# Patient Record
Sex: Male | Born: 1945 | Race: White | Hispanic: No | Marital: Married | Smoking: Never smoker
Health system: Southern US, Community
[De-identification: ages and names within clinical notes are randomized; demographics above are authoritative.]

## PROBLEM LIST (undated history)

## (undated) DIAGNOSIS — I482 Chronic atrial fibrillation, unspecified: Secondary | ICD-10-CM

## (undated) DIAGNOSIS — I639 Cerebral infarction, unspecified: Secondary | ICD-10-CM

## (undated) DIAGNOSIS — E663 Overweight: Secondary | ICD-10-CM

## (undated) DIAGNOSIS — J302 Other seasonal allergic rhinitis: Secondary | ICD-10-CM

## (undated) DIAGNOSIS — I10 Essential (primary) hypertension: Secondary | ICD-10-CM

## (undated) DIAGNOSIS — I429 Cardiomyopathy, unspecified: Secondary | ICD-10-CM

## (undated) HISTORY — DX: Overweight: E66.3

## (undated) HISTORY — DX: Other seasonal allergic rhinitis: J30.2

## (undated) HISTORY — DX: Essential (primary) hypertension: I10

## (undated) HISTORY — DX: Chronic atrial fibrillation, unspecified: I48.20

## (undated) HISTORY — DX: Cerebral infarction, unspecified: I63.9

## (undated) HISTORY — DX: Cardiomyopathy, unspecified: I42.9

---

## 2001-02-08 LAB — HM COLONOSCOPY: HM Colonoscopy: NORMAL

## 2011-05-10 ENCOUNTER — Encounter: Payer: Self-pay | Admitting: Family Medicine

## 2011-05-10 ENCOUNTER — Ambulatory Visit (INDEPENDENT_AMBULATORY_CARE_PROVIDER_SITE_OTHER): Payer: Medicare Other | Admitting: Family Medicine

## 2011-05-10 VITALS — BP 132/80 | HR 84 | Temp 98.2°F | Resp 12 | Ht 67.0 in | Wt 209.0 lb

## 2011-05-10 DIAGNOSIS — I10 Essential (primary) hypertension: Secondary | ICD-10-CM | POA: Diagnosis not present

## 2011-05-10 DIAGNOSIS — I4891 Unspecified atrial fibrillation: Secondary | ICD-10-CM | POA: Diagnosis not present

## 2011-05-10 DIAGNOSIS — Z0000 Encounter for general adult medical examination without abnormal findings: Secondary | ICD-10-CM

## 2011-05-10 LAB — CBC WITH DIFFERENTIAL/PLATELET
Basophils Absolute: 0 10*3/uL (ref 0.0–0.1)
Eosinophils Absolute: 0.2 10*3/uL (ref 0.0–0.7)
Eosinophils Relative: 2.8 % (ref 0.0–5.0)
HCT: 45.4 % (ref 39.0–52.0)
Lymphs Abs: 1.5 10*3/uL (ref 0.7–4.0)
MCHC: 32.3 g/dL (ref 30.0–36.0)
MCV: 85.8 fl (ref 78.0–100.0)
Monocytes Absolute: 0.8 10*3/uL (ref 0.1–1.0)
Neutrophils Relative %: 66.9 % (ref 43.0–77.0)
Platelets: 192 10*3/uL (ref 150.0–400.0)
RDW: 16.5 % — ABNORMAL HIGH (ref 11.5–14.6)
WBC: 7.7 10*3/uL (ref 4.5–10.5)

## 2011-05-10 LAB — BASIC METABOLIC PANEL
Calcium: 9.3 mg/dL (ref 8.4–10.5)
GFR: 69.48 mL/min (ref >60.00)
Glucose, Bld: 95 mg/dL (ref 70–99)
Potassium: 4.9 mEq/L (ref 3.5–5.1)
Sodium: 138 mEq/L (ref 135–145)

## 2011-05-10 LAB — LDL CHOLESTEROL, DIRECT: Direct LDL: 97.4 mg/dL

## 2011-05-10 LAB — LIPID PANEL
HDL: 35 mg/dL — ABNORMAL LOW (ref >39.00)
Triglycerides: 227 mg/dL — ABNORMAL HIGH (ref 0.0–149.0)
VLDL: 45.4 mg/dL — ABNORMAL HIGH (ref 0.0–40.0)

## 2011-05-10 MED ORDER — LISINOPRIL-HYDROCHLOROTHIAZIDE 10-12.5 MG PO TABS: 1.0000 | ORAL_TABLET | Freq: Every day | ORAL | Status: DC

## 2011-05-10 MED ORDER — ATENOLOL 100 MG PO TABS: 100.0000 mg | ORAL_TABLET | Freq: Every day | ORAL | Status: DC

## 2011-05-10 MED ORDER — WARFARIN SODIUM 5 MG PO TABS: 5.0000 mg | ORAL_TABLET | Freq: Every day | ORAL | Status: DC

## 2011-05-10 NOTE — Progress Notes (Signed)
  Subjective:    Patient ID: Jose Bonilla, male    DOB: October 13, 1945, 66 y.o.   MRN: [id number]  HPI  New patient to establish care. Past medical history reviewed. Treated for several years for hypertension currently with atenolol 100 mg daily and lisinopril HCTZ 10-12.5 one daily. Blood pressure parents been well-controlled. He has occasional palpitations and sense of irregularity of heartbeat-for several years. No associated dizziness or syncope. No chest pain. He has never been diagnosed with any specific arrhythmia. He denies prior surgery. No history of CAD or peripheral vascular disease.  Patient is married. No children. Works in Armed forces training and education officer. No history of smoking. No alcohol use.  Family history significant for mother with coronary disease and father with hypertension.  Past Medical History  Diagnosis Date  . Allergy   . Hypertension    History reviewed. No pertinent past surgical history.  reports that he has never smoked. He does not have any smokeless tobacco history on file. His alcohol and drug histories not on file. family history includes Diabetes in his paternal aunt; Heart disease in his mother; and Hypertension in his father. No Known Allergies    Review of Systems  Constitutional: Negative for appetite change and unexpected weight change.  Respiratory: Negative for cough, chest tightness and shortness of breath.   Cardiovascular: Positive for palpitations. Negative for chest pain and leg swelling.  Gastrointestinal: Negative for abdominal pain and blood in stool.  Genitourinary: Negative for dysuria and hematuria.  Neurological: Negative for dizziness, syncope and weakness.  Psychiatric/Behavioral: Negative for confusion.       Objective:   Physical Exam  Constitutional: He is oriented to person, place, and time. He appears well-developed and well-nourished.  HENT:  Mouth/Throat: Oropharynx is clear and moist.  Neck: Neck supple. No  thyromegaly present.  Cardiovascular: Exam reveals no gallop.        Irregular rhythm. Rate 84 by palpation  Pulmonary/Chest: Effort normal and breath sounds normal. No respiratory distress. He has no wheezes. He has no rales.  Abdominal: Soft. There is no tenderness.  Musculoskeletal: He exhibits no edema.  Lymphadenopathy:    He has no cervical adenopathy.  Neurological: He is alert and oriented to person, place, and time. No cranial nerve deficit.  Psychiatric: He has a normal mood and affect. His behavior is normal.          Assessment & Plan:  #1 hypertension. Stable. Refill medications for one year. Suggest consider atenolol 100 mg one half tablet twice daily rather than once daily because of relatively short half-life  #2 atrial fibrillation which is a new diagnosis. This was picked up on exam and EKG. Major risk factor is hypertension. He has not had any history of congestive heart failure, stroke, or diabetes. Obtain screening labs including electrolytes and thyroid function. Cardiology referral. Consider Coumadin initiation. Reassess in 5 days.

## 2011-05-10 NOTE — Patient Instructions (Signed)
Atrial Fibrillation Your caregiver has diagnosed you with atrial fibrillation (AFib). The heart normally beats very regularly; AFib is a type of irregular heartbeat. The heart rate may be faster or slower than normal. This can prevent your heart from pumping as well as it should. AFib can be constant (chronic) or intermittent (paroxysmal). CAUSES  Atrial fibrillation may be caused by:  Heart disease, including heart attack, coronary artery disease, heart failure, diseases of the heart valves, and others.   Blood clot in the lungs (pulmonary embolism).   Pneumonia or other infections.   Chronic lung disease.   Thyroid disease.   Toxins. These include alcohol, some medications (such as decongestant medications or diet pills), and caffeine.  In some people, no cause for AFib can be found. This is referred to as Lone Atrial Fibrillation. SYMPTOMS   Palpitations or a fluttering in your chest.   A vague sense of chest discomfort.   Shortness of breath.   Sudden onset of lightheadedness or weakness.  Sometimes, the first sign of AFib can be a complication of the condition. This could be a stroke or heart failure. DIAGNOSIS  Your description of your condition may make your caregiver suspicious of atrial fibrillation. Your caregiver will examine your pulse to determine if fibrillation is present. An EKG (electrocardiogram) will confirm the diagnosis. Further testing may help determine what caused you to have atrial fibrillation. This may include chest x-ray, echocardiogram, blood tests, or CT scans. PREVENTION  If you have previously had atrial fibrillation, your caregiver may advise you to avoid substances known to cause the condition (such as stimulant medications, and possibly caffeine or alcohol). You may be advised to use medications to prevent recurrence. Proper treatment of any underlying condition is important to help prevent recurrence. PROGNOSIS  Atrial fibrillation does tend to  become a chronic condition over time. It can cause significant complications (see below). Atrial fibrillation is not usually immediately life-threatening, but it can shorten your life expectancy. This seems to be worse in women. If you have lone atrial fibrillation and are under 60 years old, the risk of complications is very low, and life expectancy is not shortened. RISKS AND COMPLICATIONS  Complications of atrial fibrillation can include stroke, chest pain, and heart failure. Your caregiver will recommend treatments for the atrial fibrillation, as well as for any underlying conditions, to help minimize risk of complications. TREATMENT  Treatment for AFib is divided into several categories:  Treatment of any underlying condition.   Converting you out of AFib into a regular (sinus) rhythm.   Controlling rapid heart rate.   Prevention of blood clots and stroke.  Medications and procedures are available to convert your atrial fibrillation to sinus rhythm. However, recent studies have shown that this may not offer you any advantage, and cardiac experts are continuing research and debate on this topic. More important is controlling your rapid heartbeat. The rapid heartbeat causes more symptoms, and places strain on your heart. Your caregiver will advise you on the use of medications that can control your heart rate. Atrial fibrillation is a strong stroke risk. You can lessen this risk by taking blood thinning medications such as Coumadin (warfarin), or sometimes aspirin. These medications need close monitoring by your caregiver. Over-medication can cause bleeding. Too little medication may not protect against stroke. HOME CARE INSTRUCTIONS   If your caregiver prescribed medicine to make your heartbeat more normally, take as directed.   If blood thinners were prescribed by your caregiver, take EXACTLY as directed.     Perform blood tests EXACTLY as directed.   Quit smoking. Smoking increases your  cardiac and lung (pulmonary) risks.   DO NOT drink alcohol.   DO NOT drink caffeinated drinks (e.g. coffee, soda, chocolate, and leaf teas). You may drink decaffeinated coffee, soda or tea.   If you are overweight, you should choose a reduced calorie diet to lose weight. Please see a registered dietitian if you need more information about healthy weight loss. DO NOT USE DIET PILLS as they may aggravate heart problems.   If you have other heart problems that are causing AFib, you may need to eat a low salt, fat, and cholesterol diet. Your caregiver will tell you if this is necessary.   Exercise every day to improve your physical fitness. Stay active unless advised otherwise.   If your caregiver has given you a follow-up appointment, it is very important to keep that appointment. Not keeping the appointment could result in heart failure or stroke. If there is any problem keeping the appointment, you must call back to this facility for assistance.  SEEK MEDICAL CARE IF:  You notice a change in the rate, rhythm or strength of your heartbeat.   You develop an infection or any other change in your overall health status.  SEEK IMMEDIATE MEDICAL CARE IF:   You develop chest pain, abdominal pain, sweating, weakness or feel sick to your stomach (nausea).   You develop shortness of breath.   You develop swollen feet and ankles.   You develop dizziness, numbness, or weakness of your face or limbs, or any change in vision or speech.  MAKE SURE YOU:   Understand these instructions.   Will watch your condition.   Will get help right away if you are not doing well or get worse.  Document Released: 01/22/2005 Document Revised: 01/11/2011 Document Reviewed: 08/27/2007 [employer] Patient Information 2012 [employer]. 

## 2011-05-11 NOTE — Progress Notes (Signed)
Quick Note:  Pt informed, copy mailed to home ______ 

## 2011-05-16 ENCOUNTER — Encounter: Payer: Self-pay | Admitting: Family Medicine

## 2011-05-16 ENCOUNTER — Ambulatory Visit (INDEPENDENT_AMBULATORY_CARE_PROVIDER_SITE_OTHER): Payer: Medicare Other | Admitting: Family Medicine

## 2011-05-16 VITALS — BP 110/80 | Temp 97.8°F | Wt 209.0 lb

## 2011-05-16 DIAGNOSIS — I4891 Unspecified atrial fibrillation: Secondary | ICD-10-CM | POA: Diagnosis not present

## 2011-05-16 DIAGNOSIS — I10 Essential (primary) hypertension: Secondary | ICD-10-CM | POA: Diagnosis not present

## 2011-05-16 NOTE — Progress Notes (Signed)
  Subjective:    Patient ID: Jose Bonilla, male    DOB: 02/24/1945, 66 y.o.   MRN: [id number]  HPI  Here for followup atrial fibrillation. Recently established as new patient last week. Incidentally noted to be in atrial fibrillation. Has had some sense of palpitations off and on in the past. Denied chest pains, dyspnea, or dizziness. EKG confirmed atrial fibrillation. Lab work was basically unremarkable. He has followup with cardiology in May. No prior history of known atrial fibrillation. He has had several years of treated hypertension. No history of diabetes.  No CHF history  Patient gives history today that he had severely elevated blood pressure about 15 years ago and had CAT scan in another city [facility]) which may have shown evidence for old CVA. He describes an event where he had some acute confusion and question of TIA prompting that CT scan. He has not had any recent symptoms whatsoever concerning for TIA or CVA. Patient has no history of known valvular heart disease or CAD history.  Past Medical History  Diagnosis Date  . Allergy   . Hypertension    No past surgical history on file.  reports that he has never smoked. He does not have any smokeless tobacco history on file. His alcohol and drug histories not on file. family history includes Diabetes in his paternal aunt; Heart disease in his mother; and Hypertension in his father. No Known Allergies    Review of Systems  Constitutional: Negative for fatigue.  Eyes: Negative for visual disturbance.  Respiratory: Negative for cough, chest tightness and shortness of breath.   Cardiovascular: Positive for palpitations. Negative for chest pain and leg swelling.  Neurological: Negative for dizziness, syncope, weakness, light-headedness and headaches.       Objective:   Physical Exam  Constitutional: He appears well-developed and well-nourished.  Neck: Neck supple. No thyromegaly present.  Cardiovascular: Exam reveals no  gallop.   No murmur heard.      Irregular rhythm but rate controlled  Pulmonary/Chest: Effort normal and breath sounds normal. No respiratory distress. He has no wheezes. He has no rales.  Musculoskeletal: He exhibits no edema.          Assessment & Plan:  Atrial fibrillation of unknown duration currently rate controlled. Initially we considered CHADs score of 1 but with hx above ?prior cerebrovascular event.  Would maintain Coumadin for now. INR 1.7 today. Increase Coumadin to 7.5 mg Monday Wednesday and Friday and 2.5 mg all other days. Recheck in one week. Dietary factors discussed. Cardiology appointment pending  Hypertension well controlled on atenolol and lisinopril HCTZ

## 2011-05-16 NOTE — Patient Instructions (Addendum)
Increase warfarin 5 mg to one and one half tablet (7.5mg ) every Monday, Wednesday, and Friday and continue 1 tablet all other days Followup in one week to repeat INR

## 2011-05-23 ENCOUNTER — Ambulatory Visit: Payer: Medicare Other

## 2011-05-30 ENCOUNTER — Ambulatory Visit (INDEPENDENT_AMBULATORY_CARE_PROVIDER_SITE_OTHER): Payer: Medicare Other

## 2011-05-30 DIAGNOSIS — I4891 Unspecified atrial fibrillation: Secondary | ICD-10-CM | POA: Diagnosis not present

## 2011-05-30 DIAGNOSIS — Z7901 Long term (current) use of anticoagulants: Secondary | ICD-10-CM

## 2011-05-30 NOTE — Patient Instructions (Signed)
  Latest dosing instructions   Total Sun Mon Tue Wed Thu Fri Sat   0                         

## 2011-06-01 ENCOUNTER — Ambulatory Visit (INDEPENDENT_AMBULATORY_CARE_PROVIDER_SITE_OTHER): Payer: Medicare Other | Admitting: Family Medicine

## 2011-06-01 DIAGNOSIS — I4891 Unspecified atrial fibrillation: Secondary | ICD-10-CM | POA: Diagnosis not present

## 2011-06-01 NOTE — Patient Instructions (Addendum)
  Latest dosing instructions   Total Sun Mon Tue Wed Thu Fri Sat   35 5 mg 5 mg 5 mg 5 mg 5 mg 5 mg 5 mg    (5 mg1) (5 mg1) (5 mg1) (5 mg1) (5 mg1) (5 mg1) (5 mg1)        

## 2011-06-06 ENCOUNTER — Ambulatory Visit (INDEPENDENT_AMBULATORY_CARE_PROVIDER_SITE_OTHER): Payer: Medicare Other | Admitting: Family Medicine

## 2011-06-06 DIAGNOSIS — I4891 Unspecified atrial fibrillation: Secondary | ICD-10-CM | POA: Diagnosis not present

## 2011-06-06 NOTE — Patient Instructions (Signed)
  Latest dosing instructions   Total Sun Mon Tue Wed Thu Fri Sat   27.5 5 mg 2.5 mg 5 mg 2.5 mg 5 mg 2.5 mg 5 mg    (5 mg1) (5 mg0.5) (5 mg1) (5 mg0.5) (5 mg1) (5 mg0.5) (5 mg1)        

## 2011-06-11 ENCOUNTER — Telehealth: Payer: Self-pay | Admitting: Family Medicine

## 2011-06-11 ENCOUNTER — Ambulatory Visit (INDEPENDENT_AMBULATORY_CARE_PROVIDER_SITE_OTHER): Payer: Medicare Other | Admitting: Family Medicine

## 2011-06-11 DIAGNOSIS — M109 Gout, unspecified: Secondary | ICD-10-CM

## 2011-06-11 MED ORDER — LISINOPRIL 20 MG PO TABS: 20.0000 mg | ORAL_TABLET | Freq: Every day | ORAL | Status: DC

## 2011-06-11 MED ORDER — PREDNISONE 10 MG PO TABS: ORAL_TABLET | ORAL | Status: DC

## 2011-06-11 NOTE — Patient Instructions (Signed)
Gout Gout is an inflammatory condition (arthritis) caused by a buildup of uric acid crystals in the joints. Uric acid is a chemical that is normally present in the blood. Under some circumstances, uric acid can form into crystals in your joints. This causes joint redness, soreness, and swelling (inflammation). Repeat attacks are common. Over time, uric acid crystals can form into masses (tophi) near a joint, causing disfigurement. Gout is treatable and often preventable. CAUSES  The disease begins with elevated levels of uric acid in the blood. Uric acid is produced by your body when it breaks down a naturally found substance called purines. This also happens when you eat certain foods such as meats and fish. Causes of an elevated uric acid level include:  Being passed down from parent to child (heredity).   Diseases that cause increased uric acid production (obesity, psoriasis, some cancers).   Excessive alcohol use.   Diet, especially diets rich in meat and seafood.   Medicines, including certain cancer-fighting drugs (chemotherapy), diuretics, and aspirin.   Chronic kidney disease. The kidneys are no longer able to remove uric acid well.   Problems with metabolism.  Conditions strongly associated with gout include:  Obesity.   High blood pressure.   High cholesterol.   Diabetes.  Not everyone with elevated uric acid levels gets gout. It is not understood why some people get gout and others do not. Surgery, joint injury, and eating too much of certain foods are some of the factors that can lead to gout. SYMPTOMS   An attack of gout comes on quickly. It causes intense pain with redness, swelling, and warmth in a joint.   Fever can occur.   Often, only one joint is involved. Certain joints are more commonly involved:   Base of the big toe.   Knee.   Ankle.   Wrist.   Finger.  Without treatment, an attack usually goes away in a few days to weeks. Between attacks, you  usually will not have symptoms, which is different from many other forms of arthritis. DIAGNOSIS  Your caregiver will suspect gout based on your symptoms and exam. Removal of fluid from the joint (arthrocentesis) is done to check for uric acid crystals. Your caregiver will give you a medicine that numbs the area (local anesthetic) and use a needle to remove joint fluid for exam. Gout is confirmed when uric acid crystals are seen in joint fluid, using a special microscope. Sometimes, blood, urine, and X-ray tests are also used. TREATMENT  There are 2 phases to gout treatment: treating the sudden onset (acute) attack and preventing attacks (prophylaxis). Treatment of an Acute Attack  Medicines are used. These include anti-inflammatory medicines or steroid medicines.   An injection of steroid medicine into the affected joint is sometimes necessary.   The painful joint is rested. Movement can worsen the arthritis.   You may use warm or cold treatments on painful joints, depending which works best for you.   Discuss the use of coffee, vitamin C, or cherries with your caregiver. These may be helpful treatment options.  Treatment to Prevent Attacks After the acute attack subsides, your caregiver may advise prophylactic medicine. These medicines either help your kidneys eliminate uric acid from your body or decrease your uric acid production. You may need to stay on these medicines for a very long time. The early phase of treatment with prophylactic medicine can be associated with an increase in acute gout attacks. For this reason, during the first few months   of treatment, your caregiver may also advise you to take medicines usually used for acute gout treatment. Be sure you understand your caregiver's directions. You should also discuss dietary treatment with your caregiver. Certain foods such as meats and fish can increase uric acid levels. Other foods such as dairy can decrease levels. Your caregiver  can give you a list of foods to avoid. HOME CARE INSTRUCTIONS   Do not take aspirin to relieve pain. This raises uric acid levels.   Only take over-the-counter or prescription medicines for pain, discomfort, or fever as directed by your caregiver.   Rest the joint as much as possible. When in bed, keep sheets and blankets off painful areas.   Keep the affected joint raised (elevated).   Use crutches if the painful joint is in your leg.   Drink enough water and fluids to keep your urine clear or pale yellow. This helps your body get rid of uric acid. Do not drink alcoholic beverages. They slow the passage of uric acid.   Follow your caregiver's dietary instructions. Pay careful attention to the amount of protein you eat. Your daily diet should emphasize fruits, vegetables, whole grains, and fat-free or low-fat milk products.   Maintain a healthy body weight.  SEEK MEDICAL CARE IF:   You have an oral temperature above 102 F (38.9 C).   You develop diarrhea, vomiting, or any side effects from medicines.   You do not feel better in 24 hours, or you are getting worse.  SEEK IMMEDIATE MEDICAL CARE IF:   Your joint becomes suddenly more tender and you have:   Chills.   An oral temperature above 102 F (38.9 C), not controlled by medicine.  MAKE SURE YOU:   Understand these instructions.   Will watch your condition.   Will get help right away if you are not doing well or get worse.  Document Released: 01/20/2000 Document Revised: 01/11/2011 Document Reviewed: 05/02/2009 [employer] Patient Information 2012 [employer]. 

## 2011-06-11 NOTE — Telephone Encounter (Signed)
Flair up of pain in Let wrist and is interested in a referral. Please contact

## 2011-06-11 NOTE — Telephone Encounter (Signed)
Please see if pt would be willing to return for OV to re-evaluate and refer if needed

## 2011-06-11 NOTE — Progress Notes (Signed)
  Subjective:    Patient ID: Jose Bonilla, male    DOB: 1945-07-01, 66 y.o.   MRN: [id number]  HPI  Acute visit. Left wrist pain and swelling for the past week. No injury. Denies any fever or chills. He relates prior history of gout. Has had previous elbow involvement and foot involvement and apparently similar left wrist involvement over a year ago. Was seen then by orthopedist and received steroid injection and this eventually improved. Pain is moderate. Has not taken any nonsteroidals other than occasional Advil. He takes lisinopril HCTZ for hypertension and has been on this for quite some time.  No dietary changes. No alcohol.  Recently diagnosed atrial fibrillation. On Coumadin. Recent INR 3.3 with adjustment in Coumadin dose. No bleeding complications. Denies any chest pains, dyspnea, or dizziness. No palpitations. Cardiology followup pending.  Past Medical History  Diagnosis Date  . Allergy   . Hypertension    No past surgical history on file.  reports that he has never smoked. He does not have any smokeless tobacco history on file. His alcohol and drug histories not on file. family history includes Diabetes in his paternal aunt; Heart disease in his mother; and Hypertension in his father. No Known Allergies    Review of Systems  Constitutional: Negative for fever and chills.  Respiratory: Negative for cough and shortness of breath.   Cardiovascular: Negative for chest pain, palpitations and leg swelling.  Hematological: Does not bruise/bleed easily.       Objective:   Physical Exam  Constitutional: He appears well-developed and well-nourished.  Cardiovascular: Normal rate.        Irregularly irregular rhythm  Pulmonary/Chest: Effort normal and breath sounds normal. No respiratory distress. He has no wheezes. He has no rales.  Musculoskeletal:       Patient has diffuse swelling of the left wrist with mild warmth but no erythema. He has some edema extending into the dorsum of  the left hand. Minimally tender to palpation of the wrist. He has some left olecranon swelling with possibly some early tophaceous changes no cellulitis changes. Elbow is nontender          Assessment & Plan:  Acute monoarticular arthritis left wrist.  Very likely gout. No evidence for infection such as cellulitis. Discontinue lisinopril HCTZ and change to lisinopril 20 mg (eliminating HCTZ which could trigger gout). Prednisone taper over 8 days. May need to consider medication such as Uloric if continues to have frequent flareups

## 2011-06-12 ENCOUNTER — Encounter: Payer: Self-pay | Admitting: Family Medicine

## 2011-06-19 ENCOUNTER — Telehealth: Payer: Self-pay | Admitting: *Deleted

## 2011-06-19 ENCOUNTER — Ambulatory Visit (INDEPENDENT_AMBULATORY_CARE_PROVIDER_SITE_OTHER): Payer: Medicare Other | Admitting: Cardiology

## 2011-06-19 ENCOUNTER — Encounter: Payer: Self-pay | Admitting: Cardiology

## 2011-06-19 VITALS — BP 119/83 | HR 81 | Resp 18 | Ht 68.0 in | Wt 225.0 lb

## 2011-06-19 DIAGNOSIS — I4891 Unspecified atrial fibrillation: Secondary | ICD-10-CM

## 2011-06-19 DIAGNOSIS — I10 Essential (primary) hypertension: Secondary | ICD-10-CM

## 2011-06-19 DIAGNOSIS — M1A9XX0 Chronic gout, unspecified, without tophus (tophi): Secondary | ICD-10-CM | POA: Insufficient documentation

## 2011-06-19 DIAGNOSIS — Z7901 Long term (current) use of anticoagulants: Secondary | ICD-10-CM

## 2011-06-19 DIAGNOSIS — I639 Cerebral infarction, unspecified: Secondary | ICD-10-CM

## 2011-06-19 DIAGNOSIS — I6350 Cerebral infarction due to unspecified occlusion or stenosis of unspecified cerebral artery: Secondary | ICD-10-CM

## 2011-06-19 DIAGNOSIS — E663 Overweight: Secondary | ICD-10-CM | POA: Insufficient documentation

## 2011-06-19 DIAGNOSIS — R609 Edema, unspecified: Secondary | ICD-10-CM | POA: Insufficient documentation

## 2011-06-19 MED ORDER — DILTIAZEM HCL ER COATED BEADS 120 MG PO CP24: 120.0000 mg | ORAL_CAPSULE | Freq: Every day | ORAL | Status: DC

## 2011-06-19 NOTE — Assessment & Plan Note (Signed)
Patient was placed on Coumadin in April, 2013 when his diagnosis of atrial fib was made. It will be most appropriate for him to get his INRs checked in our Coumadin clinic as it is close to his home. As I assess him further we will decide if cardioversion is to be undertaken.

## 2011-06-19 NOTE — Assessment & Plan Note (Addendum)
We have no idea how long he is in atrial fibrillation. Some of his current symptoms may be related increased atrial fibrillation rates. Going add a small dose of diltiazem today to see how he feels. Hold and assess him further and make decisions about leaving him in atrial fib or trying to convert him. Two-dimensional echo will be done to assess LV function along with valvular function.

## 2011-06-19 NOTE — Progress Notes (Signed)
HPI   Patient is seen today as a new patient evaluation for atrial fibrillation. The patient was seen Dr. Caryl Never to establish as a new primary care patient. It was noted that he had atrial fibrillation. He was started on Coumadin and plans were made for him to be seen here. He actually lives in [place]. He is self-employed. He gives a history of the question of an old CVA documented by CT scan of the head in 2001. He is not aware of any prior EKGs that would show atrial fibrillation. His TSH was checked recently and it is normal. We do not have any data concerning his LV function and valvular function at this time. He has not had PND orthopnea. He does note that when he tries to do any type of physical exercise that he notes an increase in his heart rate and he becomes fatigued. He cannot say how long this has been going on.  As part of today's evaluation I have reviewed the old records available including the notes from his primary physician.  Very recently he had gout in the left hand.. He's been treated with steroids with the last dose yesterday. He did develop edema associated with the timing of the steroids.   No Known Allergies  Current Outpatient Prescriptions  Medication Sig Dispense Refill  . atenolol (TENORMIN) 100 MG tablet Take 1 tablet (100 mg total) by mouth daily.  90 tablet  3  . lisinopril-hydrochlorothiazide (PRINZIDE,ZESTORETIC) 10-12.5 MG per tablet Take 1 tablet by mouth daily.      Marland Kitchen warfarin (COUMADIN) 5 MG tablet Take 1 tablet (5 mg total) by mouth daily.  30 tablet  3    History   Social History  . Marital Status: Married    Spouse Name: N/A    Number of Children: N/A  . Years of Education: N/A   Occupational History  . Not on file.   Social History Main Topics  . Smoking status: Never Smoker   . Smokeless tobacco: Not on file  . Alcohol Use: Not on file  . Drug Use: Not on file  . Sexually Active: Not on file   Other Topics Concern  . Not on file    Social History Narrative  . No narrative on file    Family History  Problem Relation Age of Onset  . Heart disease Mother   . Hypertension Father   . Diabetes Paternal Aunt     Past Medical History  Diagnosis Date  . Allergy   . Hypertension   . Warfarin anticoagulation     Atrial fibrillation  . Atrial fibrillation     Coumadin therapy  . CVA (cerebral infarction)     Question  CT scan elsewhere  with possible old CVA ,  CT scan done  for acute confusion and question of TIA in the past  . Gout     Acute episode May, 2013    No past surgical history on file.  ROS   Patient denies fever, chills, headache, sweats, rash, change in vision, change in hearing, chest pain, cough, nausea vomiting, urinary symptoms. All other systems are reviewed and are negative.  PHYSICAL EXAM   Patient is overweight. He is oriented to person time and place. Affect is normal. There is no jugulovenous distention. There no carotid bruits. Lungs are clear. Respiratory effort is nonlabored. Cardiac exam reveals that his rhythm is irregularly irregular. The abdomen is soft.  He does have some peripheral edema. There are  no musculoskeletal deformities. There are no skin rashes.  Filed Vitals:   06/19/11 1419  BP: 119/83  Pulse: 81  Resp: 18  Height: 5\' 8"  (1.727 m)  Weight: 225 lb (102.059 kg)   I personally reviewed the EKG dated May 10, 2011. This did show atrial fibrillation.  ASSESSMENT & PLAN

## 2011-06-19 NOTE — Assessment & Plan Note (Signed)
The CT scan question an old CVA. This CT scan was done 2001. I am presuming that atrial fibrillation was not seen at that time. However  He could of been having paroxysmal atrial fib back then. Certainly Coumadin is indicated.

## 2011-06-19 NOTE — Assessment & Plan Note (Signed)
Blood pressure is under reasonable control. No change in therapy for blood pressure at this time.

## 2011-06-19 NOTE — Assessment & Plan Note (Signed)
He has some edema now. He thinks that developed when he went on the prednisone. It is over stopped yesterday. I will not change his meds at this time. He does have a small dose of diuretic in his current meds.

## 2011-06-19 NOTE — Telephone Encounter (Signed)
MD informed and said it was okay for patient to take the both of them. [facility] pharmacist informed.

## 2011-06-19 NOTE — Assessment & Plan Note (Signed)
He definitely needs to lose weight in general.

## 2011-06-19 NOTE — Patient Instructions (Signed)
Your physician recommends that you schedule a follow-up appointment in: 3-4 weeks with Dr. Myrtis Ser. You will be given an appointment at the check out desk today.   Your physician has recommended you make the following change in your medication: START DILTIAZEM CD 120 MG DAILY. Your new prescription has been sent to your pharmacy.  You have been referred to our Coumadin Clinic. You will be given an appointment for this at the check out desk today.  Your physician has requested that you have an echocardiogram. Echocardiography is a painless test that uses sound waves to create images of your heart. It provides your doctor with information about the size and shape of your heart and how well your heart's chambers and valves are working. This procedure takes approximately one hour. There are no restrictions for this procedure.

## 2011-06-20 ENCOUNTER — Ambulatory Visit: Payer: Medicare Other

## 2011-06-20 ENCOUNTER — Ambulatory Visit: Payer: Medicare Other | Admitting: Family Medicine

## 2011-06-21 ENCOUNTER — Encounter: Payer: Self-pay | Admitting: Family Medicine

## 2011-06-21 ENCOUNTER — Ambulatory Visit (INDEPENDENT_AMBULATORY_CARE_PROVIDER_SITE_OTHER): Payer: Medicare Other | Admitting: Family Medicine

## 2011-06-21 VITALS — BP 120/90 | Temp 97.8°F | Wt 226.0 lb

## 2011-06-21 DIAGNOSIS — Z7901 Long term (current) use of anticoagulants: Secondary | ICD-10-CM | POA: Diagnosis not present

## 2011-06-21 DIAGNOSIS — M109 Gout, unspecified: Secondary | ICD-10-CM

## 2011-06-21 DIAGNOSIS — I4891 Unspecified atrial fibrillation: Secondary | ICD-10-CM | POA: Diagnosis not present

## 2011-06-21 NOTE — Progress Notes (Signed)
  Subjective:    Patient ID: Jose Bonilla, male    DOB: 11/25/1945, 66 y.o.   MRN: [id number]  HPI  Patient seen initially for INR check. He explains had some generalized weakness. Was recent seen by cardiologist with addition of diltiazem to atenolol. He also takes lisinopril HCTZ for hypertension. No orthostasis. We recently reduced his Coumadin dosage. His INR today is back up to 8 and he states he is taking Coumadin as instructed. He was placed on prednisone recently for gout flare but no recent antibiotics. No bleeding complications. Patient denies any headaches, abdominal pain, hematuria, bloody stools. No dizziness. Generally more fatigued. No chest pain. No dyspnea. Recent lab work including thyroid function unremarkable. He is scheduled for echocardiogram later this month.  Recently had painful swollen left wrist with suspected gout. This improved promptly with prednisone.  Past Medical History  Diagnosis Date  . Allergy   . Hypertension   . Warfarin anticoagulation     Atrial fibrillation  . Atrial fibrillation     Coumadin therapy  . CVA (cerebral infarction)     Question  CT scan elsewhere  with possible old CVA ,  CT scan done  for acute confusion and question of TIA in the past  . Gout     Acute episode May, 2013  . Overweight   . Edema     May, 2013   No past surgical history on file.  reports that he has never smoked. He does not have any smokeless tobacco history on file. His alcohol and drug histories not on file. family history includes Diabetes in his paternal aunt; Heart disease in his mother; and Hypertension in his father. No Known Allergies    Review of Systems  Constitutional: Positive for fatigue. Negative for fever and chills.  Respiratory: Negative for cough and shortness of breath.   Cardiovascular: Negative for chest pain.  Gastrointestinal: Negative for abdominal pain and blood in stool.  Genitourinary: Negative for hematuria.  Neurological:  Negative for dizziness, syncope, weakness and headaches.  Hematological: Negative for adenopathy. Does not bruise/bleed easily.       Objective:   Physical Exam  Constitutional: He is oriented to person, place, and time. He appears well-developed and well-nourished.  Neck: Neck supple. No thyromegaly present.  Cardiovascular: Normal rate and regular rhythm.   Pulmonary/Chest: Effort normal and breath sounds normal. No respiratory distress. He has no wheezes. He has no rales.  Musculoskeletal:       L olecranon bursa edema but no warmth.  Tophaceous changes.  Neurological: He is alert and oriented to person, place, and time.  Skin: No rash noted.          Assessment & Plan:  # atrial fibrillation. Rate controlled. Coumadin is supratherapeutic. We will hold Coumadin altogether until Monday and recheck then. #2 gout. Improved following prednisone.  Discussed possible discontinuation of HCTZ but he is reluctant b/o chronic edema issues.  May need to consider allopurinol or uloric. #3 generalized fatigue. Patient has questions today regarding further cardiac testing. Denies specific chest pain but has concerns regarding coronary artery disease. Will discuss with cardiologist.  He is already scheduled for ECHO.

## 2011-06-21 NOTE — Patient Instructions (Signed)
HOLD Coumadin until Monday and repeat INR on Monday

## 2011-06-22 ENCOUNTER — Encounter: Payer: Self-pay | Admitting: Physician Assistant

## 2011-06-25 ENCOUNTER — Encounter: Payer: Medicare Other | Admitting: Family

## 2011-06-26 ENCOUNTER — Telehealth: Payer: Self-pay | Admitting: Cardiology

## 2011-06-26 NOTE — Telephone Encounter (Signed)
Jose Bonilla has echo scheduled for this week. I have mailed a letter to patient requesting that he call the office So that the echo can be re-scheduled. Also, tried calling both telephone numbers we have on file. No mail box Has been set up.

## 2011-06-27 ENCOUNTER — Ambulatory Visit (INDEPENDENT_AMBULATORY_CARE_PROVIDER_SITE_OTHER): Payer: Medicare Other | Admitting: Family

## 2011-06-27 DIAGNOSIS — I4891 Unspecified atrial fibrillation: Secondary | ICD-10-CM | POA: Diagnosis not present

## 2011-06-27 NOTE — Patient Instructions (Signed)
  Latest dosing instructions   Total Sun Mon Tue Wed Thu Fri Sat   17.5 2.5 mg 2.5 mg 2.5 mg 2.5 mg 2.5 mg 2.5 mg 2.5 mg    (5 mg0.5) (5 mg0.5) (5 mg0.5) (5 mg0.5) (5 mg0.5) (5 mg0.5) (5 mg0.5)        

## 2011-06-28 ENCOUNTER — Other Ambulatory Visit: Payer: Medicare Other

## 2011-07-04 ENCOUNTER — Ambulatory Visit (INDEPENDENT_AMBULATORY_CARE_PROVIDER_SITE_OTHER): Payer: Medicare Other | Admitting: Family

## 2011-07-04 DIAGNOSIS — I4891 Unspecified atrial fibrillation: Secondary | ICD-10-CM | POA: Diagnosis not present

## 2011-07-04 LAB — POCT INR: INR: 1.6

## 2011-07-04 NOTE — Patient Instructions (Signed)
  Latest dosing instructions   Total Sun Mon Tue Wed Thu Fri Sat   22.5 2.5 mg 2.5 mg 5 mg 2.5 mg 5 mg 2.5 mg 2.5 mg    (5 mg0.5) (5 mg0.5) (5 mg1) (5 mg0.5) (5 mg1) (5 mg0.5) (5 mg0.5)      Take 5 mg today. Take 5 mg Tues and Thurs then take 2.5 mg every other day. Check in 1 week.

## 2011-07-11 ENCOUNTER — Ambulatory Visit: Payer: Medicare Other | Admitting: Family

## 2011-07-13 ENCOUNTER — Ambulatory Visit (INDEPENDENT_AMBULATORY_CARE_PROVIDER_SITE_OTHER): Payer: Medicare Other | Admitting: Family

## 2011-07-13 DIAGNOSIS — I4891 Unspecified atrial fibrillation: Secondary | ICD-10-CM | POA: Diagnosis not present

## 2011-07-13 LAB — POCT INR: INR: 2.8

## 2011-07-13 NOTE — Patient Instructions (Signed)
  Latest dosing instructions   Total Sun Mon Tue Wed Thu Fri Sat   22.5 2.5 mg 2.5 mg 5 mg 2.5 mg 5 mg 2.5 mg 2.5 mg    (5 mg0.5) (5 mg0.5) (5 mg1) (5 mg0.5) (5 mg1) (5 mg0.5) (5 mg0.5)        Take 5 mg Tues and Thurs.  Then take 2.5 mg every other day. Check in 3 week.

## 2011-07-19 ENCOUNTER — Other Ambulatory Visit: Payer: Self-pay

## 2011-07-19 ENCOUNTER — Other Ambulatory Visit (INDEPENDENT_AMBULATORY_CARE_PROVIDER_SITE_OTHER): Payer: Medicare Other

## 2011-07-19 DIAGNOSIS — I4891 Unspecified atrial fibrillation: Secondary | ICD-10-CM

## 2011-07-23 ENCOUNTER — Encounter: Payer: Self-pay | Admitting: Cardiology

## 2011-07-23 DIAGNOSIS — R9430 Abnormal result of cardiovascular function study, unspecified: Secondary | ICD-10-CM | POA: Insufficient documentation

## 2011-07-23 DIAGNOSIS — IMO0002 Reserved for concepts with insufficient information to code with codable children: Secondary | ICD-10-CM | POA: Insufficient documentation

## 2011-07-27 ENCOUNTER — Telehealth: Payer: Self-pay | Admitting: Cardiology

## 2011-07-27 NOTE — Telephone Encounter (Signed)
Has some questions regarding his medications and how he is feeling.  He says that he is feeling much better.

## 2011-07-30 NOTE — Telephone Encounter (Signed)
Returned call to patient and he wanted to get an appointment with Myrtis Ser to discuss recent test and medications.  Nurse called [facility]. Office and was given appointment for this Thursday 06/27 @2 :30 pm. Patient informed.

## 2011-08-02 ENCOUNTER — Ambulatory Visit (INDEPENDENT_AMBULATORY_CARE_PROVIDER_SITE_OTHER): Payer: Medicare Other | Admitting: Cardiology

## 2011-08-02 ENCOUNTER — Encounter: Payer: Self-pay | Admitting: Cardiology

## 2011-08-02 VITALS — BP 126/90 | HR 102 | Ht 68.0 in | Wt 207.0 lb

## 2011-08-02 DIAGNOSIS — I4891 Unspecified atrial fibrillation: Secondary | ICD-10-CM | POA: Diagnosis not present

## 2011-08-02 DIAGNOSIS — Z7901 Long term (current) use of anticoagulants: Secondary | ICD-10-CM

## 2011-08-02 DIAGNOSIS — I10 Essential (primary) hypertension: Secondary | ICD-10-CM

## 2011-08-02 DIAGNOSIS — R609 Edema, unspecified: Secondary | ICD-10-CM | POA: Diagnosis not present

## 2011-08-02 DIAGNOSIS — R0989 Other specified symptoms and signs involving the circulatory and respiratory systems: Secondary | ICD-10-CM

## 2011-08-02 DIAGNOSIS — R9430 Abnormal result of cardiovascular function study, unspecified: Secondary | ICD-10-CM

## 2011-08-02 DIAGNOSIS — IMO0002 Reserved for concepts with insufficient information to code with codable children: Secondary | ICD-10-CM

## 2011-08-02 MED ORDER — DILTIAZEM HCL ER COATED BEADS 180 MG PO CP24: 180.0000 mg | ORAL_CAPSULE | Freq: Every day | ORAL | Status: DC

## 2011-08-02 NOTE — Assessment & Plan Note (Signed)
Blood pressure is controlled

## 2011-08-02 NOTE — Assessment & Plan Note (Signed)
His edema is greatly improved. I suspect this is related to better rate control with his atrial fib and the low dose diuretic.

## 2011-08-02 NOTE — Assessment & Plan Note (Signed)
Atrial fibrillation rate is under better control. I still want to push his no chiasm from 120-180 mg daily. We do not know how long he has had it for fibrillation. I explained to him that I would recommend proceeding with cardioversion on one occasion. He did not seem interested in discussing this issue. He said that he was pleased that he had diuresis.

## 2011-08-02 NOTE — Assessment & Plan Note (Signed)
The echo at this point suggest decreased left ventricular function. Etiology is not clear. We need to proceed with a pharmacologic nuclear scan to be sure that there is not evidence of coronary artery disease. This will be scheduled and I will see him back.

## 2011-08-02 NOTE — Progress Notes (Signed)
HPI  Patient returns for the further evaluation of atrial fibrillation. I saw him in consultation Jun 19, 2011. He took fibrillation had been noted. I decided to put him on diltiazem for rate control and to obtain a 2-D echo. The echo was technically difficult. There is question that the ejection fraction may be in the 40-45% range. However, it is possible that the technical difficulties in the atrial fib are affecting the ability to assess the LV function. Ejection fraction may possibly be better. However this result raises the question of the possibility of coronary disease.  With better rate control the patient actually diuresed substantially.  No Known Allergies  Current Outpatient Prescriptions  Medication Sig Dispense Refill  . atenolol (TENORMIN) 100 MG tablet Take 1 tablet (100 mg total) by mouth daily.  90 tablet  3  . diltiazem (CARDIZEM CD) 120 MG 24 hr capsule Take 1 capsule (120 mg total) by mouth daily.  30 capsule  3  . lisinopril-hydrochlorothiazide (PRINZIDE,ZESTORETIC) 10-12.5 MG per tablet Take 1 tablet by mouth daily.      Marland Kitchen warfarin (COUMADIN) 5 MG tablet Take 1 tablet (5 mg total) by mouth daily.  30 tablet  3    History   Social History  . Marital Status: Married    Spouse Name: N/A    Number of Children: N/A  . Years of Education: N/A   Occupational History  . Not on file.   Social History Main Topics  . Smoking status: Never Smoker   . Smokeless tobacco: Not on file  . Alcohol Use: Not on file  . Drug Use: Not on file  . Sexually Active: Not on file   Other Topics Concern  . Not on file   Social History Narrative  . No narrative on file    Family History  Problem Relation Age of Onset  . Heart disease Mother   . Hypertension Father   . Diabetes Paternal Aunt     Past Medical History  Diagnosis Date  . Allergy   . Hypertension   . Warfarin anticoagulation     Atrial fibrillation  . Atrial fibrillation     Coumadin therapy  . CVA  (cerebral infarction)     Question  CT scan elsewhere  with possible old CVA ,  CT scan done  for acute confusion and question of TIA in the past  . Gout     Acute episode May, 2013  . Overweight   . Edema     May, 2013  . Ejection fraction < 50%     EF 40-45%, echo, June, 2013, mild RV dysfunction    No past surgical history on file.  ROS   Patient denies fever, chills, headache, sweats, rash, change in vision, change in hearing, chest pain, cough, nausea vomiting, urinary symptoms. All other systems are reviewed and are negative.  PHYSICAL EXAM  Patient is oriented to person time and place. Affect is normal. He is very pleased that he has lost weight by diuresing. Lungs are clear. Respiratory effort is not labored. There is no jugulovenous distention. Cardiac exam reveals that the rhythm is irregularly irregular. There is an S1 and S2. There no clicks or significant murmurs. The abdomen is soft. His legs are thick but there is no definite edema.There no musculoskeletal deformities. There are no skin rashes.  Filed Vitals:   08/02/11 1449  BP: 126/90  Pulse: 102  Height: 5\' 8"  (1.727 m)  Weight: 207 lb (93.895 kg)  EKG is done today and reviewed by me. He has atrial fibrillation. The rate today is approximately 100.  ASSESSMENT & PLAN

## 2011-08-02 NOTE — Assessment & Plan Note (Signed)
The patient's Coumadin is being continued. We know that his INR was 2.7 several weeks ago. He will be having his INR checked again tomorrow.

## 2011-08-02 NOTE — Patient Instructions (Addendum)
Your physician has requested that you have a lexiscan myoview. For further information please visit [email or web address] Please follow instruction sheet, as given.  Your physician has recommended you make the following change in your medication: Increase your cardizem to 180mg  daily  Your physician recommends that you schedule a follow-up appointment in: 6-8 weeks

## 2011-08-03 ENCOUNTER — Ambulatory Visit (INDEPENDENT_AMBULATORY_CARE_PROVIDER_SITE_OTHER): Payer: Medicare Other | Admitting: Family

## 2011-08-03 DIAGNOSIS — Z7901 Long term (current) use of anticoagulants: Secondary | ICD-10-CM

## 2011-08-03 DIAGNOSIS — I4891 Unspecified atrial fibrillation: Secondary | ICD-10-CM | POA: Diagnosis not present

## 2011-08-03 LAB — POCT INR: INR: 2.9

## 2011-08-03 NOTE — Patient Instructions (Signed)
Take 5 mg Tues and Thurs.  Then take 2.5 mg every other day. Check in 4 week.    Latest dosing instructions   Total Sun Mon Tue Wed Thu Fri Sat   22.5 2.5 mg 2.5 mg 5 mg 2.5 mg 5 mg 2.5 mg 2.5 mg    (5 mg0.5) (5 mg0.5) (5 mg1) (5 mg0.5) (5 mg1) (5 mg0.5) (5 mg0.5)

## 2011-08-06 ENCOUNTER — Telehealth: Payer: Self-pay | Admitting: Cardiology

## 2011-08-06 NOTE — Telephone Encounter (Signed)
New Problem:    Patient wants you to give him a call to discuss his diagnosis.  Please call back.

## 2011-08-07 NOTE — Telephone Encounter (Signed)
There is nothing else that I can tell the patient to explain any further until we see how he does with the higher dose of diltiazem and we get the results of his nuclear scan. Before you call the patient, please call me

## 2011-08-07 NOTE — Telephone Encounter (Signed)
Pt is concerned that Dr Myrtis Ser told him to ignore everything he was told regarding the results of his echocardiogram.  He wants to know what Dr Myrtis Ser meant by this statement?  He also wants to know if he has CHF.

## 2011-08-07 NOTE — Telephone Encounter (Signed)
Pt was notified that Dr Myrtis Ser ordered the nuclear test to better assess the LV function of his heart.  He was also told that Dr Myrtis Ser is pleased that he is responding well to the Diltiazem and is doing better.

## 2011-08-22 ENCOUNTER — Ambulatory Visit: Payer: Medicare Other | Admitting: Family Medicine

## 2011-08-27 ENCOUNTER — Other Ambulatory Visit (HOSPITAL_COMMUNITY): Payer: Medicare Other

## 2011-08-31 ENCOUNTER — Ambulatory Visit (INDEPENDENT_AMBULATORY_CARE_PROVIDER_SITE_OTHER): Payer: Medicare Other | Admitting: Family

## 2011-08-31 DIAGNOSIS — I4891 Unspecified atrial fibrillation: Secondary | ICD-10-CM | POA: Diagnosis not present

## 2011-08-31 LAB — POCT INR: INR: 3.8

## 2011-08-31 NOTE — Patient Instructions (Addendum)
Hold Coumadin today and tomorrow. Resume on Sunday. Then Continue to take 5 mg Tues and Thurs.  Then take 2.5 mg every other day. Check in 2 week.     Latest dosing instructions   Total Sun Mon Tue Wed Thu Fri Sat   22.5 2.5 mg 2.5 mg 5 mg 2.5 mg 5 mg 2.5 mg 2.5 mg    (5 mg0.5) (5 mg0.5) (5 mg1) (5 mg0.5) (5 mg1) (5 mg0.5) (5 mg0.5)

## 2011-09-05 ENCOUNTER — Ambulatory Visit (INDEPENDENT_AMBULATORY_CARE_PROVIDER_SITE_OTHER): Payer: Medicare Other | Admitting: Family Medicine

## 2011-09-05 ENCOUNTER — Encounter: Payer: Self-pay | Admitting: Family Medicine

## 2011-09-05 VITALS — BP 120/78 | Temp 98.2°F | Wt 212.0 lb

## 2011-09-05 DIAGNOSIS — I10 Essential (primary) hypertension: Secondary | ICD-10-CM

## 2011-09-05 DIAGNOSIS — M109 Gout, unspecified: Secondary | ICD-10-CM

## 2011-09-05 DIAGNOSIS — I4891 Unspecified atrial fibrillation: Secondary | ICD-10-CM

## 2011-09-05 NOTE — Progress Notes (Signed)
  Subjective:    Patient ID: Jose Bonilla, male    DOB: Jul 03, 1945, 66 y.o.   MRN: [id number]  HPI  Medical patient was noted to be in atrial fibrillation when first presented here. He is maintained on Coumadin as well as beta blocker and diltiazem. Rate has been well controlled. Extreme fatigue and edema issues previously and placed on low-dose diuretic with HCTZ and with better rate controlled A. fib overall feels better. No chest pains. Recent ejection fraction 45-50% and nuclear stress test pending. Maintained on Coumadin with recent slightly supratherapeutic INR 3.8. No bleeding complications.  No recent exertional chest pain.  History of gout. Had left wrist involvement recently. He has chronic probably tophaceous changes left olecranon bursa. 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 No other tophaceous changes. He is careful about diet and foods that trigger gout.  Past Medical History  Diagnosis Date  . Allergy   . Hypertension   . Warfarin anticoagulation     Atrial fibrillation  . Atrial fibrillation     Coumadin therapy  . CVA (cerebral infarction)     Question  CT scan elsewhere  with possible old CVA ,  CT scan done  for acute confusion and question of TIA in the past  . Gout     Acute episode May, 2013  . Overweight   . Edema     May, 2013  . Ejection fraction < 50%     EF 40-45%, echo, June, 2013, mild RV dysfunction   No past surgical history on file.  reports that he has never smoked. He does not have any smokeless tobacco history on file. His alcohol and drug histories not on file. family history includes Diabetes in his paternal aunt; Heart disease in his mother; and Hypertension in his father. No Known Allergies    Review of Systems  Constitutional: Negative for appetite change and unexpected weight change.  Respiratory: Negative for cough and shortness of breath.   Cardiovascular: Negative for chest pain.    Gastrointestinal: Negative for abdominal pain and blood in stool.  Genitourinary: Negative for hematuria.  Neurological: Negative for dizziness, syncope and weakness.       Objective:   Physical Exam  Constitutional: He appears well-developed and well-nourished. No distress.  Neck: Neck supple. No thyromegaly present.  Cardiovascular:       Irregular rhythm rate controlled with pulse 72-76  Pulmonary/Chest: Effort normal and breath sounds normal. No respiratory distress. He has no wheezes. He has no rales.  Musculoskeletal:       L olecranon bursa edematous with ?tophaceous changes but not skin breakdown and no active inflammation such as erythema or warmth.          Assessment & Plan:  #1 hypertension. Well controlled. Continue current medications #2 atrial fibrillation rate controlled. Continue Coumadin. Follow through Coumadin clinic.  #3 gout. Consider eventual discontinuation HCTZ if continues to have flareups. Discussed possible prophylaxis with medications such as allopurinol but at this point he wishes to observe. He has some chronic inflammatory changes left elbow which may be difficult to resolve without lowering uric acid.

## 2011-09-14 ENCOUNTER — Ambulatory Visit (INDEPENDENT_AMBULATORY_CARE_PROVIDER_SITE_OTHER): Payer: Medicare Other | Admitting: Family

## 2011-09-14 DIAGNOSIS — Z7901 Long term (current) use of anticoagulants: Secondary | ICD-10-CM | POA: Diagnosis not present

## 2011-09-14 DIAGNOSIS — I4891 Unspecified atrial fibrillation: Secondary | ICD-10-CM | POA: Diagnosis not present

## 2011-09-14 NOTE — Patient Instructions (Addendum)
Continue to take 5 mg Tues and Thurs.  Then take 2.5 mg every other day. Check in 4 week.     Latest dosing instructions   Total Sun Mon Tue Wed Thu Fri Sat   22.5 2.5 mg 2.5 mg 5 mg 2.5 mg 5 mg 2.5 mg 2.5 mg    (5 mg0.5) (5 mg0.5) (5 mg1) (5 mg0.5) (5 mg1) (5 mg0.5) (5 mg0.5)        

## 2011-09-24 ENCOUNTER — Other Ambulatory Visit: Payer: Self-pay | Admitting: Family Medicine

## 2011-10-01 ENCOUNTER — Ambulatory Visit: Payer: Medicare Other | Admitting: Cardiology

## 2011-10-12 ENCOUNTER — Ambulatory Visit (INDEPENDENT_AMBULATORY_CARE_PROVIDER_SITE_OTHER): Payer: Medicare Other | Admitting: Family

## 2011-10-12 DIAGNOSIS — I4891 Unspecified atrial fibrillation: Secondary | ICD-10-CM

## 2011-10-12 DIAGNOSIS — Z7901 Long term (current) use of anticoagulants: Secondary | ICD-10-CM | POA: Diagnosis not present

## 2011-10-12 MED ORDER — WARFARIN SODIUM 2.5 MG PO TABS: 2.5000 mg | ORAL_TABLET | Freq: Every day | ORAL | Status: DC

## 2011-10-12 NOTE — Patient Instructions (Signed)
Hold Coumadin Friday and Saturday.  Continue to take 5 mg Tues and Thurs.  Then take 2.5 mg every other day. Check in 3 week.     Latest dosing instructions   Total Sun Mon Tue Wed Thu Fri Sat   22.5 2.5 mg 2.5 mg 5 mg 2.5 mg 5 mg 2.5 mg 2.5 mg    (5 mg0.5) (5 mg0.5) (5 mg1) (5 mg0.5) (5 mg1) (5 mg0.5) (5 mg0.5)

## 2011-11-02 ENCOUNTER — Encounter: Payer: Medicare Other | Admitting: Family

## 2011-11-02 ENCOUNTER — Ambulatory Visit (INDEPENDENT_AMBULATORY_CARE_PROVIDER_SITE_OTHER): Payer: Medicare Other | Admitting: Family

## 2011-11-02 DIAGNOSIS — I4891 Unspecified atrial fibrillation: Secondary | ICD-10-CM

## 2011-11-02 NOTE — Patient Instructions (Addendum)
Hold dose today. Continue to take 5 mg Tues and Thurs.  Then take 2.5 mg every other day. Check in 4 week.     Latest dosing instructions   Total Sun Mon Tue Wed Thu Fri Sat   22.5 2.5 mg 2.5 mg 5 mg 2.5 mg 5 mg 2.5 mg 2.5 mg    (5 mg0.5) (5 mg0.5) (5 mg1) (5 mg0.5) (5 mg1) (5 mg0.5) (5 mg0.5)

## 2011-11-05 ENCOUNTER — Other Ambulatory Visit (HOSPITAL_COMMUNITY): Payer: Medicare Other

## 2011-11-08 ENCOUNTER — Encounter (HOSPITAL_COMMUNITY): Payer: Medicare Other

## 2011-11-22 ENCOUNTER — Ambulatory Visit (HOSPITAL_COMMUNITY): Payer: Medicare Other | Attending: Cardiovascular Disease | Admitting: Radiology

## 2011-11-22 VITALS — BP 128/93 | Ht 68.0 in | Wt 212.0 lb

## 2011-11-22 DIAGNOSIS — I10 Essential (primary) hypertension: Secondary | ICD-10-CM | POA: Diagnosis not present

## 2011-11-22 DIAGNOSIS — R5381 Other malaise: Secondary | ICD-10-CM | POA: Diagnosis not present

## 2011-11-22 DIAGNOSIS — R0609 Other forms of dyspnea: Secondary | ICD-10-CM | POA: Insufficient documentation

## 2011-11-22 DIAGNOSIS — R0602 Shortness of breath: Secondary | ICD-10-CM

## 2011-11-22 DIAGNOSIS — R9430 Abnormal result of cardiovascular function study, unspecified: Secondary | ICD-10-CM

## 2011-11-22 DIAGNOSIS — R0989 Other specified symptoms and signs involving the circulatory and respiratory systems: Secondary | ICD-10-CM | POA: Insufficient documentation

## 2011-11-22 DIAGNOSIS — I4891 Unspecified atrial fibrillation: Secondary | ICD-10-CM | POA: Insufficient documentation

## 2011-11-22 MED ORDER — REGADENOSON 0.4 MG/5ML IV SOLN
0.4000 mg | Freq: Once | INTRAVENOUS | Status: AC
  Administered 2011-11-22: 0.4 mg via INTRAVENOUS

## 2011-11-22 MED ORDER — TECHNETIUM TC 99M SESTAMIBI GENERIC - CARDIOLITE
33.0000 | Freq: Once | INTRAVENOUS | Status: AC | PRN
  Administered 2011-11-22: 33 via INTRAVENOUS

## 2011-11-22 MED ORDER — TECHNETIUM TC 99M SESTAMIBI GENERIC - CARDIOLITE
11.0000 | Freq: Once | INTRAVENOUS | Status: AC | PRN
  Administered 2011-11-22: 11 via INTRAVENOUS

## 2011-11-22 NOTE — Progress Notes (Signed)
[facility] SITE 3 NUCLEAR MED [id number] [place] [address] [phone]  Cardiology Nuclear Med Study  Jose Bonilla is a 66 y.o. male     MRN : [id number]     DOB: 1946/01/08  Procedure Date: 11/22/2011  Nuclear Med Background Indication for Stress Test:  Evaluation for Ischemia History:  6/13 EF 40-45% Cardiac Risk Factors: CVA and Hypertension  Symptoms:  DOE and Fatigue   Nuclear Pre-Procedure Caffeine/Decaff Intake:  None NPO After: 7:30am   Lungs:  clear O2 Sat: 99% on room air. IV 0.9% NS with Angio Cath:  20g  IV Site: R Antecubital  IV Started by:  Stanton Kidney, EMT-P  Chest Size (in):  44 Cup Size: n/a  Height: 5\' 8"  (1.727 m)  Weight:  212 lb (96.163 kg)  BMI:  Body mass index is 32.23 kg/(m^2). Tech Comments:  Atenolol held > 24 hours, per patient.    Nuclear Med Study 1 or 2 day study: 1 day  Stress Test Type:  Lexiscan  Reading MD: Charlton Haws, MD  Order Authorizing Provider:  Willa Rough, MD  Resting Radionuclide: Technetium 31m Sestamibi  Resting Radionuclide Dose: 10.9 mCi   Stress Radionuclide:  Technetium 12m Sestamibi  Stress Radionuclide Dose: 33.0 mCi           Stress Protocol Rest HR: 81 Stress HR:113  Rest BP: 128/93 Stress BP: 144/98  Exercise Time (min): n/a METS: n/a   Predicted Max HR: 153 bpm % Max HR: 73.86 bpm Rate Pressure Product: 62952   Dose of Adenosine (mg):  n/a Dose of Lexiscan: 0.4 mg  Dose of Atropine (mg): n/a Dose of Dobutamine: n/a mcg/kg/min (at max HR)  Stress Test Technologist: Bonnita Levan, RN  Nuclear Technologist:  Domenic Polite, CNMT     Rest Procedure:  Myocardial perfusion imaging was performed at rest 45 minutes following the intravenous administration of Technetium 24m Sestamibi. Rest ECG: Atrial Fibrilliation  Stress Procedure:  The patient received IV Lexiscan 0.4 mg over 15-seconds.  Technetium 31m Sestamibi injected at 30-seconds.  There were no significant  changes with Lexiscan.  Quantitative spect images were obtained after a 45 minute delay. Stress ECG: No significant change from baseline ECG  QPS Raw Data Images:  Patient motion noted. Stress Images:  Normal homogeneous uptake in all areas of the myocardium. Rest Images:  Normal homogeneous uptake in all areas of the myocardium. Subtraction (SDS):  Normal Transient Ischemic Dilatation (Normal <1.22):  1.12 Lung/Heart Ratio (Normal <0.45):  0.43  Quantitative Gated Spect Images QGS EDV:  n/a QGS ESV:  n/a  Impression Exercise Capacity:  Lexiscan with no exercise. BP Response:  Normal blood pressure response. Clinical Symptoms:  No significant symptoms noted. ECG Impression:  No significant ST segment change suggestive of ischemia. Comparison with Prior Nuclear Study: No images to compare  Overall Impression:  Normal stress nuclear study. Baseline rhythm afib  Patient scanned with arms down  LV Ejection Fraction: Study not gated.  LV Wall Motion:  Study not gated   [employer]

## 2011-11-30 ENCOUNTER — Ambulatory Visit (INDEPENDENT_AMBULATORY_CARE_PROVIDER_SITE_OTHER): Payer: Medicare Other | Admitting: Family

## 2011-11-30 DIAGNOSIS — I4891 Unspecified atrial fibrillation: Secondary | ICD-10-CM | POA: Diagnosis not present

## 2011-11-30 NOTE — Patient Instructions (Signed)
Continue to take 5 mg Tues and Thurs.  Then take 2.5 mg every other day. Check in 4 week.     Latest dosing instructions   Total Sun Mon Tue Wed Thu Fri Sat   22.5 2.5 mg 2.5 mg 5 mg 2.5 mg 5 mg 2.5 mg 2.5 mg    (5 mg0.5) (5 mg0.5) (5 mg1) (5 mg0.5) (5 mg1) (5 mg0.5) (5 mg0.5)        

## 2011-12-10 ENCOUNTER — Encounter: Payer: Self-pay | Admitting: Cardiology

## 2011-12-12 ENCOUNTER — Ambulatory Visit (INDEPENDENT_AMBULATORY_CARE_PROVIDER_SITE_OTHER): Payer: Medicare Other | Admitting: Cardiology

## 2011-12-12 ENCOUNTER — Encounter: Payer: Self-pay | Admitting: Cardiology

## 2011-12-12 VITALS — BP 146/95 | HR 64 | Ht 69.6 in | Wt 216.0 lb

## 2011-12-12 DIAGNOSIS — R609 Edema, unspecified: Secondary | ICD-10-CM | POA: Diagnosis not present

## 2011-12-12 DIAGNOSIS — R0989 Other specified symptoms and signs involving the circulatory and respiratory systems: Secondary | ICD-10-CM

## 2011-12-12 DIAGNOSIS — R9430 Abnormal result of cardiovascular function study, unspecified: Secondary | ICD-10-CM

## 2011-12-12 DIAGNOSIS — I4891 Unspecified atrial fibrillation: Secondary | ICD-10-CM

## 2011-12-12 DIAGNOSIS — I10 Essential (primary) hypertension: Secondary | ICD-10-CM | POA: Diagnosis not present

## 2011-12-12 DIAGNOSIS — Z7901 Long term (current) use of anticoagulants: Secondary | ICD-10-CM

## 2011-12-12 NOTE — Assessment & Plan Note (Signed)
The patient's edema improved and he has no volume overload without a diuretic.

## 2011-12-12 NOTE — Assessment & Plan Note (Signed)
Blood pressure is mildly elevated today. He thinks that it is situational. I suspect he may need more medications. We will decide this after we obtain more data.

## 2011-12-12 NOTE — Assessment & Plan Note (Addendum)
His nuclear scan revealed no scar or ischemia. This argues against coronary disease. It is time to reassess LV function now that he has had better rate control. Two-dimensional echo will be done.

## 2011-12-12 NOTE — Assessment & Plan Note (Signed)
Atrial fibrillation rate is mildly elevated today. He insists that this is related to the stress, to the office in the bad traffic. We need more data to better quantify his atrial fib rate. I will place a 48 hour Holter.

## 2011-12-12 NOTE — Progress Notes (Signed)
HPI   Patient is seen today to followup atrial fibrillation. I saw him last in June, 2013. I saw him initially in consultation in May, 2013 area atrial fibrillation was noted at that time. Diltiazem was added for rate control.  Two-dimensional echo was done. The study was technically difficult but the ejection fraction was thought to be in the 40-45% range. I was concerned that he might have a rate related cardiomyopathy. I was also concerned that he might have coronary disease. Ultimately I recommended a nuclear scan. This was done in September, 2013. It could not be gated because of the atrial fib. However there was no ischemia. Overall the patient is feeling well. He mentions that he goes about his usual activities. He does have some shortness of breath when walking up hill.  He states that his drive to the office today was very stressful. He thinks this may have affected his blood pressure and his heart rate.  As part of today's evaluation I had an extensive timely discussion with the patient. I explained to him how the nuclear scan worked.   No Known Allergies  Current Outpatient Prescriptions  Medication Sig Dispense Refill  . atenolol (TENORMIN) 100 MG tablet Take 1 tablet (100 mg total) by mouth daily.  90 tablet  3  . diltiazem (CARDIZEM CD) 180 MG 24 hr capsule Take 1 capsule (180 mg total) by mouth daily.  30 capsule  6  . lisinopril-hydrochlorothiazide (PRINZIDE,ZESTORETIC) 10-12.5 MG per tablet Take 1 tablet by mouth daily.      Marland Kitchen warfarin (COUMADIN) 2.5 MG tablet Take 1 tablet (2.5 mg total) by mouth daily.  30 tablet  3  . warfarin (COUMADIN) 5 MG tablet TAKE ONE TABLET BY MOUTH EVERY DAY  30 tablet  2    History   Social History  . Marital Status: Married    Spouse Name: N/A    Number of Children: N/A  . Years of Education: N/A   Occupational History  . Not on file.   Social History Main Topics  . Smoking status: Never Smoker   . Smokeless tobacco: Not on file  .  Alcohol Use: Not on file  . Drug Use: Not on file  . Sexually Active: Not on file   Other Topics Concern  . Not on file   Social History Narrative  . No narrative on file    Family History  Problem Relation Age of Onset  . Heart disease Mother   . Hypertension Father   . Diabetes Paternal Aunt     Past Medical History  Diagnosis Date  . Allergy   . Hypertension   . Warfarin anticoagulation     Atrial fibrillation  . Atrial fibrillation     Coumadin therapy  . CVA (cerebral infarction)     Question  CT scan elsewhere  with possible old CVA ,  CT scan done  for acute confusion and question of TIA in the past  . Gout     Acute episode May, 2013  . Overweight   . Edema     May, 2013  . Ejection fraction < 50%     EF 40-45%, echo, June, 2013, mild RV dysfunction    No past surgical history on file.  Patient Active Problem List  Diagnosis  . Hypertension  . Warfarin anticoagulation  . CVA (cerebral infarction)  . Gout  . Overweight  . Edema  . Ejection fraction < 50%  . Atrial fibrillation  ROS   Patient denies fever, chills, headache, sweats, rash, change in vision, change in hearing, chest pain, cough, nausea vomiting, urinary symptoms. All other systems are reviewed and are negative.  PHYSICAL EXAM  Patient is oriented to person time and place. Affect is normal. He is overweight. There is no jugulovenous distention. Lungs are clear. Respiratory effort is nonlabored. Cardiac exam reveals S1 and S2. The rhythm is irregularly irregular compatible with atrial fibrillation. The abdomen is soft. He has no significant peripheral edema. There are no musculoskeletal deformities. There are no skin rashes.  Filed Vitals:   12/12/11 1604  BP: 146/95  Pulse: 64  Height: 5' 9.6" (1.768 m)  Weight: 216 lb (97.977 kg)   EKG is done today and reviewed by me. The rate is 105. There is no QRS change. It is atrial fibrillation.  ASSESSMENT & PLAN

## 2011-12-12 NOTE — Assessment & Plan Note (Signed)
He is continuing anticoagulation with Coumadin for his atrial fibrillation.

## 2011-12-12 NOTE — Patient Instructions (Addendum)
Your physician recommends that you schedule a follow-up appointment in: 6 weeks  Your physician has recommended that you wear a holter monitor. Holter monitors are medical devices that record the heart's electrical activity. Doctors most often use these monitors to diagnose arrhythmias. Arrhythmias are problems with the speed or rhythm of the heartbeat. The monitor is a small, portable device. You can wear one while you do your normal daily activities. This is usually used to diagnose what is causing palpitations/syncope (passing out).  Your physician has requested that you have an echocardiogram. Echocardiography is a painless test that uses sound waves to create images of your heart. It provides your doctor with information about the size and shape of your heart and how well your heart's chambers and valves are working. This procedure takes approximately one hour. There are no restrictions for this procedure.

## 2011-12-17 ENCOUNTER — Telehealth: Payer: Self-pay | Admitting: Cardiology

## 2011-12-17 NOTE — Telephone Encounter (Signed)
Spoke with Jose Bonilla to schedule Echo and 48 hr monitor.   He is unable to schedule at this time, he is out of town working.   Ok per Mr. Stocking, we well defer tests x 1 month.  He is going to call when he is available.

## 2011-12-28 ENCOUNTER — Ambulatory Visit (INDEPENDENT_AMBULATORY_CARE_PROVIDER_SITE_OTHER): Payer: Medicare Other | Admitting: Family

## 2011-12-28 DIAGNOSIS — I4891 Unspecified atrial fibrillation: Secondary | ICD-10-CM | POA: Diagnosis not present

## 2011-12-28 DIAGNOSIS — Z7901 Long term (current) use of anticoagulants: Secondary | ICD-10-CM

## 2011-12-28 LAB — POCT INR: INR: 2.6

## 2011-12-28 NOTE — Patient Instructions (Addendum)
Continue to take 5 mg Tues and Thurs.  Then take 2.5 mg every other day. Check in 4 week.     Latest dosing instructions   Total Sun Mon Tue Wed Thu Fri Sat   22.5 2.5 mg 2.5 mg 5 mg 2.5 mg 5 mg 2.5 mg 2.5 mg    (5 mg0.5) (5 mg0.5) (5 mg1) (5 mg0.5) (5 mg1) (5 mg0.5) (5 mg0.5)

## 2012-01-10 ENCOUNTER — Other Ambulatory Visit: Payer: Self-pay | Admitting: Family

## 2012-01-14 ENCOUNTER — Other Ambulatory Visit: Payer: Self-pay

## 2012-01-14 DIAGNOSIS — I4891 Unspecified atrial fibrillation: Secondary | ICD-10-CM

## 2012-01-14 MED ORDER — DILTIAZEM HCL ER COATED BEADS 180 MG PO CP24: 180.0000 mg | ORAL_CAPSULE | Freq: Every day | ORAL | Status: DC

## 2012-01-25 ENCOUNTER — Ambulatory Visit (INDEPENDENT_AMBULATORY_CARE_PROVIDER_SITE_OTHER): Payer: Medicare Other | Admitting: Family

## 2012-01-25 DIAGNOSIS — I4891 Unspecified atrial fibrillation: Secondary | ICD-10-CM | POA: Diagnosis not present

## 2012-01-25 DIAGNOSIS — Z7901 Long term (current) use of anticoagulants: Secondary | ICD-10-CM

## 2012-01-25 LAB — POCT INR: INR: 2.7

## 2012-01-25 NOTE — Patient Instructions (Addendum)
Continue to take 5 mg Tues and Thurs.  Then take 2.5 mg every other day. Check in 6 week.     Latest dosing instructions   Total Sun Mon Tue Wed Thu Fri Sat   22.5 2.5 mg 2.5 mg 5 mg 2.5 mg 5 mg 2.5 mg 2.5 mg    (5 mg0.5) (5 mg0.5) (5 mg1) (5 mg0.5) (5 mg1) (5 mg0.5) (5 mg0.5)

## 2012-03-05 ENCOUNTER — Ambulatory Visit (INDEPENDENT_AMBULATORY_CARE_PROVIDER_SITE_OTHER): Payer: Medicare Other | Admitting: Family Medicine

## 2012-03-05 ENCOUNTER — Ambulatory Visit (INDEPENDENT_AMBULATORY_CARE_PROVIDER_SITE_OTHER): Payer: Medicare Other | Admitting: Family

## 2012-03-05 ENCOUNTER — Encounter: Payer: Self-pay | Admitting: Family Medicine

## 2012-03-05 VITALS — BP 130/88 | Temp 97.5°F | Wt 218.0 lb

## 2012-03-05 DIAGNOSIS — I4891 Unspecified atrial fibrillation: Secondary | ICD-10-CM

## 2012-03-05 DIAGNOSIS — Z7901 Long term (current) use of anticoagulants: Secondary | ICD-10-CM

## 2012-03-05 DIAGNOSIS — I10 Essential (primary) hypertension: Secondary | ICD-10-CM

## 2012-03-05 NOTE — Progress Notes (Signed)
  Subjective:    Patient ID: Jose Bonilla, male    DOB: [birth year], 67 y.o.   MRN: [id number]  HPI Medical followup. Patient has history of hypertension, atrial fibrillation, gout, chronic Coumadin therapy. He is doing well and feels much better compared to last year. Chronic atrial fibrillation rate controlled. INR 2.1. No bleeding complications. No significant dyspnea. No chest pains. Nuclear stress test last year no ischemia.  Compliant with all medications. Previously had palpitations when taking atenolol once daily. We suggested taking 100 mg one half tablet twice daily (given short half life of atenolol) and symptoms have improved. No dizziness or syncope.  Past Medical History  Diagnosis Date  . Allergy   . Hypertension   . Warfarin anticoagulation     Atrial fibrillation  . Atrial fibrillation     Coumadin therapy  . CVA (cerebral infarction)     Question  CT scan elsewhere  with possible old CVA ,  CT scan done  for acute confusion and question of TIA in the past  . Gout     Acute episode May, 2013  . Overweight   . Edema     May, 2013  . Ejection fraction < 50%     EF 40-45%, echo, June, 2013, mild RV dysfunction   No past surgical history on file.  reports that he has never smoked. He does not have any smokeless tobacco history on file. His alcohol and drug histories not on file. family history includes Diabetes in his paternal aunt; Heart disease in his mother; and Hypertension in his father. No Known Allergies    Review of Systems  Constitutional: Negative for fatigue.  Eyes: Negative for visual disturbance.  Respiratory: Negative for cough, chest tightness and shortness of breath.   Cardiovascular: Negative for chest pain, palpitations and leg swelling.  Neurological: Negative for dizziness, syncope, weakness, light-headedness and headaches.       Objective:   Physical Exam  Constitutional: He appears well-developed and well-nourished.  Neck: Neck supple. No  thyromegaly present.  Cardiovascular:       Irregular irregular rhythm rate controlled  Pulmonary/Chest: Effort normal and breath sounds normal. No respiratory distress. He has no wheezes. He has no rales.  Musculoskeletal: He exhibits no edema.          Assessment & Plan:  #1 atrial fibrillation. Rate controlled. Continue current medications. Continue regular Coumadin checks #2 hypertension. Stable. Continue current medications. Check basic metabolic panel at followup

## 2012-03-05 NOTE — Patient Instructions (Signed)
Continue to take 5 mg Tues and Thurs.  Then take 2.5 mg every other day. Check in 6 week.     Latest dosing instructions   Total Sun Mon Tue Wed Thu Fri Sat   22.5 2.5 mg 2.5 mg 5 mg 2.5 mg 5 mg 2.5 mg 2.5 mg    (5 mg0.5) (5 mg0.5) (5 mg1) (5 mg0.5) (5 mg1) (5 mg0.5) (5 mg0.5)        

## 2012-03-07 ENCOUNTER — Encounter: Payer: Medicare Other | Admitting: Family

## 2012-03-25 ENCOUNTER — Other Ambulatory Visit: Payer: Self-pay | Admitting: Family Medicine

## 2012-03-25 ENCOUNTER — Other Ambulatory Visit: Payer: Self-pay | Admitting: *Deleted

## 2012-03-25 MED ORDER — ATENOLOL 100 MG PO TABS: 50.0000 mg | ORAL_TABLET | Freq: Two times a day (BID) | ORAL | Status: DC

## 2012-04-10 ENCOUNTER — Other Ambulatory Visit: Payer: Self-pay

## 2012-04-10 MED ORDER — WARFARIN SODIUM 2.5 MG PO TABS: 2.5000 mg | ORAL_TABLET | Freq: Every day | ORAL | Status: DC

## 2012-04-16 ENCOUNTER — Ambulatory Visit (INDEPENDENT_AMBULATORY_CARE_PROVIDER_SITE_OTHER): Payer: Medicare Other | Admitting: Family

## 2012-04-16 DIAGNOSIS — I4891 Unspecified atrial fibrillation: Secondary | ICD-10-CM

## 2012-04-16 NOTE — Patient Instructions (Addendum)
Continue to take 5 mg Tues and Thurs.  Then take 2.5 mg every other day. Check in 6 week.   Anticoagulation Dose Instructions as of 04/16/2012     Glynis Smiles Tue Wed Thu Fri Sat   New Dose 2.5 mg 2.5 mg 5 mg 2.5 mg 5 mg 2.5 mg 2.5 mg    Description       Continue to take 5 mg Tues and Thurs.  Then take 2.5 mg every other day. Check in 6 week.

## 2012-05-27 ENCOUNTER — Ambulatory Visit (INDEPENDENT_AMBULATORY_CARE_PROVIDER_SITE_OTHER): Payer: Medicare Other | Admitting: Family

## 2012-05-27 DIAGNOSIS — I4891 Unspecified atrial fibrillation: Secondary | ICD-10-CM

## 2012-05-27 LAB — POCT INR: INR: 2.4

## 2012-05-27 NOTE — Patient Instructions (Addendum)
Continue to take 5 mg Tues and Thurs.  Then take 2.5 mg every other day. Check in 6 week.   Anticoagulation Dose Instructions as of 05/27/2012     Glynis Smiles Tue Wed Thu Fri Sat   New Dose 2.5 mg 2.5 mg 5 mg 2.5 mg 5 mg 2.5 mg 2.5 mg    Description       Continue to take 5 mg Tues and Thurs.  Then take 2.5 mg every other day. Check in 6 week.

## 2012-05-28 ENCOUNTER — Encounter: Payer: Medicare Other | Admitting: Family

## 2012-06-20 ENCOUNTER — Other Ambulatory Visit: Payer: Self-pay | Admitting: Family Medicine

## 2012-06-20 ENCOUNTER — Other Ambulatory Visit: Payer: Self-pay | Admitting: Family

## 2012-06-25 ENCOUNTER — Other Ambulatory Visit: Payer: Self-pay | Admitting: *Deleted

## 2012-06-25 DIAGNOSIS — I4891 Unspecified atrial fibrillation: Secondary | ICD-10-CM

## 2012-06-25 MED ORDER — DILTIAZEM HCL ER COATED BEADS 180 MG PO CP24: 180.0000 mg | ORAL_CAPSULE | Freq: Every day | ORAL | Status: DC

## 2012-07-07 ENCOUNTER — Ambulatory Visit (INDEPENDENT_AMBULATORY_CARE_PROVIDER_SITE_OTHER): Payer: Medicare Other | Admitting: Family

## 2012-07-07 DIAGNOSIS — I4891 Unspecified atrial fibrillation: Secondary | ICD-10-CM | POA: Diagnosis not present

## 2012-07-07 NOTE — Patient Instructions (Addendum)
Take and extra 1/2 tab today and tomorrow. Continue to take 5 mg Tues and Thurs.  Then take 2.5 mg every other day. Check in 2 week.   Anticoagulation Dose Instructions as of 07/07/2012     Glynis Smiles Tue Wed Thu Fri Sat   New Dose 2.5 mg 2.5 mg 5 mg 2.5 mg 5 mg 2.5 mg 2.5 mg    Description       Take and extra 1/2 tab today and tomorrow. Continue to take 5 mg Tues and Thurs.  Then take 2.5 mg every other day. Check in 2 week.

## 2012-07-21 ENCOUNTER — Encounter: Payer: Medicare Other | Admitting: Family

## 2012-07-22 ENCOUNTER — Ambulatory Visit (INDEPENDENT_AMBULATORY_CARE_PROVIDER_SITE_OTHER): Payer: Medicare Other | Admitting: Family

## 2012-07-22 DIAGNOSIS — Z7901 Long term (current) use of anticoagulants: Secondary | ICD-10-CM | POA: Diagnosis not present

## 2012-07-22 DIAGNOSIS — I4891 Unspecified atrial fibrillation: Secondary | ICD-10-CM | POA: Diagnosis not present

## 2012-07-22 LAB — POCT INR: INR: 3

## 2012-07-22 NOTE — Patient Instructions (Signed)
Take 5mg  on Tuesday and Thursday and 2.5mg  all other days. Recheck in 3 weeks. Eat an extra serving of greens.  Anticoagulation Dose Instructions as of 07/22/2012     Glynis Smiles Tue Wed Thu Fri Sat   New Dose 2.5 mg 2.5 mg 5 mg 2.5 mg 5 mg 2.5 mg 2.5 mg    Description       Take 5mg  on Tuesday and Thursday and 2.5mg  all other days. Recheck in 3 weeks. Eat an extra serving of greens.

## 2012-08-12 ENCOUNTER — Encounter: Payer: Medicare Other | Admitting: Family

## 2012-08-18 ENCOUNTER — Encounter: Payer: Self-pay | Admitting: Family Medicine

## 2012-08-18 ENCOUNTER — Ambulatory Visit (INDEPENDENT_AMBULATORY_CARE_PROVIDER_SITE_OTHER): Payer: Medicare Other | Admitting: General Practice

## 2012-08-18 ENCOUNTER — Ambulatory Visit (INDEPENDENT_AMBULATORY_CARE_PROVIDER_SITE_OTHER): Payer: Medicare Other | Admitting: Family Medicine

## 2012-08-18 VITALS — BP 126/70 | HR 90 | Temp 97.5°F | Wt 220.0 lb

## 2012-08-18 DIAGNOSIS — Z7901 Long term (current) use of anticoagulants: Secondary | ICD-10-CM | POA: Diagnosis not present

## 2012-08-18 DIAGNOSIS — I10 Essential (primary) hypertension: Secondary | ICD-10-CM | POA: Diagnosis not present

## 2012-08-18 DIAGNOSIS — I4891 Unspecified atrial fibrillation: Secondary | ICD-10-CM

## 2012-08-18 LAB — POCT INR: INR: 1.7

## 2012-08-18 NOTE — Progress Notes (Signed)
  Subjective:    Patient ID: Jose Bonilla, male    DOB: Aug 21, 1945, 67 y.o.   MRN: [id number]  HPI  Medical followup. Patient has history of atrial fibrillation, hypertension, gout, and remote history of CVA He remains on Coumadin. No recent bleeding complications. No chest pains. No palpitations. Nuclear stress test last year was unremarkable.    Generally feels well. Compliant with medications.  Last colonoscopy was about 6 months ago and is considering repeat  Past Medical History  Diagnosis Date  . Allergy   . Hypertension   . Warfarin anticoagulation     Atrial fibrillation  . Atrial fibrillation     Coumadin therapy  . CVA (cerebral infarction)     Question  CT scan elsewhere  with possible old CVA ,  CT scan done  for acute confusion and question of TIA in the past  . Gout     Acute episode May, 2013  . Overweight(278.02)   . Edema     May, 2013  . Ejection fraction < 50%     EF 40-45%, echo, June, 2013, mild RV dysfunction   No past surgical history on file.  reports that he has never smoked. He does not have any smokeless tobacco history on file. His alcohol and drug histories are not on file. family history includes Diabetes in his paternal aunt; Heart disease in his mother; and Hypertension in his father. No Known Allergies   Review of Systems  Constitutional: Negative for fatigue.  Eyes: Negative for visual disturbance.  Respiratory: Negative for cough, chest tightness and shortness of breath.   Cardiovascular: Negative for chest pain, palpitations and leg swelling.  Neurological: Negative for dizziness, syncope, weakness, light-headedness and headaches.       Objective:   Physical Exam  Constitutional: He appears well-developed and well-nourished.  Cardiovascular: Normal rate.   Irregularly irregular rhythm rate controlled  Pulmonary/Chest: Effort normal and breath sounds normal. No respiratory distress. He has no wheezes. He has no rales.   Musculoskeletal: He exhibits no edema.          Assessment & Plan:  Atrial fibrillation. Rate controlled. Continue Coumadin. INR today. Continue atenolol and diltiazem. Hypertension which has been well controlled. Check basic metabolic panel on HCTZ

## 2012-08-18 NOTE — Patient Instructions (Addendum)
Atrial Fibrillation Your caregiver has diagnosed you with atrial fibrillation (AFib). The heart normally beats very regularly; AFib is a type of irregular heartbeat. The heart rate may be faster or slower than normal. This can prevent your heart from pumping as well as it should. AFib can be constant (chronic) or intermittent (paroxysmal). CAUSES  Atrial fibrillation may be caused by:  Heart disease, including heart attack, coronary artery disease, heart failure, diseases of the heart valves, and others.  Blood clot in the lungs (pulmonary embolism).  Pneumonia or other infections.  Chronic lung disease.  Thyroid disease.  Toxins. These include alcohol, some medications (such as decongestant medications or diet pills), and caffeine. In some people, no cause for AFib can be found. This is referred to as Lone Atrial Fibrillation. SYMPTOMS   Palpitations or a fluttering in your chest.  A vague sense of chest discomfort.  Shortness of breath.  Sudden onset of lightheadedness or weakness. Sometimes, the first sign of AFib can be a complication of the condition. This could be a stroke or heart failure. DIAGNOSIS  Your description of your condition may make your caregiver suspicious of atrial fibrillation. Your caregiver will examine your pulse to determine if fibrillation is present. An EKG (electrocardiogram) will confirm the diagnosis. Further testing may help determine what caused you to have atrial fibrillation. This may include chest x-ray, echocardiogram, blood tests, or CT scans. PREVENTION  If you have previously had atrial fibrillation, your caregiver may advise you to avoid substances known to cause the condition (such as stimulant medications, and possibly caffeine or alcohol). You may be advised to use medications to prevent recurrence. Proper treatment of any underlying condition is important to help prevent recurrence. PROGNOSIS  Atrial fibrillation does tend to become a  chronic condition over time. It can cause significant complications (see below). Atrial fibrillation is not usually immediately life-threatening, but it can shorten your life expectancy. This seems to be worse in women. If you have lone atrial fibrillation and are under 60 years old, the risk of complications is very low, and life expectancy is not shortened. RISKS AND COMPLICATIONS  Complications of atrial fibrillation can include stroke, chest pain, and heart failure. Your caregiver will recommend treatments for the atrial fibrillation, as well as for any underlying conditions, to help minimize risk of complications. TREATMENT  Treatment for AFib is divided into several categories:  Treatment of any underlying condition.  Converting you out of AFib into a regular (sinus) rhythm.  Controlling rapid heart rate.  Prevention of blood clots and stroke. Medications and procedures are available to convert your atrial fibrillation to sinus rhythm. However, recent studies have shown that this may not offer you any advantage, and cardiac experts are continuing research and debate on this topic. More important is controlling your rapid heartbeat. The rapid heartbeat causes more symptoms, and places strain on your heart. Your caregiver will advise you on the use of medications that can control your heart rate. Atrial fibrillation is a strong stroke risk. You can lessen this risk by taking blood thinning medications such as Coumadin (warfarin), or sometimes aspirin. These medications need close monitoring by your caregiver. Over-medication can cause bleeding. Too little medication may not protect against stroke. HOME CARE INSTRUCTIONS   If your caregiver prescribed medicine to make your heartbeat more normally, take as directed.  If blood thinners were prescribed by your caregiver, take EXACTLY as directed.  Perform blood tests EXACTLY as directed.  Quit smoking. Smoking increases your cardiac and   lung  (pulmonary) risks.  DO NOT drink alcohol.  DO NOT drink caffeinated drinks (e.g. coffee, soda, chocolate, and leaf teas). You may drink decaffeinated coffee, soda or tea.  If you are overweight, you should choose a reduced calorie diet to lose weight. Please see a registered dietitian if you need more information about healthy weight loss. DO NOT USE DIET PILLS as they may aggravate heart problems.  If you have other heart problems that are causing AFib, you may need to eat a low salt, fat, and cholesterol diet. Your caregiver will tell you if this is necessary.  Exercise every day to improve your physical fitness. Stay active unless advised otherwise.  If your caregiver has given you a follow-up appointment, it is very important to keep that appointment. Not keeping the appointment could result in heart failure or stroke. If there is any problem keeping the appointment, you must call back to this facility for assistance. SEEK MEDICAL CARE IF:  You notice a change in the rate, rhythm or strength of your heartbeat.  You develop an infection or any other change in your overall health status. SEEK IMMEDIATE MEDICAL CARE IF:   You develop chest pain, abdominal pain, sweating, weakness or feel sick to your stomach (nausea).  You develop shortness of breath.  You develop swollen feet and ankles.  You develop dizziness, numbness, or weakness of your face or limbs, or any change in vision or speech. MAKE SURE YOU:   Understand these instructions.  Will watch your condition.  Will get help right away if you are not doing well or get worse. Document Released: 01/22/2005 Document Revised: 04/16/2011 Document Reviewed: 08/31/2009 [employer] Patient Information 2014 [employer].  

## 2012-08-19 LAB — BASIC METABOLIC PANEL
BUN: 18 mg/dL (ref 6–23)
Calcium: 9.2 mg/dL (ref 8.4–10.5)
Chloride: 107 mEq/L (ref 96–112)
Creatinine, Ser: 1.1 mg/dL (ref 0.4–1.5)

## 2012-09-05 ENCOUNTER — Other Ambulatory Visit: Payer: Self-pay | Admitting: Family Medicine

## 2012-09-15 ENCOUNTER — Ambulatory Visit (INDEPENDENT_AMBULATORY_CARE_PROVIDER_SITE_OTHER): Payer: Medicare Other | Admitting: General Practice

## 2012-09-15 DIAGNOSIS — I4891 Unspecified atrial fibrillation: Secondary | ICD-10-CM

## 2012-09-15 LAB — POCT INR: INR: 1.2

## 2012-09-22 ENCOUNTER — Ambulatory Visit (INDEPENDENT_AMBULATORY_CARE_PROVIDER_SITE_OTHER): Payer: Medicare Other | Admitting: General Practice

## 2012-09-22 DIAGNOSIS — I4891 Unspecified atrial fibrillation: Secondary | ICD-10-CM

## 2012-09-22 LAB — POCT INR: INR: 2.1

## 2012-10-20 ENCOUNTER — Ambulatory Visit (INDEPENDENT_AMBULATORY_CARE_PROVIDER_SITE_OTHER): Payer: Medicare Other | Admitting: General Practice

## 2012-10-20 DIAGNOSIS — I4891 Unspecified atrial fibrillation: Secondary | ICD-10-CM

## 2012-10-20 LAB — POCT INR: INR: 3.2

## 2012-11-12 ENCOUNTER — Other Ambulatory Visit: Payer: Self-pay | Admitting: Family Medicine

## 2012-11-17 ENCOUNTER — Ambulatory Visit (INDEPENDENT_AMBULATORY_CARE_PROVIDER_SITE_OTHER): Payer: Medicare Other | Admitting: General Practice

## 2012-11-17 DIAGNOSIS — I4891 Unspecified atrial fibrillation: Secondary | ICD-10-CM

## 2012-12-15 ENCOUNTER — Ambulatory Visit (INDEPENDENT_AMBULATORY_CARE_PROVIDER_SITE_OTHER): Payer: Medicare Other | Admitting: Family

## 2012-12-15 ENCOUNTER — Ambulatory Visit: Payer: Medicare Other

## 2012-12-15 DIAGNOSIS — I4891 Unspecified atrial fibrillation: Secondary | ICD-10-CM

## 2013-02-26 ENCOUNTER — Other Ambulatory Visit: Payer: Self-pay | Admitting: Cardiology

## 2013-03-03 ENCOUNTER — Other Ambulatory Visit: Payer: Self-pay | Admitting: Cardiology

## 2013-03-19 ENCOUNTER — Ambulatory Visit (INDEPENDENT_AMBULATORY_CARE_PROVIDER_SITE_OTHER): Payer: Medicare Other | Admitting: General Practice

## 2013-03-19 DIAGNOSIS — Z5181 Encounter for therapeutic drug level monitoring: Secondary | ICD-10-CM | POA: Diagnosis not present

## 2013-03-19 DIAGNOSIS — I4891 Unspecified atrial fibrillation: Secondary | ICD-10-CM

## 2013-03-19 LAB — POCT INR: INR: 2.3

## 2013-03-19 NOTE — Progress Notes (Signed)
Pre-visit discussion using our clinic review tool. No additional management support is needed unless otherwise documented below in the visit note.  

## 2013-04-08 ENCOUNTER — Other Ambulatory Visit: Payer: Self-pay | Admitting: Cardiology

## 2013-04-15 ENCOUNTER — Other Ambulatory Visit: Payer: Self-pay

## 2013-04-15 MED ORDER — ATENOLOL 100 MG PO TABS: 50.0000 mg | ORAL_TABLET | Freq: Two times a day (BID) | ORAL | Status: DC

## 2013-04-22 ENCOUNTER — Ambulatory Visit: Payer: Medicare Other | Admitting: Family Medicine

## 2013-04-30 ENCOUNTER — Ambulatory Visit: Payer: Medicare Other

## 2013-04-30 ENCOUNTER — Ambulatory Visit: Payer: Medicare Other | Admitting: Family Medicine

## 2013-05-01 ENCOUNTER — Other Ambulatory Visit: Payer: Self-pay | Admitting: *Deleted

## 2013-05-01 ENCOUNTER — Telehealth: Payer: Self-pay | Admitting: Cardiology

## 2013-05-01 MED ORDER — DILTIAZEM HCL ER COATED BEADS 180 MG PO CP24: ORAL_CAPSULE | ORAL | Status: DC

## 2013-05-20 ENCOUNTER — Other Ambulatory Visit: Payer: Self-pay | Admitting: Family

## 2013-05-20 ENCOUNTER — Other Ambulatory Visit: Payer: Self-pay | Admitting: Cardiology

## 2013-05-20 ENCOUNTER — Other Ambulatory Visit: Payer: Self-pay | Admitting: General Practice

## 2013-05-20 MED ORDER — WARFARIN SODIUM 2.5 MG PO TABS: ORAL_TABLET | ORAL | Status: DC

## 2013-05-21 ENCOUNTER — Ambulatory Visit: Payer: Medicare Other | Admitting: Family Medicine

## 2013-05-21 ENCOUNTER — Ambulatory Visit: Payer: Medicare Other

## 2013-05-25 ENCOUNTER — Ambulatory Visit: Payer: Medicare Other | Admitting: Cardiology

## 2013-06-08 ENCOUNTER — Ambulatory Visit (INDEPENDENT_AMBULATORY_CARE_PROVIDER_SITE_OTHER): Payer: Medicare Other | Admitting: Family Medicine

## 2013-06-08 ENCOUNTER — Encounter: Payer: Self-pay | Admitting: Family Medicine

## 2013-06-08 ENCOUNTER — Ambulatory Visit (INDEPENDENT_AMBULATORY_CARE_PROVIDER_SITE_OTHER): Payer: Medicare Other | Admitting: General Practice

## 2013-06-08 VITALS — BP 120/88 | HR 93 | Wt 224.0 lb

## 2013-06-08 DIAGNOSIS — I4891 Unspecified atrial fibrillation: Secondary | ICD-10-CM | POA: Diagnosis not present

## 2013-06-08 DIAGNOSIS — Z5181 Encounter for therapeutic drug level monitoring: Secondary | ICD-10-CM

## 2013-06-08 DIAGNOSIS — I10 Essential (primary) hypertension: Secondary | ICD-10-CM | POA: Diagnosis not present

## 2013-06-08 LAB — POCT INR: INR: 1.6

## 2013-06-08 MED ORDER — ATENOLOL 100 MG PO TABS: 50.0000 mg | ORAL_TABLET | Freq: Two times a day (BID) | ORAL | Status: DC

## 2013-06-08 MED ORDER — LISINOPRIL-HYDROCHLOROTHIAZIDE 10-12.5 MG PO TABS
1.0000 | ORAL_TABLET | Freq: Every day | ORAL | Status: DC
Start: 2013-06-08 — End: 2014-08-16

## 2013-06-08 MED ORDER — DILTIAZEM HCL ER COATED BEADS 180 MG PO CP24: 180.0000 mg | ORAL_CAPSULE | Freq: Every day | ORAL | Status: DC

## 2013-06-08 NOTE — Progress Notes (Signed)
Pre visit review using our clinic review tool, if applicable. No additional management support is needed unless otherwise documented below in the visit note. 

## 2013-06-08 NOTE — Patient Instructions (Signed)
Consider complete physical at some point later this year. Consider prevnar 13 (pneumonia vaccine) at some point this year.

## 2013-06-08 NOTE — Progress Notes (Signed)
   Subjective:    Patient ID: Jose Bonilla, male    DOB: November 05, 1945, 68 y.o.   MRN: [id number]  HPI Routine medical follow up.  History of atrial fibrillation, hypertension, gout. He remains on 3 drug regimen for hypertension. Also remains on Coumadin followed by Coumadin clinic. No recent bleeding complications. Inconsistent use of Coumadin recently with INR 1.6 today. Overall, he feels well. Working full time. No chest pains. No dizziness. No bleeding complications.  He has not had regular well checkups. No history of pneumonia vaccine. He is undecided at this time. He is compliant with blood pressure medications.  Past Medical History  Diagnosis Date  . Allergy   . Hypertension   . Warfarin anticoagulation     Atrial fibrillation  . Atrial fibrillation     Coumadin therapy  . CVA (cerebral infarction)     Question  CT scan elsewhere  with possible old CVA ,  CT scan done  for acute confusion and question of TIA in the past  . Gout     Acute episode May, 2013  . Overweight   . Edema     May, 2013  . Ejection fraction < 50%     EF 40-45%, echo, June, 2013, mild RV dysfunction   No past surgical history on file.  reports that he has never smoked. He does not have any smokeless tobacco history on file. His alcohol and drug histories are not on file. family history includes Diabetes in his paternal aunt; Heart disease in his mother; Hypertension in his father. No Known Allergies    Review of Systems  Constitutional: Negative for fatigue.  Eyes: Negative for visual disturbance.  Respiratory: Negative for cough, chest tightness and shortness of breath.   Cardiovascular: Negative for chest pain, palpitations and leg swelling.  Endocrine: Negative for polydipsia and polyuria.  Neurological: Negative for dizziness, syncope, weakness, light-headedness and headaches.       Objective:   Physical Exam  Constitutional: He is oriented to person, place, and time. He appears  well-developed and well-nourished.  HENT:  Right Ear: External ear normal.  Left Ear: External ear normal.  Mouth/Throat: Oropharynx is clear and moist.  Eyes: Pupils are equal, round, and reactive to light.  Neck: Neck supple. No thyromegaly present.  Cardiovascular: Normal rate.  Exam reveals no gallop.   Irregular heart rhythm consistent with atrial fibrillation  Pulmonary/Chest: Effort normal and breath sounds normal. No respiratory distress. He has no wheezes. He has no rales.  Musculoskeletal: He exhibits no edema.  Neurological: He is alert and oriented to person, place, and time.          Assessment & Plan:  #1 hypertension adequately controlled. Refill all medications for one year #2 atrial fibrillation. Rate controlled. Continue Coumadin #3 health maintenance. We've recommended Prevnar 13-but he is undecided at this time.. Recommend schedule complete physical.

## 2013-06-09 ENCOUNTER — Ambulatory Visit: Payer: Medicare Other | Admitting: Cardiology

## 2013-06-10 ENCOUNTER — Ambulatory Visit (INDEPENDENT_AMBULATORY_CARE_PROVIDER_SITE_OTHER): Payer: Medicare Other | Admitting: Cardiology

## 2013-06-10 ENCOUNTER — Encounter: Payer: Self-pay | Admitting: Cardiology

## 2013-06-10 VITALS — BP 130/88 | HR 103 | Ht 69.5 in | Wt 220.0 lb

## 2013-06-10 DIAGNOSIS — I4891 Unspecified atrial fibrillation: Secondary | ICD-10-CM | POA: Diagnosis not present

## 2013-06-10 DIAGNOSIS — I10 Essential (primary) hypertension: Secondary | ICD-10-CM | POA: Diagnosis not present

## 2013-06-10 DIAGNOSIS — Z7901 Long term (current) use of anticoagulants: Secondary | ICD-10-CM | POA: Diagnosis not present

## 2013-06-10 NOTE — Assessment & Plan Note (Signed)
The patient's atrial fib rate is increased somewhat today. I'm sure this is related to the fact that he failed to take his once a day Cardizem yesterday. I strongly encouraged him to be sure he takes his medicines every day. He should remain on anticoagulation. I would like to be sure that we are controlling his heart rate. I have recommended a Holter in the past but he did not want to do this. I am hopeful that he has had return of good LV function. I cannot document this without a follow up echo. The patient is stable. We will give him his refill prescriptions.

## 2013-06-10 NOTE — Assessment & Plan Note (Signed)
Patient is anticoagulated and followed in the primary care office.

## 2013-06-10 NOTE — Progress Notes (Signed)
Patient ID: Jose Bonilla, male   DOB: [birth year], 68 y.o.   MRN: [id number]    HPI  Patient is seen today to followup atrial fibrillation. Initially when the patient's atrial fibrillation rate was fast, his ejection fraction was in the range of 45%. When I saw him in November, 2013 I suggested a followup echo. He did not follow through with this. He's feeling well. He has not had return of volume overload. He's not having any chest pain. He does about usual activities. He failed to take his diltiazem yesterday. His heart rate today is higher than I would like to see.  No Known Allergies  Current Outpatient Prescriptions  Medication Sig Dispense Refill  . atenolol (TENORMIN) 100 MG tablet Take 0.5 tablets (50 mg total) by mouth 2 (two) times daily.  90 tablet  3  . diltiazem (CARDIZEM CD) 180 MG 24 hr capsule Take 1 capsule (180 mg total) by mouth daily.  90 capsule  3  . lisinopril-hydrochlorothiazide (PRINZIDE,ZESTORETIC) 10-12.5 MG per tablet Take 1 tablet by mouth daily.  90 tablet  3  . warfarin (COUMADIN) 2.5 MG tablet Take as directed buy anticoagulation clinic  30 tablet  0   No current facility-administered medications for this visit.    History   Social History  . Marital Status: Married    Spouse Name: N/A    Number of Children: N/A  . Years of Education: N/A   Occupational History  . Not on file.   Social History Main Topics  . Smoking status: Never Smoker   . Smokeless tobacco: Not on file  . Alcohol Use: Not on file  . Drug Use: Not on file  . Sexual Activity: Not on file   Other Topics Concern  . Not on file   Social History Narrative  . No narrative on file    Family History  Problem Relation Age of Onset  . Heart disease Mother   . Hypertension Father   . Diabetes Paternal Aunt     Past Medical History  Diagnosis Date  . Allergy   . Hypertension   . Warfarin anticoagulation     Atrial fibrillation  . Atrial fibrillation     Coumadin therapy  .  CVA (cerebral infarction)     Question  CT scan elsewhere  with possible old CVA ,  CT scan done  for acute confusion and question of TIA in the past  . Gout     Acute episode May, 2013  . Overweight   . Edema     May, 2013  . Ejection fraction < 50%     EF 40-45%, echo, June, 2013, mild RV dysfunction    History reviewed. No pertinent past surgical history.  Patient Active Problem List   Diagnosis Date Noted  . Encounter for therapeutic drug monitoring 03/19/2013  . Atrial fibrillation   . Ejection fraction < 50%   . Warfarin anticoagulation   . CVA (cerebral infarction)   . Gout   . Overweight   . Edema   . Hypertension 05/10/2011    ROS   Patient denies fever, chills, headache, sweats, rash, change in vision, change in hearing, chest pain, cough, nausea vomiting, urinary symptoms. All other systems are reviewed and are negative.  PHYSICAL EXAM  Patient is oriented to person time and place. Affect is normal. Head is atraumatic. Sclera and conjunctiva are normal. There is no jugulovenous distention. Lungs are clear. Respiratory effort is nonlabored. Cardiac exam reveals S1 and  S2. The rhythm is irregularly irregular. The abdomen is soft. There is no peripheral edema. There are no musculoskeletal deformities. There are no skin rashes.  Filed Vitals:   06/10/13 1610  BP: 130/88  Pulse: 103  Height: 5' 9.5" (1.765 m)  Weight: 220 lb (99.791 kg)   EKG is done today and reviewed by me. He has atrial fibrillation. The rate today is 103 (he failed to take his Cardizem yesterday)  ASSESSMENT & PLAN

## 2013-06-10 NOTE — Patient Instructions (Signed)
**Note De-Identified Jose Bonilla Obfuscation** Your physician recommends that you continue on your current medications as directed. Please refer to the Current Medication list given to you today. Please take your medications as directed daily.  Your physician wants you to follow-up in: 1 year. You will receive a reminder letter in the mail two months in advance. If you don't receive a letter, please call our office to schedule the follow-up appointment.

## 2013-06-10 NOTE — Assessment & Plan Note (Signed)
Blood pressures control. No change in therapy. 

## 2013-07-17 ENCOUNTER — Other Ambulatory Visit: Payer: Self-pay | Admitting: Family

## 2013-07-20 ENCOUNTER — Other Ambulatory Visit: Payer: Self-pay | Admitting: General Practice

## 2013-07-20 ENCOUNTER — Ambulatory Visit (INDEPENDENT_AMBULATORY_CARE_PROVIDER_SITE_OTHER): Payer: Medicare Other | Admitting: General Practice

## 2013-07-20 DIAGNOSIS — Z5181 Encounter for therapeutic drug level monitoring: Secondary | ICD-10-CM | POA: Diagnosis not present

## 2013-07-20 DIAGNOSIS — I4891 Unspecified atrial fibrillation: Secondary | ICD-10-CM

## 2013-07-20 LAB — POCT INR: INR: 2.9

## 2013-07-20 MED ORDER — WARFARIN SODIUM 2.5 MG PO TABS: ORAL_TABLET | ORAL | Status: DC

## 2013-07-20 NOTE — Progress Notes (Signed)
Pre visit review using our clinic review tool, if applicable. No additional management support is needed unless otherwise documented below in the visit note. 

## 2013-07-22 ENCOUNTER — Telehealth: Payer: Self-pay | Admitting: Family Medicine

## 2013-07-22 NOTE — Telephone Encounter (Signed)
Called patient again and goes straight to VM

## 2013-07-22 NOTE — Telephone Encounter (Signed)
Pt said the phone number  for his call back [phone]

## 2013-07-22 NOTE — Telephone Encounter (Signed)
Called patient, phone did not ring went straight to VM that is not set up.

## 2013-07-22 NOTE — Telephone Encounter (Signed)
Patient calling about gout left foot and ankle.  Attempted to call patient back at number he requested  [phone] and called x3 but voice mail not set up yet so could not leave message.

## 2013-08-31 ENCOUNTER — Ambulatory Visit (INDEPENDENT_AMBULATORY_CARE_PROVIDER_SITE_OTHER): Payer: Medicare Other | Admitting: General Practice

## 2013-08-31 DIAGNOSIS — Z5181 Encounter for therapeutic drug level monitoring: Secondary | ICD-10-CM

## 2013-08-31 DIAGNOSIS — I4891 Unspecified atrial fibrillation: Secondary | ICD-10-CM | POA: Diagnosis not present

## 2013-08-31 LAB — POCT INR: INR: 2.8

## 2013-08-31 NOTE — Progress Notes (Signed)
Pre visit review using our clinic review tool, if applicable. No additional management support is needed unless otherwise documented below in the visit note. 

## 2013-09-18 NOTE — Telephone Encounter (Signed)
error 

## 2013-10-15 ENCOUNTER — Ambulatory Visit: Payer: Medicare Other

## 2013-10-19 ENCOUNTER — Ambulatory Visit (INDEPENDENT_AMBULATORY_CARE_PROVIDER_SITE_OTHER): Payer: Medicare Other | Admitting: Family

## 2013-10-19 DIAGNOSIS — I4891 Unspecified atrial fibrillation: Secondary | ICD-10-CM

## 2013-10-19 DIAGNOSIS — I482 Chronic atrial fibrillation, unspecified: Secondary | ICD-10-CM

## 2013-10-19 DIAGNOSIS — Z5181 Encounter for therapeutic drug level monitoring: Secondary | ICD-10-CM

## 2013-10-19 LAB — POCT INR: INR: 1.7

## 2013-10-19 NOTE — Patient Instructions (Signed)
Continue 2.5 mg daily except 5 mg on Wednesdays.  Re-check in 4 weeks.  Anticoagulation Dose Instructions as of 10/19/2013     Dorene Grebe Tue Wed Thu Fri Sat   New Dose 2.5 mg 2.5 mg 2.5 mg 5 mg 2.5 mg 2.5 mg 2.5 mg    Description       Continue 2.5 mg daily except 5 mg on Wednesdays.  Re-check in 4 weeks.

## 2013-11-16 ENCOUNTER — Ambulatory Visit (INDEPENDENT_AMBULATORY_CARE_PROVIDER_SITE_OTHER): Payer: Medicare Other | Admitting: Family

## 2013-11-16 ENCOUNTER — Ambulatory Visit: Payer: Medicare Other

## 2013-11-16 DIAGNOSIS — Z5181 Encounter for therapeutic drug level monitoring: Secondary | ICD-10-CM

## 2013-11-16 LAB — POCT INR: INR: 2.9

## 2013-11-16 NOTE — Patient Instructions (Signed)
Continue 2.5 mg daily except 5 mg on Wednesdays.  Re-check in 4 weeks. Anticoagulation Dose Instructions as of 11/16/2013     Jose Bonilla Tue Wed Thu Fri Sat   New Dose 2.5 mg 2.5 mg 2.5 mg 5 mg 2.5 mg 2.5 mg 2.5 mg    Description       Continue 2.5 mg daily except 5 mg on Wednesdays.  Re-check in 4 weeks.

## 2013-12-14 ENCOUNTER — Ambulatory Visit (INDEPENDENT_AMBULATORY_CARE_PROVIDER_SITE_OTHER): Payer: Medicare Other | Admitting: Family

## 2013-12-14 DIAGNOSIS — Z5181 Encounter for therapeutic drug level monitoring: Secondary | ICD-10-CM | POA: Diagnosis not present

## 2013-12-14 LAB — POCT INR: INR: 2.3

## 2013-12-14 NOTE — Patient Instructions (Signed)
Continue 2.5 mg daily except 5 mg on Wednesdays.  Re-check in 4 weeks.  Anticoagulation Dose Instructions as of 12/14/2013      Dorene Grebe Tue Wed Thu Fri Sat   New Dose 2.5 mg 2.5 mg 2.5 mg 5 mg 2.5 mg 2.5 mg 2.5 mg    Description        Continue 2.5 mg daily except 5 mg on Wednesdays.  Re-check in 4 weeks.

## 2014-01-11 ENCOUNTER — Ambulatory Visit (INDEPENDENT_AMBULATORY_CARE_PROVIDER_SITE_OTHER): Payer: Medicare Other | Admitting: Family

## 2014-01-11 DIAGNOSIS — Z5181 Encounter for therapeutic drug level monitoring: Secondary | ICD-10-CM | POA: Diagnosis not present

## 2014-01-11 LAB — POCT INR: INR: 1.7

## 2014-01-11 NOTE — Patient Instructions (Signed)
Today, take 5 mg. Then Continue 2.5 mg daily except 5 mg on Wednesdays.  Re-check in 4 weeks.  Anticoagulation Dose Instructions as of 01/11/2014      Dorene Grebe Tue Wed Thu Fri Sat   New Dose 2.5 mg 2.5 mg 2.5 mg 5 mg 2.5 mg 2.5 mg 2.5 mg    Description        Today, take 5 mg. Then Continue 2.5 mg daily except 5 mg on Wednesdays.  Re-check in 4 weeks.

## 2014-02-10 ENCOUNTER — Ambulatory Visit (INDEPENDENT_AMBULATORY_CARE_PROVIDER_SITE_OTHER): Payer: Medicare Other | Admitting: Family

## 2014-02-10 DIAGNOSIS — I639 Cerebral infarction, unspecified: Secondary | ICD-10-CM

## 2014-02-10 DIAGNOSIS — Z5181 Encounter for therapeutic drug level monitoring: Secondary | ICD-10-CM

## 2014-02-10 LAB — POCT INR: INR: 3

## 2014-02-10 NOTE — Patient Instructions (Signed)
Continue 2.5 mg daily except 5 mg on Wednesdays.  Re-check in 4 weeks.  Anticoagulation Dose Instructions as of 02/10/2014      Dorene Grebe Tue Wed Thu Fri Sat   New Dose 2.5 mg 2.5 mg 2.5 mg 5 mg 2.5 mg 2.5 mg 2.5 mg    Description        Continue 2.5 mg daily except 5 mg on Wednesdays.  Re-check in 4 weeks.

## 2014-03-10 ENCOUNTER — Ambulatory Visit: Payer: Medicare Other | Admitting: Family

## 2014-04-28 ENCOUNTER — Telehealth: Payer: Self-pay | Admitting: Cardiology

## 2014-04-28 NOTE — Telephone Encounter (Signed)
Jose Bonilla called stating that he was interesting in changing doctors since Dr. Ron Parker is retiring. He is requesting Dr. Domenic Polite. Will verify with doctors on this.

## 2014-04-28 NOTE — Telephone Encounter (Signed)
This will be fine with me

## 2014-04-29 ENCOUNTER — Ambulatory Visit (INDEPENDENT_AMBULATORY_CARE_PROVIDER_SITE_OTHER): Payer: Medicare Other | Admitting: General Practice

## 2014-04-29 DIAGNOSIS — Z5181 Encounter for therapeutic drug level monitoring: Secondary | ICD-10-CM | POA: Diagnosis not present

## 2014-04-29 LAB — POCT INR: INR: 1.7

## 2014-04-29 NOTE — Progress Notes (Signed)
Pre visit review using our clinic review tool, if applicable. No additional management support is needed unless otherwise documented below in the visit note. 

## 2014-05-09 ENCOUNTER — Other Ambulatory Visit: Payer: Self-pay | Admitting: Family Medicine

## 2014-05-10 ENCOUNTER — Other Ambulatory Visit: Payer: Self-pay | Admitting: General Practice

## 2014-05-10 ENCOUNTER — Other Ambulatory Visit: Payer: Self-pay | Admitting: Family Medicine

## 2014-05-10 MED ORDER — WARFARIN SODIUM 2.5 MG PO TABS: ORAL_TABLET | ORAL | Status: DC

## 2014-05-27 ENCOUNTER — Ambulatory Visit (INDEPENDENT_AMBULATORY_CARE_PROVIDER_SITE_OTHER): Payer: Medicare Other | Admitting: General Practice

## 2014-05-27 DIAGNOSIS — Z5181 Encounter for therapeutic drug level monitoring: Secondary | ICD-10-CM

## 2014-05-27 DIAGNOSIS — I4891 Unspecified atrial fibrillation: Secondary | ICD-10-CM

## 2014-05-27 LAB — POCT INR: INR: 2.1

## 2014-05-27 NOTE — Progress Notes (Signed)
Pre visit review using our clinic review tool, if applicable. No additional management support is needed unless otherwise documented below in the visit note. 

## 2014-06-11 ENCOUNTER — Ambulatory Visit: Payer: Medicare Other | Admitting: Cardiology

## 2014-06-15 ENCOUNTER — Ambulatory Visit (INDEPENDENT_AMBULATORY_CARE_PROVIDER_SITE_OTHER): Payer: Medicare Other | Admitting: Cardiology

## 2014-06-15 ENCOUNTER — Encounter: Payer: Self-pay | Admitting: Cardiology

## 2014-06-15 VITALS — BP 123/83 | HR 87 | Ht 68.0 in | Wt 212.8 lb

## 2014-06-15 DIAGNOSIS — I429 Cardiomyopathy, unspecified: Secondary | ICD-10-CM

## 2014-06-15 DIAGNOSIS — I10 Essential (primary) hypertension: Secondary | ICD-10-CM | POA: Diagnosis not present

## 2014-06-15 DIAGNOSIS — I482 Chronic atrial fibrillation, unspecified: Secondary | ICD-10-CM

## 2014-06-15 DIAGNOSIS — I4891 Unspecified atrial fibrillation: Secondary | ICD-10-CM

## 2014-06-15 NOTE — Patient Instructions (Signed)
Your physician wants you to follow-up in: 6 months with DR. Domenic Polite You will receive a reminder letter in the mail two months in advance. If you don't receive a letter, please call our office to schedule the follow-up appointment.  Your physician recommends that you continue on your current medications as directed. Please refer to the Current Medication list given to you today.  Your physician has requested that you have an echocardiogram. Echocardiography is a painless test that uses sound waves to create images of your heart. It provides your doctor with information about the size and shape of your heart and how well your heart's chambers and valves are working. This procedure takes approximately one hour. There are no restrictions for this procedure.   Thank you for choosing [facility]!!

## 2014-06-15 NOTE — Progress Notes (Signed)
Cardiology Office Note  Date: 06/15/2014   ID: Jose Bonilla, DOB 1945/07/13, MRN [id number]  PCP: [name], MD  Primary Cardiologist: Rozann Lesches, MD   Chief Complaint  Patient presents with  . Atrial Fibrillation  . Cardiomyopathy    History of Present Illness: Jose Bonilla is a 69 y.o. male patient of Dr. Ron Parker that I am meeting in clinic for the first time today. I reviewed his cardiac history which is outlined below. He presents for a routine visit. History includes chronic atrial fibrillation, he has been managed with strategy of heart rate control and anticoagulation. From a symptom perspective, he does not report any regular sense of palpitations or breathlessness. He states that he has been on a carbohydrate restricted diet recently and trying to lose weight. 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  He has a history of cardiomyopathy in association with atrial fibrillation, felt to be nonischemic based on previous workup in 2013. Lexiscan Myoview at that time showed normal perfusion. He has not had a follow-up echocardiogram as yet.   He continues to follow in the anticoagulation clinic on Coumadin. He denies any major bleeding problems.  Past Medical History  Diagnosis Date  . Seasonal allergies   . Essential hypertension   . Chronic atrial fibrillation     Coumadin therapy  . CVA (cerebral infarction)     Possible history  . Gout   . Overweight(278.02)   . 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 No pertinent past surgical history.  Current Outpatient Prescriptions  Medication Sig Dispense Refill  . atenolol (TENORMIN) 100 MG tablet TAKE ONE-HALF TABLET BY MOUTH TWICE DAILY 90 tablet 0  . diltiazem (CARDIZEM CD) 180 MG 24 hr capsule Take 1 capsule (180 mg total) by mouth daily. 90 capsule 3  . lisinopril-hydrochlorothiazide (PRINZIDE,ZESTORETIC) 10-12.5 MG per tablet Take 1 tablet by mouth  daily. 90 tablet 3  . warfarin (COUMADIN) 2.5 MG tablet Take as directed buy anticoagulation clinic (Patient taking differently: Take as directed buy anticoagulation clinic MANAGED BY PMD.) 30 tablet 3   No current facility-administered medications for this visit.    Allergies:  Review of patient's allergies indicates no known allergies.   Social History: The patient  reports that he has never smoked. He does not have any smokeless tobacco history on file.    ROS:  Please see the history of present illness. Otherwise, complete review of systems is positive for none.  All other systems are reviewed and negative.   Physical Exam: VS:  BP 123/83 mmHg  Pulse 87  Ht 5\' 8"  (1.727 m)  Wt 212 lb 12.8 oz (96.525 kg)  BMI 32.36 kg/m2  SpO2 99%, BMI Body mass index is 32.36 kg/(m^2).  Wt Readings from Last 3 Encounters:  06/15/14 212 lb 12.8 oz (96.525 kg)  06/10/13 220 lb (99.791 kg)  06/08/13 224 lb (101.606 kg)     General: Overweight male, appears comfortable at rest. HEENT: Conjunctiva and lids normal, oropharynx clear. Neck: Supple, no elevated JVP or carotid bruits, no thyromegaly. Lungs: Clear to auscultation, nonlabored breathing at rest. Cardiac: Irregularly irregular, no S3 or significant systolic murmur, no pericardial rub. Abdomen: Soft, nontender, bowel sounds present, no guarding or rebound. Extremities: No pitting edema, distal pulses 2+. Skin: Warm and dry. Musculoskeletal: No kyphosis. Neuropsychiatric: Alert and oriented x3, affect grossly appropriate.   ECG: ECG is ordered today and reviewed finding atrial fibrillation at  94 bpm.  Other Studies Reviewed Today:  Echocardiogram 07/19/2011: Study Conclusions  - Left ventricle: The cavity size was normal. Systolic function was mildly to moderately reduced. The estimated ejection fraction was in the range of 40% to 45%. Wall motion was normal; there were no regional wall motion abnormalities. The study is  not technically sufficient to allow evaluation of LV diastolic function. - Aortic valve: Mildly calcified annulus. Mildly thickened, mildly calcified leaflets. Mild regurgitation. Valve area: 2.97cm^2(VTI). Valve area: 3.42cm^2 (Vmax). - Mitral valve: Mild regurgitation. - Left atrium: The atrium was mildly to moderately dilated. - Right ventricle: Systolic function was mildly to moderately reduced. - Right atrium: The atrium was mildly dilated. - Atrial septum: No defect or patent foramen ovale was identified. - Tricuspid valve: Moderate regurgitation.   Assessment and Plan:  1. Chronic atrial fibrillation diagnosed in 2013. Patient is symptomatically stable on current rate control regimen and also on Coumadin followed to the anticoagulation clinic. No changes were made today.  2. 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 A follow-up echocardiogram will be obtained.  3. Obesity, working on weight loss through carbohydrate restricted diet.  4. Essential hypertension. Blood pressure looks good today.  Current medicines were reviewed with the patient today.   Orders Placed This Encounter  Procedures  . EKG 12-Lead  . Echocardiogram    Disposition: FU with me in 6 months.   Signed, Satira Sark, MD, [facility] 06/15/2014 3:10 PM    [facility] [place], [place], [place] [address] Phone: [phone]; Fax: [phone]

## 2014-06-24 ENCOUNTER — Ambulatory Visit (INDEPENDENT_AMBULATORY_CARE_PROVIDER_SITE_OTHER): Payer: Medicare Other | Admitting: General Practice

## 2014-06-24 DIAGNOSIS — I4891 Unspecified atrial fibrillation: Secondary | ICD-10-CM | POA: Diagnosis not present

## 2014-06-24 DIAGNOSIS — Z5181 Encounter for therapeutic drug level monitoring: Secondary | ICD-10-CM | POA: Diagnosis not present

## 2014-06-24 LAB — POCT INR: INR: 2.2

## 2014-06-24 NOTE — Progress Notes (Signed)
Pre visit review using our clinic review tool, if applicable. No additional management support is needed unless otherwise documented below in the visit note. 

## 2014-07-22 ENCOUNTER — Other Ambulatory Visit: Payer: Medicare Other

## 2014-07-26 ENCOUNTER — Ambulatory Visit: Payer: Medicare Other

## 2014-08-16 ENCOUNTER — Ambulatory Visit (INDEPENDENT_AMBULATORY_CARE_PROVIDER_SITE_OTHER): Payer: Medicare Other | Admitting: Family Medicine

## 2014-08-16 ENCOUNTER — Ambulatory Visit (INDEPENDENT_AMBULATORY_CARE_PROVIDER_SITE_OTHER): Payer: Medicare Other | Admitting: General Practice

## 2014-08-16 ENCOUNTER — Other Ambulatory Visit: Payer: Self-pay | Admitting: Family Medicine

## 2014-08-16 ENCOUNTER — Encounter: Payer: Self-pay | Admitting: Family Medicine

## 2014-08-16 VITALS — BP 134/90 | HR 81 | Temp 97.5°F | Wt 217.0 lb

## 2014-08-16 DIAGNOSIS — I639 Cerebral infarction, unspecified: Secondary | ICD-10-CM

## 2014-08-16 DIAGNOSIS — M1000 Idiopathic gout, unspecified site: Secondary | ICD-10-CM | POA: Diagnosis not present

## 2014-08-16 DIAGNOSIS — I10 Essential (primary) hypertension: Secondary | ICD-10-CM | POA: Diagnosis not present

## 2014-08-16 DIAGNOSIS — E785 Hyperlipidemia, unspecified: Secondary | ICD-10-CM

## 2014-08-16 DIAGNOSIS — I4891 Unspecified atrial fibrillation: Secondary | ICD-10-CM

## 2014-08-16 DIAGNOSIS — Z5181 Encounter for therapeutic drug level monitoring: Secondary | ICD-10-CM | POA: Diagnosis not present

## 2014-08-16 LAB — LIPID PANEL
CHOL/HDL RATIO: 5
Cholesterol: 151 mg/dL (ref 0–200)
HDL: 27.5 mg/dL — ABNORMAL LOW (ref >39.00)
TRIGLYCERIDES: 440 mg/dL — AB (ref 0.0–149.0)

## 2014-08-16 LAB — URIC ACID: URIC ACID, SERUM: 10.4 mg/dL — AB (ref 4.0–7.8)

## 2014-08-16 LAB — BASIC METABOLIC PANEL
BUN: 17 mg/dL (ref 6–23)
CO2: 25 meq/L (ref 19–32)
Calcium: 9.1 mg/dL (ref 8.4–10.5)
Chloride: 102 mEq/L (ref 96–112)
Creatinine, Ser: 1.23 mg/dL (ref 0.40–1.50)
GFR: 61.76 mL/min (ref >60.00)
Glucose, Bld: 85 mg/dL (ref 70–99)
Potassium: 4.2 mEq/L (ref 3.5–5.1)
Sodium: 135 mEq/L (ref 135–145)

## 2014-08-16 LAB — HEPATIC FUNCTION PANEL
ALK PHOS: 75 U/L (ref 39–117)
ALT: 18 U/L (ref 0–53)
AST: 23 U/L (ref 0–37)
Albumin: 4 g/dL (ref 3.5–5.2)
BILIRUBIN TOTAL: 0.8 mg/dL (ref 0.2–1.2)
Bilirubin, Direct: 0.1 mg/dL (ref 0.0–0.3)
TOTAL PROTEIN: 6.8 g/dL (ref 6.0–8.3)

## 2014-08-16 LAB — POCT INR: INR: 1.6

## 2014-08-16 LAB — LDL CHOLESTEROL, DIRECT: LDL DIRECT: 67 mg/dL

## 2014-08-16 NOTE — Progress Notes (Signed)
   Subjective:    Patient ID: Jose Bonilla, male    DOB: [birth year], 69 y.o.   MRN: [id number]  HPI Patient has chronic prompt hypertension, remote history of stroke, gout, atrial fibrillation. He is seen today predominantly to discuss arthralgias. He has fairly diffuse arthralgias in multiple joints especially worse after prolonged periods of sitting or first getting up in the day. He does have chronic tophaceous deposits left elbow greater than right but no recent acute inflammatory changes. He has not had uric acid level checked in years. He currently does not take anything for gout.  His chronic medications include Coumadin, atenolol, diltiazem, and lisinopril HCTZ. He's tried to make some dietary modifications to reduce inflammation but is not seeing much change thus far.  Past Medical History  Diagnosis Date  . Seasonal allergies   . Essential hypertension   . Chronic atrial fibrillation     Coumadin therapy  . CVA (cerebral infarction)     Possible history  . Gout   . Overweight(278.02)   . 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  reports that he has never smoked. He does not have any smokeless tobacco history on file. His alcohol and drug histories are not on file. family history includes Diabetes in his paternal aunt; Heart disease in his mother; Hypertension in his father. No Known Allergies    Review of Systems  Constitutional: Negative for fatigue and unexpected weight change.  Eyes: Negative for visual disturbance.  Respiratory: Negative for cough, chest tightness and shortness of breath.   Cardiovascular: Negative for chest pain, palpitations and leg swelling.  Musculoskeletal: Positive for arthralgias.  Neurological: Negative for dizziness, syncope, weakness, light-headedness and headaches.       Objective:   Physical Exam  Constitutional: He appears well-developed and well-nourished.  Neck: Neck supple. No JVD  present.  Cardiovascular: Normal rate.   Irregular rhythm  Pulmonary/Chest: Effort normal and breath sounds normal. No respiratory distress. He has no wheezes. He has no rales.  Musculoskeletal:  Patient has tophaceous changes left elbow but no signs of acute inflammation such as warmth or erythema.          Assessment & Plan:  #1 history of gout. No acute inflammation but he has tophaceous changes left elbow. He has some generalized arthralgias and is concerned whether this may be related to gout. Discussed checking uric acid and if this is over 6 as suspected consider starting out all see if this makes a difference in his arthritic symptoms #2 hypertension stable #3 history of atrial fibrillation. Continue Coumadin

## 2014-08-16 NOTE — Progress Notes (Signed)
Pre visit review using our clinic review tool, if applicable. No additional management support is needed unless otherwise documented below in the visit note. 

## 2014-08-17 ENCOUNTER — Other Ambulatory Visit: Payer: Self-pay | Admitting: General Practice

## 2014-08-17 ENCOUNTER — Other Ambulatory Visit: Payer: Self-pay | Admitting: *Deleted

## 2014-08-17 MED ORDER — WARFARIN SODIUM 2.5 MG PO TABS: ORAL_TABLET | ORAL | Status: DC

## 2014-08-18 ENCOUNTER — Telehealth: Payer: Self-pay | Admitting: Family Medicine

## 2014-08-18 ENCOUNTER — Other Ambulatory Visit: Payer: Self-pay

## 2014-08-18 MED ORDER — ALLOPURINOL 100 MG PO TABS: ORAL_TABLET | ORAL | Status: DC

## 2014-08-18 NOTE — Telephone Encounter (Signed)
Pt would like blood work results °

## 2014-08-18 NOTE — Telephone Encounter (Signed)
Pt informed

## 2014-08-19 ENCOUNTER — Other Ambulatory Visit: Payer: Medicare Other

## 2014-08-20 ENCOUNTER — Telehealth: Payer: Self-pay | Admitting: Family Medicine

## 2014-08-20 NOTE — Telephone Encounter (Signed)
Pt said the pharmacist told him that taking the following med can interfere with warfarin   allopurinol (ZYLOPRIM) 100 MG tablet. While taking this med he may need to have his blood level check for clotting factor

## 2014-08-22 NOTE — Telephone Encounter (Signed)
Many drugs can affect coumadin. I suggest that he get his Protime checked within 2 weeks of starting the Allopurinol.

## 2014-08-23 NOTE — Telephone Encounter (Signed)
Pt informed

## 2014-09-01 ENCOUNTER — Encounter: Payer: Self-pay | Admitting: Family Medicine

## 2014-09-01 ENCOUNTER — Ambulatory Visit (INDEPENDENT_AMBULATORY_CARE_PROVIDER_SITE_OTHER): Payer: Medicare Other | Admitting: Family Medicine

## 2014-09-01 VITALS — BP 130/80 | HR 101 | Temp 97.3°F | Wt 216.0 lb

## 2014-09-01 DIAGNOSIS — I639 Cerebral infarction, unspecified: Secondary | ICD-10-CM

## 2014-09-01 DIAGNOSIS — M5441 Lumbago with sciatica, right side: Secondary | ICD-10-CM

## 2014-09-01 MED ORDER — TRAMADOL HCL 50 MG PO TABS: 50.0000 mg | ORAL_TABLET | Freq: Three times a day (TID) | ORAL | Status: DC | PRN

## 2014-09-01 NOTE — Progress Notes (Signed)
   Subjective:    Patient ID: Jose Bonilla, male    DOB: Jul 14, 1945, 69 y.o.   MRN: [id number]  HPI   acute visit. Patient seen with lumbar back pain radiating into the right buttock and down to approximately the right mid thigh. Pain is consistently worse with walking especially walking upright. Onset about 2 weeks ago. Denies any urine or stool incontinence. No weakness or numbness. 8 out of 10 severity at its worst. He is on Coumadin for history of atrial fibrillation and has been recently taking lots of Advil. No abdominal pain.  Past Medical History  Diagnosis Date  . Seasonal allergies   . Essential hypertension   . Chronic atrial fibrillation     Coumadin therapy  . CVA (cerebral infarction)     Possible history  . Gout   . Overweight(278.02)   . 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  reports that he has never smoked. He does not have any smokeless tobacco history on file. His alcohol and drug histories are not on file. family history includes Diabetes in his paternal aunt; Heart disease in his mother; Hypertension in his father. No Known Allergies    Review of Systems  Constitutional: Negative for fever, chills, appetite change and unexpected weight change.  Cardiovascular: Negative for chest pain.  Gastrointestinal: Negative for abdominal pain.  Genitourinary: Negative for dysuria.  Musculoskeletal: Positive for back pain.  Neurological: Negative for weakness and numbness.       Objective:   Physical Exam  Cardiovascular: Normal rate.   Pulmonary/Chest: Effort normal and breath sounds normal. No respiratory distress. He has no wheezes. He has no rales.  Musculoskeletal: He exhibits no edema.  Straight leg raise is negative bilaterally  Neurological:  Deep tendon reflexes are symmetric knee and ankle. Full-strength with plantar flexion dorsiflexion and knee extension.          Assessment & Plan:  Low back  pain. Suspect lumbar stenosis based on the fact his symptoms are consistently worse with ambulation-and relieved with rest. Nonfocal exam neurologically. Avoid regular use of Advil (or any other non-steroidal) as he is on Coumadin. Wrote for limited tramadol 50 mg one to 2 every 6 hours for severe pain #60 with no refill. Touch base of symptoms not improving over the next couple weeks. May need imaging to further assess

## 2014-09-01 NOTE — Progress Notes (Signed)
Pre visit review using our clinic review tool, if applicable. No additional management support is needed unless otherwise documented below in the visit note. 

## 2014-09-01 NOTE — Patient Instructions (Signed)
Sciatica Sciatica is pain, weakness, numbness, or tingling along the path of the sciatic nerve. The nerve starts in the lower back and runs down the back of each leg. The nerve controls the muscles in the lower leg and in the back of the knee, while also providing sensation to the back of the thigh, lower leg, and the sole of your foot. Sciatica is a symptom of another medical condition. For instance, nerve damage or certain conditions, such as a herniated disk or bone spur on the spine, pinch or put pressure on the sciatic nerve. This causes the pain, weakness, or other sensations normally associated with sciatica. Generally, sciatica only affects one side of the body. CAUSES   Herniated or slipped disc.  Degenerative disk disease.  A pain disorder involving the narrow muscle in the buttocks (piriformis syndrome).  Pelvic injury or fracture.  Pregnancy.  Tumor (rare). SYMPTOMS  Symptoms can vary from mild to very severe. The symptoms usually travel from the low back to the buttocks and down the back of the leg. Symptoms can include:  Mild tingling or dull aches in the lower back, leg, or hip.  Numbness in the back of the calf or sole of the foot.  Burning sensations in the lower back, leg, or hip.  Sharp pains in the lower back, leg, or hip.  Leg weakness.  Severe back pain inhibiting movement. These symptoms may get worse with coughing, sneezing, laughing, or prolonged sitting or standing. Also, being overweight may worsen symptoms. DIAGNOSIS  Your caregiver will perform a physical exam to look for common symptoms of sciatica. He or she may ask you to do certain movements or activities that would trigger sciatic nerve pain. Other tests may be performed to find the cause of the sciatica. These may include:  Blood tests.  X-rays.  Imaging tests, such as an MRI or CT scan. TREATMENT  Treatment is directed at the cause of the sciatic pain. Sometimes, treatment is not necessary  and the pain and discomfort goes away on its own. If treatment is needed, your caregiver may suggest:  Over-the-counter medicines to relieve pain.  Prescription medicines, such as anti-inflammatory medicine, muscle relaxants, or narcotics.  Applying heat or ice to the painful area.  Steroid injections to lessen pain, irritation, and inflammation around the nerve.  Reducing activity during periods of pain.  Exercising and stretching to strengthen your abdomen and improve flexibility of your spine. Your caregiver may suggest losing weight if the extra weight makes the back pain worse.  Physical therapy.  Surgery to eliminate what is pressing or pinching the nerve, such as a bone spur or part of a herniated disk. HOME CARE INSTRUCTIONS   Only take over-the-counter or prescription medicines for pain or discomfort as directed by your caregiver.  Apply ice to the affected area for 20 minutes, 3-4 times a day for the first 48-72 hours. Then try heat in the same way.  Exercise, stretch, or perform your usual activities if these do not aggravate your pain.  Attend physical therapy sessions as directed by your caregiver.  Keep all follow-up appointments as directed by your caregiver.  Do not wear high heels or shoes that do not provide proper support.  Check your mattress to see if it is too soft. A firm mattress may lessen your pain and discomfort. SEEK IMMEDIATE MEDICAL CARE IF:   You lose control of your bowel or bladder (incontinence).  You have increasing weakness in the lower back, pelvis, buttocks,   or legs.  You have redness or swelling of your back.  You have a burning sensation when you urinate.  You have pain that gets worse when you lie down or awakens you at night.  Your pain is worse than you have experienced in the past.  Your pain is lasting longer than 4 weeks.  You are suddenly losing weight without reason. MAKE SURE YOU:  Understand these  instructions.  Will watch your condition.  Will get help right away if you are not doing well or get worse. Document Released: 01/16/2001 Document Revised: 07/24/2011 Document Reviewed: 06/03/2011 Exit[employer] Patient Information 2015 [employer]. This information is not intended to replace advice given to you by your health care provider. Make sure you discuss any questions you have with your health care provider.  

## 2014-09-02 ENCOUNTER — Ambulatory Visit (INDEPENDENT_AMBULATORY_CARE_PROVIDER_SITE_OTHER): Payer: Medicare Other | Admitting: General Practice

## 2014-09-02 DIAGNOSIS — I4891 Unspecified atrial fibrillation: Secondary | ICD-10-CM | POA: Diagnosis not present

## 2014-09-02 DIAGNOSIS — Z5181 Encounter for therapeutic drug level monitoring: Secondary | ICD-10-CM

## 2014-09-02 LAB — POCT INR: INR: 2.2

## 2014-09-02 NOTE — Progress Notes (Signed)
Agree with assessment and plan 

## 2014-09-02 NOTE — Progress Notes (Signed)
Pre visit review using our clinic review tool, if applicable. No additional management support is needed unless otherwise documented below in the visit note. 

## 2014-09-09 ENCOUNTER — Ambulatory Visit (INDEPENDENT_AMBULATORY_CARE_PROVIDER_SITE_OTHER): Payer: Medicare Other

## 2014-09-09 ENCOUNTER — Other Ambulatory Visit: Payer: Self-pay

## 2014-09-09 DIAGNOSIS — I482 Chronic atrial fibrillation, unspecified: Secondary | ICD-10-CM

## 2014-09-09 DIAGNOSIS — I429 Cardiomyopathy, unspecified: Secondary | ICD-10-CM

## 2014-09-10 ENCOUNTER — Telehealth: Payer: Self-pay | Admitting: *Deleted

## 2014-09-10 NOTE — Telephone Encounter (Signed)
Patient informed. 

## 2014-09-10 NOTE — Telephone Encounter (Signed)
-----   Message from Satira Sark, MD sent at 09/09/2014  5:06 PM EDT ----- Reviewed report. Please let him know that LVEF has normalized, now in the range of 65-70% compared to prior assessment in 2013. He does have mild to moderate aortic regurgitation which is unlikely to be symptomatic, and we can follow for now on examination.

## 2014-09-13 ENCOUNTER — Ambulatory Visit: Payer: Medicare Other

## 2014-09-16 ENCOUNTER — Ambulatory Visit (INDEPENDENT_AMBULATORY_CARE_PROVIDER_SITE_OTHER): Payer: Medicare Other | Admitting: General Practice

## 2014-09-16 DIAGNOSIS — I4891 Unspecified atrial fibrillation: Secondary | ICD-10-CM

## 2014-09-16 DIAGNOSIS — Z5181 Encounter for therapeutic drug level monitoring: Secondary | ICD-10-CM

## 2014-09-16 LAB — POCT INR: INR: 1.9

## 2014-09-16 NOTE — Progress Notes (Signed)
Agree with recommendations.  

## 2014-09-16 NOTE — Progress Notes (Signed)
Pre visit review using our clinic review tool, if applicable. No additional management support is needed unless otherwise documented below in the visit note. 

## 2014-09-27 ENCOUNTER — Other Ambulatory Visit: Payer: Self-pay | Admitting: Family Medicine

## 2014-10-18 ENCOUNTER — Ambulatory Visit (INDEPENDENT_AMBULATORY_CARE_PROVIDER_SITE_OTHER): Payer: Medicare Other | Admitting: General Practice

## 2014-10-18 DIAGNOSIS — Z5181 Encounter for therapeutic drug level monitoring: Secondary | ICD-10-CM

## 2014-10-18 DIAGNOSIS — I4891 Unspecified atrial fibrillation: Secondary | ICD-10-CM

## 2014-10-18 LAB — POCT INR: INR: 2.3

## 2014-10-18 NOTE — Progress Notes (Signed)
Pre visit review using our clinic review tool, if applicable. No additional management support is needed unless otherwise documented below in the visit note. 

## 2014-10-18 NOTE — Progress Notes (Signed)
Agree with Coumadin management 

## 2014-11-04 ENCOUNTER — Other Ambulatory Visit: Payer: Self-pay | Admitting: Family Medicine

## 2014-11-08 ENCOUNTER — Encounter: Payer: Self-pay | Admitting: Adult Health

## 2014-11-08 ENCOUNTER — Ambulatory Visit (INDEPENDENT_AMBULATORY_CARE_PROVIDER_SITE_OTHER): Payer: Medicare Other | Admitting: Adult Health

## 2014-11-08 VITALS — BP 104/78 | Temp 97.8°F | Ht 68.0 in | Wt 220.9 lb

## 2014-11-08 DIAGNOSIS — M1009 Idiopathic gout, multiple sites: Secondary | ICD-10-CM

## 2014-11-08 DIAGNOSIS — I639 Cerebral infarction, unspecified: Secondary | ICD-10-CM

## 2014-11-08 DIAGNOSIS — M109 Gout, unspecified: Secondary | ICD-10-CM

## 2014-11-08 MED ORDER — PREDNISONE 20 MG PO TABS: 20.0000 mg | ORAL_TABLET | Freq: Every day | ORAL | Status: DC

## 2014-11-08 MED ORDER — INDOMETHACIN 50 MG PO CAPS: 50.0000 mg | ORAL_CAPSULE | Freq: Two times a day (BID) | ORAL | Status: DC

## 2014-11-08 NOTE — Patient Instructions (Addendum)
It was a pleasure meeting you today  Take the prednisone as direct   Day 1 40 mg Day 2 40 mg Day 3 40 mg Day 4 20 mg Day 5 20 mg Day 6 20 mg  Use the Indomethacin for pain and inflammation. You can take it three times a day. Do not take ibuprofen with this medication.   Follow up with Dr. Elease Hashimoto if no improvement.    Gout Gout is an inflammatory arthritis caused by a buildup of uric acid crystals in the joints. Uric acid is a chemical that is normally present in the blood. When the level of uric acid in the blood is too high it can form crystals that deposit in your joints and tissues. This causes joint redness, soreness, and swelling (inflammation). Repeat attacks are common. Over time, uric acid crystals can form into masses (tophi) near a joint, destroying bone and causing disfigurement. Gout is treatable and often preventable. CAUSES  The disease begins with elevated levels of uric acid in the blood. Uric acid is produced by your body when it breaks down a naturally found substance called purines. Certain foods you eat, such as meats and fish, contain high amounts of purines. Causes of an elevated uric acid level include:  Being passed down from parent to child (heredity).  Diseases that cause increased uric acid production (such as obesity, psoriasis, and certain cancers).  Excessive alcohol use.  Diet, especially diets rich in meat and seafood.  Medicines, including certain cancer-fighting medicines (chemotherapy), water pills (diuretics), and aspirin.  Chronic kidney disease. The kidneys are no longer able to remove uric acid well.  Problems with metabolism. Conditions strongly associated with gout include:  Obesity.  High blood pressure.  High cholesterol.  Diabetes. Not everyone with elevated uric acid levels gets gout. It is not understood why some people get gout and others do not. Surgery, joint injury, and eating too much of certain foods are some of the  factors that can lead to gout attacks. SYMPTOMS   An attack of gout comes on quickly. It causes intense pain with redness, swelling, and warmth in a joint.  Fever can occur.  Often, only one joint is involved. Certain joints are more commonly involved:  Base of the big toe.  Knee.  Ankle.  Wrist.  Finger. Without treatment, an attack usually goes away in a few days to weeks. Between attacks, you usually will not have symptoms, which is different from many other forms of arthritis. DIAGNOSIS  Your caregiver will suspect gout based on your symptoms and exam. In some cases, tests may be recommended. The tests may include:  Blood tests.  Urine tests.  X-rays.  Joint fluid exam. This exam requires a needle to remove fluid from the joint (arthrocentesis). Using a microscope, gout is confirmed when uric acid crystals are seen in the joint fluid. TREATMENT  There are two phases to gout treatment: treating the sudden onset (acute) attack and preventing attacks (prophylaxis).  Treatment of an Acute Attack.  Medicines are used. These include anti-inflammatory medicines or steroid medicines.  An injection of steroid medicine into the affected joint is sometimes necessary.  The painful joint is rested. Movement can worsen the arthritis.  You may use warm or cold treatments on painful joints, depending which works best for you.  Treatment to Prevent Attacks.  If you suffer from frequent gout attacks, your caregiver may advise preventive medicine. These medicines are started after the acute attack subsides. These medicines either  help your kidneys eliminate uric acid from your body or decrease your uric acid production. You may need to stay on these medicines for a very long time.  The early phase of treatment with preventive medicine can be associated with an increase in acute gout attacks. For this reason, during the first few months of treatment, your caregiver may also advise you  to take medicines usually used for acute gout treatment. Be sure you understand your caregiver's directions. Your caregiver may make several adjustments to your medicine dose before these medicines are effective.  Discuss dietary treatment with your caregiver or dietitian. Alcohol and drinks high in sugar and fructose and foods such as meat, poultry, and seafood can increase uric acid levels. Your caregiver or dietitian can advise you on drinks and foods that should be limited. HOME CARE INSTRUCTIONS   Do not take aspirin to relieve pain. This raises uric acid levels.  Only take over-the-counter or prescription medicines for pain, discomfort, or fever as directed by your caregiver.  Rest the joint as much as possible. When in bed, keep sheets and blankets off painful areas.  Keep the affected joint raised (elevated).  Apply warm or cold treatments to painful joints. Use of warm or cold treatments depends on which works best for you.  Use crutches if the painful joint is in your leg.  Drink enough fluids to keep your urine clear or pale yellow. This helps your body get rid of uric acid. Limit alcohol, sugary drinks, and fructose drinks.  Follow your dietary instructions. Pay careful attention to the amount of protein you eat. Your daily diet should emphasize fruits, vegetables, whole grains, and fat-free or low-fat milk products. Discuss the use of coffee, vitamin C, and cherries with your caregiver or dietitian. These may be helpful in lowering uric acid levels.  Maintain a healthy body weight. SEEK MEDICAL CARE IF:   You develop diarrhea, vomiting, or any side effects from medicines.  You do not feel better in 24 hours, or you are getting worse. SEEK IMMEDIATE MEDICAL CARE IF:   Your joint becomes suddenly more tender, and you have chills or a fever. MAKE SURE YOU:   Understand these instructions.  Will watch your condition.  Will get help right away if you are not doing well or  get worse. Document Released: 01/20/2000 Document Revised: 06/08/2013 Document Reviewed: 09/05/2011 [facility] Patient Information 2015 [place], [place]. This information is not intended to replace advice given to you by your health care provider. Make sure you discuss any questions you have with your health care provider.

## 2014-11-08 NOTE — Progress Notes (Signed)
Subjective:    Patient ID: Jose Bonilla, male    DOB: 19-Dec-1945, 69 y.o.   MRN: [id number]  HPI  69 year old male who presents to the office today for right arm pain and right hand swelling. He first noticed middle of last week, pain was mostly isolated to his right wrist. The pain has since migrated up his arm. He denies any trauma to the area but feels that though he might have over worked his arm when he was moving boxes at church last week. The swelling became worse over the weekend. He is taking Allopurinol and denies missing a dose. He denies drinking alcohol or changes in diet. Has been using Ibuprofen and Tramadol for pain, no improvement with these medications.     He is taking Zestoretic, which includes HCTZ, he has been taking this medication for some time.   He has had the same thing happen to him in   Review of Systems  Constitutional: Negative.   Respiratory: Negative.   Cardiovascular: Negative.   Musculoskeletal: Positive for myalgias and arthralgias.       Swelling to right hand  Skin: Negative.   Neurological: Negative.   All other systems reviewed and are negative.  Past Medical History  Diagnosis Date  . Seasonal allergies   . Essential hypertension   . Chronic atrial fibrillation (HCC)     Coumadin therapy  . CVA (cerebral infarction)     Possible history  . Gout   . Overweight(278.02)   . Secondary cardiomyopathy ([place])     LVEF 40-45% June 2013    Social History   Social History  . Marital Status: Married    Spouse Name: N/A  . Number of Children: N/A  . Years of Education: N/A   Occupational History  . Not on file.   Social History Main Topics  . Smoking status: Never Smoker   . Smokeless tobacco: Not on file  . Alcohol Use: Not on file  . Drug Use: Not on file  . Sexual Activity: Not on file   Other Topics Concern  . Not on file   Social History Narrative    No past surgical history on file.  Family History  Problem Relation  Age of Onset  . Heart disease Mother   . Hypertension Father   . Diabetes Paternal Aunt     No Known Allergies  Current Outpatient Prescriptions on File Prior to Visit  Medication Sig Dispense Refill  . allopurinol (ZYLOPRIM) 100 MG tablet TAKE ONE TABLET BY MOUTH ONCE DAILY FOR 14 DAYS,  THEN TAKE TWO ONCE DAILY FOR 14 DAYS, THEN TAKE  THREE ONCE DAILY 90 tablet 0  . atenolol (TENORMIN) 100 MG tablet TAKE ONE-HALF TABLET BY MOUTH TWICE DAILY (PATIENT NEEDS OFFICE VISIT) 90 tablet 0  . diltiazem (CARDIZEM CD) 180 MG 24 hr capsule TAKE ONE CAPSULE BY MOUTH ONCE DAILY 90 capsule 0  . lisinopril-hydrochlorothiazide (PRINZIDE,ZESTORETIC) 10-12.5 MG per tablet TAKE ONE TABLET BY MOUTH ONCE DAILY 90 tablet 0  . warfarin (COUMADIN) 2.5 MG tablet Take as directed buy anticoagulation clinic 40 tablet 12   No current facility-administered medications on file prior to visit.    BP 104/78 mmHg  Temp(Src) 97.8 F (36.6 C) (Oral)  Ht 5\' 8"  (1.727 m)  Wt 220 lb 14.4 oz (100.2 kg)  BMI 33.60 kg/m2       Objective:   Physical Exam  Constitutional: He is oriented to person, place, and time. He appears  well-developed and well-nourished. No distress.  Cardiovascular: Normal rate, regular rhythm, normal heart sounds and intact distal pulses.  Exam reveals no gallop and no friction rub.   No murmur heard. Pulmonary/Chest: Effort normal and breath sounds normal. No respiratory distress. He has no wheezes. He has no rales. He exhibits no tenderness.  Musculoskeletal: Normal range of motion. He exhibits edema and tenderness.  diffuse swelling of the right wrist with mild warmth but no erythema. He has some edema extending into the right hand. Moderately  tender to palpation of the wrist. . Elbow is nontender. Does not appear as cellulitis. Limited range of motion in right hand due to swelling and pain  Neurological: He is alert and oriented to person, place, and time.  Skin: Skin is warm and dry. No  rash noted. He is not diaphoretic. No erythema.  Psychiatric: He has a normal mood and affect. His behavior is normal. Judgment and thought content normal.  Nursing note and vitals reviewed.      Assessment & Plan:   1. Acute gout of multiple sites, unspecified cause - Very likely gout even though he is taking Allopurinol. There does not appear to be any evidence of infection.  - CBC with Differential/Platelet - Uric Acid - indomethacin (INDOCIN) 50 MG capsule; Take 1 capsule (50 mg total) by mouth 2 (two) times daily with a meal.  Dispense: 30 capsule; Refill: 3 - predniSONE (DELTASONE) 20 MG tablet; Take 1 tablet (20 mg total) by mouth daily with breakfast.  Dispense: 9 tablet; Refill: 0. Taper over six days.

## 2014-11-15 ENCOUNTER — Other Ambulatory Visit: Payer: Self-pay | Admitting: Family Medicine

## 2014-11-15 ENCOUNTER — Ambulatory Visit: Payer: Self-pay

## 2014-11-16 ENCOUNTER — Other Ambulatory Visit: Payer: Self-pay | Admitting: Family Medicine

## 2014-11-18 ENCOUNTER — Encounter: Payer: Self-pay | Admitting: Family Medicine

## 2014-11-18 ENCOUNTER — Ambulatory Visit (INDEPENDENT_AMBULATORY_CARE_PROVIDER_SITE_OTHER): Payer: Medicare Other | Admitting: Family Medicine

## 2014-11-18 ENCOUNTER — Ambulatory Visit (INDEPENDENT_AMBULATORY_CARE_PROVIDER_SITE_OTHER): Payer: Medicare Other | Admitting: General Practice

## 2014-11-18 VITALS — BP 124/80 | HR 72 | Temp 97.5°F | Ht 68.0 in | Wt 220.0 lb

## 2014-11-18 DIAGNOSIS — I639 Cerebral infarction, unspecified: Secondary | ICD-10-CM

## 2014-11-18 DIAGNOSIS — Z5181 Encounter for therapeutic drug level monitoring: Secondary | ICD-10-CM

## 2014-11-18 DIAGNOSIS — I4891 Unspecified atrial fibrillation: Secondary | ICD-10-CM

## 2014-11-18 DIAGNOSIS — M10041 Idiopathic gout, right hand: Secondary | ICD-10-CM

## 2014-11-18 DIAGNOSIS — M109 Gout, unspecified: Secondary | ICD-10-CM

## 2014-11-18 LAB — CBC WITH DIFFERENTIAL/PLATELET
Basophils Absolute: 0 10*3/uL (ref 0.0–0.1)
Basophils Relative: 0.2 % (ref 0.0–3.0)
EOS PCT: 4.3 % (ref 0.0–5.0)
Eosinophils Absolute: 0.3 10*3/uL (ref 0.0–0.7)
HCT: 39.3 % (ref 39.0–52.0)
Hemoglobin: 12.7 g/dL — ABNORMAL LOW (ref 13.0–17.0)
LYMPHS ABS: 1.1 10*3/uL (ref 0.7–4.0)
Lymphocytes Relative: 17.4 % (ref 12.0–46.0)
MCHC: 32.4 g/dL (ref 30.0–36.0)
MCV: 85 fl (ref 78.0–100.0)
MONOS PCT: 11.2 % (ref 3.0–12.0)
Monocytes Absolute: 0.7 10*3/uL (ref 0.1–1.0)
NEUTROS PCT: 66.9 % (ref 43.0–77.0)
Neutro Abs: 4.4 10*3/uL (ref 1.4–7.7)
Platelets: 194 10*3/uL (ref 150.0–400.0)
RBC: 4.62 Mil/uL (ref 4.22–5.81)
RDW: 17.1 % — ABNORMAL HIGH (ref 11.5–15.5)
WBC: 6.6 10*3/uL (ref 4.0–10.5)

## 2014-11-18 LAB — URIC ACID: Uric Acid, Serum: 7.2 mg/dL (ref 4.0–7.8)

## 2014-11-18 LAB — POCT INR: INR: 2.2

## 2014-11-18 MED ORDER — PREDNISONE 10 MG PO TABS: ORAL_TABLET | ORAL | Status: DC

## 2014-11-18 NOTE — Progress Notes (Signed)
Pre visit review using our clinic review tool, if applicable. No additional management support is needed unless otherwise documented below in the visit note. 

## 2014-11-18 NOTE — Progress Notes (Signed)
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 Fib  - INR was 2.2 today  ROS: See pertinent positives and negatives per HPI.  Past Medical History  Diagnosis Date  . Seasonal allergies   . Essential hypertension   . Chronic atrial fibrillation (HCC)     Coumadin therapy  . CVA (cerebral infarction)     Possible history  . Gout   . Overweight(278.02)   . Secondary cardiomyopathy ([place])     LVEF 40-45% June 2013    No past surgical history on file.  Family History  Problem Relation Age of Onset  . Heart disease Mother   . Hypertension Father   . Diabetes Paternal Aunt     Social History   Social History  . Marital Status: Married    Spouse Name: N/A  . Number of Children: N/A  . Years of Education: N/A   Social History Main Topics  . Smoking status: Never Smoker   . Smokeless tobacco: None  . Alcohol Use: None  . Drug Use: None  . Sexual Activity: Not Asked   Other Topics Concern  . None   Social History Narrative     Current outpatient prescriptions:  .  allopurinol (ZYLOPRIM) 100 MG tablet, TAKE ONE TABLET BY MOUTH ONCE DAILY FOR 14 DAYS,  THEN TAKE TWO ONCE DAILY FOR 14 DAYS, THEN TAKE  THREE ONCE DAILY, Disp: 90 tablet, Rfl: 0 .  atenolol (TENORMIN) 100 MG tablet, TAKE ONE-HALF TABLET BY MOUTH TWICE DAILY **NEED  OFFICE  VISIT**, Disp: 90 tablet, Rfl: 0 .  CARTIA XT 180 MG 24 hr capsule, TAKE ONE CAPSULE BY MOUTH  ONCE DAILY, Disp: 90 capsule, Rfl: 0 .  lisinopril-hydrochlorothiazide (PRINZIDE,ZESTORETIC) 10-12.5 MG per tablet, TAKE ONE TABLET BY MOUTH ONCE DAILY, Disp: 90 tablet, Rfl: 0 .  warfarin (COUMADIN) 2.5 MG tablet, Take as directed buy anticoagulation clinic, Disp: 40 tablet, Rfl: 12  EXAM:  Filed Vitals:   11/18/14 1412  BP: 124/80  Pulse: 72  Temp: 97.5 F (36.4 C)    Body mass index is 33.46 kg/(m^2).  GENERAL: vitals reviewed and listed above, alert, oriented, appears well hydrated and in no acute distress  HEENT: atraumatic, conjunttiva clear, no obvious abnormalities on inspection of external nose and ears  NECK: no obvious masses on inspection  LUNGS: clear to auscultation bilaterally, no wheezes, rales or rhonchi, good air movement  CV: irr irr, no peripheral edema  MS: moves all extremities without noticeable abnormality except for swelling of the 1st MCP joint of the R hand, normal cap refill, mild TTP, no sig warmth, normal strength  PSYCH: pleasant and cooperative, no obvious depression or anxiety  ASSESSMENT AND PLAN:  Discussed the following assessment and plan:  Acute gout of right hand, unspecified cause  -discussed potential for rebound of symptoms with short course of prednisone in recurrent gout -after discussion risks/benefits of options of treat  opted for longer course of oral prednisone with follow up in 1 week -Patient advised to return or notify a doctor immediately if symptoms worsen or persist or new concerns arise.  There are no Patient Instructions on file for this visit.   Colin Benton R.

## 2014-11-18 NOTE — Patient Instructions (Signed)
BEFORE YOU LEAVE: -schedule follow up with your doctor in 1 week  Please do the taper of prednisone to extend the course as insructed  Please notify your coumadin clinic that you are taking prednisone

## 2014-11-19 ENCOUNTER — Ambulatory Visit: Payer: Self-pay | Admitting: Adult Health

## 2014-11-25 ENCOUNTER — Ambulatory Visit: Payer: Medicare Other | Admitting: Family Medicine

## 2014-12-02 ENCOUNTER — Encounter: Payer: Self-pay | Admitting: Family Medicine

## 2014-12-02 ENCOUNTER — Ambulatory Visit (INDEPENDENT_AMBULATORY_CARE_PROVIDER_SITE_OTHER): Payer: Medicare Other | Admitting: Family Medicine

## 2014-12-02 VITALS — BP 100/70 | HR 87 | Temp 97.3°F | Wt 219.0 lb

## 2014-12-02 DIAGNOSIS — I10 Essential (primary) hypertension: Secondary | ICD-10-CM | POA: Diagnosis not present

## 2014-12-02 DIAGNOSIS — M10041 Idiopathic gout, right hand: Secondary | ICD-10-CM | POA: Diagnosis not present

## 2014-12-02 DIAGNOSIS — I639 Cerebral infarction, unspecified: Secondary | ICD-10-CM | POA: Diagnosis not present

## 2014-12-02 DIAGNOSIS — M109 Gout, unspecified: Secondary | ICD-10-CM

## 2014-12-02 MED ORDER — LISINOPRIL 10 MG PO TABS: 10.0000 mg | ORAL_TABLET | Freq: Every day | ORAL | Status: DC

## 2014-12-02 NOTE — Progress Notes (Signed)
Pre visit review using our clinic review tool, if applicable. No additional management support is needed unless otherwise documented below in the visit note. 

## 2014-12-02 NOTE — Patient Instructions (Signed)
STOP the Lisinopril HCTZ and START the Lisinopril one daily.

## 2014-12-02 NOTE — Progress Notes (Signed)
   Subjective:    Patient ID: Jose Bonilla, male    DOB: 1945/06/04, 69 y.o.   MRN: [id number]  HPI Patient seen for follow-up regarding gout. He takes allopurinol 300 mg once daily but had recent flare involving his right hand and wrist. He was placed on prednisone and was improving but he had rebound flare and this has resolved following second course of prednisone. He has tophaceous changes of the left elbow greater than the right. He states he is compliant with allopurinol therapy. He does take lisinopril HCTZ for hypertension. Blood pressures been very well controlled.  He is aware of dietary triggers for gout.  Past Medical History  Diagnosis Date  . Seasonal allergies   . Essential hypertension   . Chronic atrial fibrillation (HCC)     Coumadin therapy  . CVA (cerebral infarction)     Possible history  . Gout   . Overweight(278.02)   . Secondary cardiomyopathy ([place])     LVEF 40-45% June 2013   No past surgical history on file.  reports that he has never smoked. He does not have any smokeless tobacco history on file. His alcohol and drug histories are not on file. family history includes Diabetes in his paternal aunt; Heart disease in his mother; Hypertension in his father. Allergies  Allergen Reactions  . Indomethacin Other (See Comments)    Increased heart rate and elevated BP per patient      Review of Systems  Constitutional: Negative for fatigue.  Eyes: Negative for visual disturbance.  Respiratory: Negative for cough, chest tightness and shortness of breath.   Cardiovascular: Negative for chest pain, palpitations and leg swelling.  Neurological: Negative for dizziness, syncope, weakness, light-headedness and headaches.       Objective:   Physical Exam  Constitutional: He is oriented to person, place, and time. He appears well-developed and well-nourished.  HENT:  Right Ear: External ear normal.  Left Ear: External ear normal.  Mouth/Throat: Oropharynx is  clear and moist.  Eyes: Pupils are equal, round, and reactive to light.  Neck: Neck supple. No thyromegaly present.  Cardiovascular: Normal rate and regular rhythm.   Pulmonary/Chest: Effort normal and breath sounds normal. No respiratory distress. He has no wheezes. He has no rales.  Musculoskeletal: He exhibits no edema.  No acute inflammation involving the right hand or wrist. He has tophaceous changes involving the left elbow greater than the right but no warmth or erythema  Neurological: He is alert and oriented to person, place, and time.          Assessment & Plan:  Recurrent gout in a patient on allopurinol 300 mg daily with recent uric acid 7.2. Acute gout improved. Discontinue lisinopril HCTZ and start lisinopril 10 mg daily. Hopefully, getting him off HCTZ will help reduce risk of recurrence. Reassess 3 months. Repeat uric acid at that point and if not below 6 further titrate allopurinol.

## 2014-12-07 ENCOUNTER — Encounter: Payer: Self-pay | Admitting: Cardiology

## 2014-12-07 ENCOUNTER — Ambulatory Visit (INDEPENDENT_AMBULATORY_CARE_PROVIDER_SITE_OTHER): Payer: Medicare Other | Admitting: Cardiology

## 2014-12-07 VITALS — BP 134/89 | HR 85 | Ht 68.0 in | Wt 223.0 lb

## 2014-12-07 DIAGNOSIS — I482 Chronic atrial fibrillation, unspecified: Secondary | ICD-10-CM

## 2014-12-07 DIAGNOSIS — I429 Cardiomyopathy, unspecified: Secondary | ICD-10-CM

## 2014-12-07 DIAGNOSIS — I351 Nonrheumatic aortic (valve) insufficiency: Secondary | ICD-10-CM

## 2014-12-07 DIAGNOSIS — I639 Cerebral infarction, unspecified: Secondary | ICD-10-CM | POA: Diagnosis not present

## 2014-12-07 NOTE — Patient Instructions (Signed)
Your physician recommends that you continue on your current medications as directed. Please refer to the Current Medication list given to you today. Your physician recommends that you schedule a follow-up appointment in: 6 months. You will receive a reminder letter in the mail in about 4 months reminding you to call and schedule your appointment. If you don't receive this letter, please contact our office. 

## 2014-12-07 NOTE — Progress Notes (Signed)
Cardiology Office Note  Date: 12/07/2014   ID: Jose Bonilla, DOB Sep 15, 1945, MRN [id number]  PCP: [name], MD  Primary Cardiologist: Rozann Lesches, MD   Chief Complaint  Patient presents with  . Atrial Fibrillation  . Cardiomyopathy    History of Present Illness: Jose Bonilla is a 69 y.o. male last seen in May. He presents for a routine follow-up visit. Overall no change in stamina, NYHA class II dyspnea, and no significant palpitations reported. We reviewed his medications which are outlined below. He reports compliance with cardiac regimen.  He had a follow-up echocardiogram done back in August of this year that showed normalization of LVEF compared to prior study in 2013. He also has mild to moderate aortic regurgitation which is asymptomatic at this point. We discussed the results again today.  He states that he stays active, still works part time installing sound systems in churches.   Past Medical History  Diagnosis Date  . Seasonal allergies   . Essential hypertension   . Chronic atrial fibrillation (HCC)     Coumadin therapy  . CVA (cerebral infarction)     Possible history  . Gout   . Overweight(278.02)   . Secondary cardiomyopathy ([place])     LVEF 40-45% June 2013    Current Outpatient Prescriptions  Medication Sig Dispense Refill  . allopurinol (ZYLOPRIM) 100 MG tablet TAKE ONE TABLET BY MOUTH ONCE DAILY FOR 14 DAYS,  THEN TAKE TWO ONCE DAILY FOR 14 DAYS, THEN TAKE  THREE ONCE DAILY (Patient taking differently: take 3 tabs by mouth daily) 90 tablet 0  . atenolol (TENORMIN) 100 MG tablet TAKE ONE-HALF TABLET BY MOUTH TWICE DAILY **NEED  OFFICE  VISIT** 90 tablet 0  . CARTIA XT 180 MG 24 hr capsule TAKE ONE CAPSULE BY MOUTH ONCE DAILY 90 capsule 0  . lisinopril (PRINIVIL,ZESTRIL) 10 MG tablet Take 1 tablet (10 mg total) by mouth daily. 90 tablet 3  . warfarin (COUMADIN) 2.5 MG tablet Take as directed buy anticoagulation clinic 40 tablet 12   No  current facility-administered medications for this visit.    Allergies:  Indomethacin   Social History: The patient  reports that he has never smoked. He does not have any smokeless tobacco history on file.    ROS:  Please see the history of present illness. Otherwise, complete review of systems is positive for occasional gout flares.  All other systems are reviewed and negative.   Physical Exam: VS:  BP 134/89 mmHg  Pulse 85  Ht 5\' 8"  (1.727 m)  Wt 223 lb (101.152 kg)  BMI 33.91 kg/m2  SpO2 98%, BMI Body mass index is 33.91 kg/(m^2).  Wt Readings from Last 3 Encounters:  12/07/14 223 lb (101.152 kg)  12/02/14 219 lb (99.338 kg)  11/18/14 220 lb (99.791 kg)     General: Overweight male, appears comfortable at rest. HEENT: Conjunctiva and lids normal, oropharynx clear. Neck: Supple, no elevated JVP or carotid bruits, no thyromegaly. Lungs: Clear to auscultation, nonlabored breathing at rest. Cardiac: Irregularly irregular, no S3 or significant systolic murmur, no pericardial rub. Abdomen: Soft, nontender, bowel sounds present, no guarding or rebound. Extremities: No pitting edema, distal pulses 2+.   ECG: Tracing from 06/15/2014 showed rate-controlled atrial fibrillation.   Recent Labwork: 08/16/2014: ALT 18; AST 23; BUN 17; Creatinine, Ser 1.23; Potassium 4.2; Sodium 135 11/18/2014: Hemoglobin 12.7*; Platelets 194.0     Component Value Date/Time   CHOL 151 08/16/2014 1517   TRIG 440.0* 08/16/2014 1517  HDL 27.50* 08/16/2014 1517   CHOLHDL 5 08/16/2014 1517   VLDL 45.4* 05/10/2011 1302   LDLDIRECT 67.0 08/16/2014 1517    Other Studies Reviewed Today:  Echocardiogram 09/09/2014: Study Conclusions  - Left ventricle: The cavity size was normal. Wall thickness was increased increased in a pattern of mild to moderate LVH. Systolic function was vigorous. The estimated ejection fraction was in the range of 65% to 70%. The study was not technically sufficient to  allow evaluation of LV diastolic dysfunction due to atrial fibrillation. - Aortic valve: Mildly calcified annulus. Trileaflet; mildly thickened leaflets. There was mild to moderate regurgitation. Valve area (VTI): 3.41 cm^2. Valve area (Vmax): 2.99 cm^2. Valve area (Vmean): 2.69 cm^2. - Mitral valve: Mildly calcified annulus. Mildly thickened leaflets . There was mild regurgitation. - Left atrium: The atrium was severely dilated. - Right atrium: The atrium was moderately dilated. - Pulmonary arteries: PA peak pressure: 34 mm Hg (S). PASP is borderline elevated. - Technically adequate study.   Assessment and Plan:  1. Chronic atrial fibrillation. Continue strategy of heart rate control and anticoagulation.  2. History of nonischemic cardiomyopathy with normalization of LVEF by echocardiogram done earlier this year.  3. Mild to moderate aortic regurgitation, asymptomatic at this point.  Current medicines were reviewed with the patient today.  Disposition: FU with me in 6 months.   Signed, Satira Sark, MD, [facility] 12/07/2014 1:43 PM    [facility] [place], [place], [place] [address] Phone: [phone]; Fax: [phone]

## 2014-12-16 ENCOUNTER — Ambulatory Visit (INDEPENDENT_AMBULATORY_CARE_PROVIDER_SITE_OTHER): Payer: Medicare Other | Admitting: General Practice

## 2014-12-16 DIAGNOSIS — Z5181 Encounter for therapeutic drug level monitoring: Secondary | ICD-10-CM | POA: Diagnosis not present

## 2014-12-16 DIAGNOSIS — I4891 Unspecified atrial fibrillation: Secondary | ICD-10-CM

## 2014-12-16 LAB — POCT INR: INR: 1.4

## 2014-12-16 NOTE — Progress Notes (Signed)
Agree with coumadin management. 

## 2014-12-16 NOTE — Progress Notes (Signed)
Pre visit review using our clinic review tool, if applicable. No additional management support is needed unless otherwise documented below in the visit note. 

## 2014-12-20 ENCOUNTER — Other Ambulatory Visit: Payer: Self-pay | Admitting: Family Medicine

## 2015-01-13 ENCOUNTER — Ambulatory Visit (INDEPENDENT_AMBULATORY_CARE_PROVIDER_SITE_OTHER): Payer: Medicare Other | Admitting: General Practice

## 2015-01-13 DIAGNOSIS — I4891 Unspecified atrial fibrillation: Secondary | ICD-10-CM | POA: Diagnosis not present

## 2015-01-13 LAB — POCT INR: INR: 2.4

## 2015-01-13 NOTE — Progress Notes (Signed)
Pre visit review using our clinic review tool, if applicable. No additional management support is needed unless otherwise documented below in the visit note. 

## 2015-01-13 NOTE — Progress Notes (Signed)
Agree with Coumadin management 

## 2015-02-02 ENCOUNTER — Other Ambulatory Visit: Payer: Self-pay | Admitting: Family Medicine

## 2015-02-07 ENCOUNTER — Other Ambulatory Visit: Payer: Self-pay | Admitting: Family Medicine

## 2015-02-10 ENCOUNTER — Ambulatory Visit: Payer: Medicare Other

## 2015-02-17 ENCOUNTER — Ambulatory Visit (INDEPENDENT_AMBULATORY_CARE_PROVIDER_SITE_OTHER): Payer: Medicare Other | Admitting: General Practice

## 2015-02-17 DIAGNOSIS — I4891 Unspecified atrial fibrillation: Secondary | ICD-10-CM | POA: Diagnosis not present

## 2015-02-17 LAB — POCT INR: INR: 1.5

## 2015-02-17 NOTE — Progress Notes (Signed)
Pre visit review using our clinic review tool, if applicable. No additional management support is needed unless otherwise documented below in the visit note. Patient's INR is low today.  Asked patient to take extra 1/2 tablet for 2 days then resume current dosage.  Patient was unsure if yesterdays dosage was taken or not.  Encouraged patient to purchase and use a pill box to assure that correct dosage is being taken.  Patient verbalized understanding.

## 2015-02-23 ENCOUNTER — Other Ambulatory Visit: Payer: Self-pay | Admitting: Family Medicine

## 2015-02-24 ENCOUNTER — Other Ambulatory Visit: Payer: Self-pay | Admitting: Family Medicine

## 2015-03-14 DIAGNOSIS — L570 Actinic keratosis: Secondary | ICD-10-CM | POA: Diagnosis not present

## 2015-03-14 DIAGNOSIS — B360 Pityriasis versicolor: Secondary | ICD-10-CM | POA: Diagnosis not present

## 2015-03-14 DIAGNOSIS — D485 Neoplasm of uncertain behavior of skin: Secondary | ICD-10-CM | POA: Diagnosis not present

## 2015-03-14 DIAGNOSIS — L309 Dermatitis, unspecified: Secondary | ICD-10-CM | POA: Diagnosis not present

## 2015-03-16 ENCOUNTER — Ambulatory Visit: Payer: Medicare Other

## 2015-03-21 ENCOUNTER — Ambulatory Visit: Payer: Medicare Other

## 2015-03-23 LAB — POCT INR: INR: 2.3

## 2015-03-30 ENCOUNTER — Ambulatory Visit (INDEPENDENT_AMBULATORY_CARE_PROVIDER_SITE_OTHER): Payer: Medicare Other | Admitting: General Practice

## 2015-03-30 DIAGNOSIS — I4891 Unspecified atrial fibrillation: Secondary | ICD-10-CM

## 2015-03-30 DIAGNOSIS — Z5181 Encounter for therapeutic drug level monitoring: Secondary | ICD-10-CM | POA: Diagnosis not present

## 2015-03-30 NOTE — Progress Notes (Signed)
Pre visit review using our clinic review tool, if applicable. No additional management support is needed unless otherwise documented below in the visit note. 

## 2015-04-25 ENCOUNTER — Other Ambulatory Visit: Payer: Self-pay | Admitting: Family Medicine

## 2015-04-27 ENCOUNTER — Ambulatory Visit: Payer: Medicare Other

## 2015-05-02 ENCOUNTER — Other Ambulatory Visit: Payer: Self-pay | Admitting: Family Medicine

## 2015-05-11 ENCOUNTER — Ambulatory Visit (INDEPENDENT_AMBULATORY_CARE_PROVIDER_SITE_OTHER): Payer: Medicare Other | Admitting: General Practice

## 2015-05-11 DIAGNOSIS — I4891 Unspecified atrial fibrillation: Secondary | ICD-10-CM

## 2015-05-11 DIAGNOSIS — Z5181 Encounter for therapeutic drug level monitoring: Secondary | ICD-10-CM

## 2015-05-11 LAB — POCT INR: INR: 1.6

## 2015-05-11 NOTE — Progress Notes (Signed)
Pre visit review using our clinic review tool, if applicable. No additional management support is needed unless otherwise documented below in the visit note. 

## 2015-05-28 ENCOUNTER — Other Ambulatory Visit: Payer: Self-pay | Admitting: Family Medicine

## 2015-05-30 ENCOUNTER — Other Ambulatory Visit: Payer: Self-pay | Admitting: Family Medicine

## 2015-06-08 ENCOUNTER — Ambulatory Visit (INDEPENDENT_AMBULATORY_CARE_PROVIDER_SITE_OTHER): Payer: Medicare Other | Admitting: General Practice

## 2015-06-08 DIAGNOSIS — Z5181 Encounter for therapeutic drug level monitoring: Secondary | ICD-10-CM | POA: Diagnosis not present

## 2015-06-08 DIAGNOSIS — I4891 Unspecified atrial fibrillation: Secondary | ICD-10-CM | POA: Diagnosis not present

## 2015-06-08 LAB — POCT INR: INR: 2.3

## 2015-06-08 NOTE — Progress Notes (Signed)
Pre visit review using our clinic review tool, if applicable. No additional management support is needed unless otherwise documented below in the visit note. 

## 2015-06-20 ENCOUNTER — Other Ambulatory Visit: Payer: Self-pay | Admitting: Family Medicine

## 2015-07-06 ENCOUNTER — Ambulatory Visit: Payer: Medicare Other

## 2015-07-13 ENCOUNTER — Ambulatory Visit: Payer: Medicare Other

## 2015-07-18 ENCOUNTER — Other Ambulatory Visit: Payer: Self-pay | Admitting: Family Medicine

## 2015-08-29 ENCOUNTER — Telehealth: Payer: Self-pay | Admitting: Family Medicine

## 2015-08-29 MED ORDER — ALLOPURINOL 100 MG PO TABS: ORAL_TABLET | ORAL | 0 refills | Status: DC

## 2015-08-29 NOTE — Telephone Encounter (Signed)
30 day supply sent into pharmacy.

## 2015-08-29 NOTE — Telephone Encounter (Signed)
Pt request refill  allopurinol (ZYLOPRIM) 100 MG tablet  Pt has made appointment for 8/02 and realizes he needs appointment for refill .  but hopes to get refill to get him through to appointment. Pt works out of town and will not be back before then.   [facility]/ [place]

## 2015-09-07 ENCOUNTER — Other Ambulatory Visit: Payer: Self-pay | Admitting: General Practice

## 2015-09-07 ENCOUNTER — Encounter: Payer: Self-pay | Admitting: Family Medicine

## 2015-09-07 ENCOUNTER — Ambulatory Visit (INDEPENDENT_AMBULATORY_CARE_PROVIDER_SITE_OTHER): Payer: Medicare Other | Admitting: Family Medicine

## 2015-09-07 ENCOUNTER — Ambulatory Visit (INDEPENDENT_AMBULATORY_CARE_PROVIDER_SITE_OTHER): Payer: Medicare Other | Admitting: General Practice

## 2015-09-07 VITALS — BP 89/70 | HR 87 | Temp 97.6°F | Ht 68.0 in | Wt 219.1 lb

## 2015-09-07 DIAGNOSIS — I10 Essential (primary) hypertension: Secondary | ICD-10-CM | POA: Diagnosis not present

## 2015-09-07 DIAGNOSIS — I4891 Unspecified atrial fibrillation: Secondary | ICD-10-CM

## 2015-09-07 DIAGNOSIS — M10041 Idiopathic gout, right hand: Secondary | ICD-10-CM

## 2015-09-07 DIAGNOSIS — Z5181 Encounter for therapeutic drug level monitoring: Secondary | ICD-10-CM

## 2015-09-07 DIAGNOSIS — M109 Gout, unspecified: Secondary | ICD-10-CM

## 2015-09-07 LAB — POCT INR: INR: 2.4

## 2015-09-07 MED ORDER — WARFARIN SODIUM 2.5 MG PO TABS: ORAL_TABLET | ORAL | 1 refills | Status: DC

## 2015-09-07 MED ORDER — ATENOLOL 100 MG PO TABS: ORAL_TABLET | ORAL | 2 refills | Status: DC

## 2015-09-07 MED ORDER — ALLOPURINOL 100 MG PO TABS: ORAL_TABLET | ORAL | 0 refills | Status: DC

## 2015-09-07 MED ORDER — LISINOPRIL-HYDROCHLOROTHIAZIDE 10-12.5 MG PO TABS: 1.0000 | ORAL_TABLET | Freq: Every day | ORAL | 2 refills | Status: DC

## 2015-09-07 MED ORDER — LISINOPRIL 10 MG PO TABS: 10.0000 mg | ORAL_TABLET | Freq: Every day | ORAL | 2 refills | Status: DC

## 2015-09-07 MED ORDER — DILTIAZEM HCL ER COATED BEADS 180 MG PO CP24
180.0000 mg | ORAL_CAPSULE | Freq: Every day | ORAL | 2 refills | Status: DC
Start: 2015-09-07 — End: 2016-06-15

## 2015-09-07 NOTE — Progress Notes (Signed)
Subjective:     Patient ID: Jose Bonilla, male   DOB: May 16, 1945, 70 y.o.   MRN: [id number]  HPI Patient seen for medical follow-up. He has history of hypertension, gout, atrial fibrillation. In reviewing his medications, he apparently has been alternating lisinopril with lisinopril HCTZ. He is on allopurinol for gout prevention and has not had any flareups over the past year. His blood pressure been very well controlled. He remains on Coumadin for atrial fibrillation. No bleeding complications. He has done excellent job with regular follow-up for his Coumadin checks.  He's been very active with gardening No chest pains. No dizziness. Compliant with medications. Denies medication side effects  Past Medical History:  Diagnosis Date  . Chronic atrial fibrillation (HCC)    Coumadin therapy  . CVA (cerebral infarction)    Possible history  . Essential hypertension   . Gout   . Overweight(278.02)   . Seasonal allergies   . Secondary cardiomyopathy ([place])    LVEF 40-45% June 2013   No past surgical history on file.  reports that he has never smoked. He does not have any smokeless tobacco history on file. His alcohol and drug histories are not on file. family history includes Diabetes in his paternal aunt; Heart disease in his mother; Hypertension in his father. Allergies  Allergen Reactions  . Indomethacin Other (See Comments)    Increased heart rate and elevated BP per patient     Review of Systems  Constitutional: Negative for fatigue.  Eyes: Negative for visual disturbance.  Respiratory: Negative for cough, chest tightness and shortness of breath.   Cardiovascular: Negative for chest pain, palpitations and leg swelling.  Neurological: Negative for dizziness, syncope, weakness, light-headedness and headaches.       Objective:   Physical Exam  Constitutional: He is oriented to person, place, and time. He appears well-developed and well-nourished.  HENT:  Right Ear: External ear  normal.  Left Ear: External ear normal.  Mouth/Throat: Oropharynx is clear and moist.  Eyes: Pupils are equal, round, and reactive to light.  Neck: Neck supple. No thyromegaly present.  Cardiovascular: Normal rate.   Irregular rhythm but rate controlled  Pulmonary/Chest: Effort normal and breath sounds normal. No respiratory distress. He has no wheezes. He has no rales.  Musculoskeletal: He exhibits no edema.  Neurological: He is alert and oriented to person, place, and time.       Assessment:     #1 hypertension. Very well controlled.  For some reason, patient is on lisinopril HCTZ and plain lisinopril apparently has been alternating these.  #2 gout. Has done well with prophylaxis with low-dose allopurinol  #3 atrial fibrillation. Patient on Coumadin. Rate controlled    Plan:     -Set up physical and we'll get more extensive screening labs then -Stop lisinopril HCTZ -Continue regular lisinopril along with other medications  [name] MD [facility]

## 2015-09-07 NOTE — Progress Notes (Signed)
Pre visit review using our clinic review tool, if applicable. No additional management support is needed unless otherwise documented below in the visit note. 

## 2015-09-07 NOTE — Patient Instructions (Signed)
Set up physical at some point this year Stop the [place] on the regular Lisinopril.

## 2015-09-30 ENCOUNTER — Other Ambulatory Visit: Payer: Self-pay

## 2015-10-19 ENCOUNTER — Ambulatory Visit: Payer: Medicare Other

## 2015-10-24 ENCOUNTER — Ambulatory Visit (INDEPENDENT_AMBULATORY_CARE_PROVIDER_SITE_OTHER): Payer: Medicare Other | Admitting: General Practice

## 2015-10-24 ENCOUNTER — Ambulatory Visit: Payer: Medicare Other

## 2015-10-24 DIAGNOSIS — I4891 Unspecified atrial fibrillation: Secondary | ICD-10-CM

## 2015-10-24 DIAGNOSIS — Z5181 Encounter for therapeutic drug level monitoring: Secondary | ICD-10-CM

## 2015-10-24 LAB — POCT INR: INR: 2.1

## 2015-11-28 DIAGNOSIS — D239 Other benign neoplasm of skin, unspecified: Secondary | ICD-10-CM | POA: Diagnosis not present

## 2015-11-28 DIAGNOSIS — L570 Actinic keratosis: Secondary | ICD-10-CM | POA: Diagnosis not present

## 2015-12-05 ENCOUNTER — Ambulatory Visit: Payer: Medicare Other

## 2016-01-03 ENCOUNTER — Encounter: Payer: Self-pay | Admitting: Adult Health

## 2016-01-03 ENCOUNTER — Ambulatory Visit (INDEPENDENT_AMBULATORY_CARE_PROVIDER_SITE_OTHER): Payer: Medicare Other | Admitting: Adult Health

## 2016-01-03 VITALS — BP 126/74 | Temp 97.4°F | Ht 68.0 in | Wt 226.8 lb

## 2016-01-03 DIAGNOSIS — N3001 Acute cystitis with hematuria: Secondary | ICD-10-CM | POA: Diagnosis not present

## 2016-01-03 LAB — POCT URINALYSIS DIPSTICK
BILIRUBIN UA: NEGATIVE
GLUCOSE UA: NEGATIVE
Ketones, UA: NEGATIVE
LEUKOCYTES UA: NEGATIVE
Spec Grav, UA: 1.025
Urobilinogen, UA: NEGATIVE
pH, UA: 6

## 2016-01-03 MED ORDER — SULFAMETHOXAZOLE-TRIMETHOPRIM 800-160 MG PO TABS: 1.0000 | ORAL_TABLET | Freq: Two times a day (BID) | ORAL | 0 refills | Status: DC

## 2016-01-03 NOTE — Progress Notes (Signed)
[id number] W, MD No chief complaint on file.   Current Issues:  Presents with 30  days of dysuria and urinary frequency Associated symptoms include:  dysuria, fever ( subjective)  and urinary frequency  There is no history of of similar symptoms.   Prior to Admission medications   Medication Sig Start Date End Date Taking? Authorizing Provider  allopurinol (ZYLOPRIM) 100 MG tablet take 3 tabs by mouth daily 09/07/15   [name], MD  atenolol (TENORMIN) 100 MG tablet TAKE ONE-HALF TABLET BY MOUTH TWICE DAILY 09/07/15   [name], MD  diltiazem (CARTIA XT) 180 MG 24 hr capsule Take 1 capsule (180 mg total) by mouth daily. 09/07/15   [name], MD  lisinopril (PRINIVIL,ZESTRIL) 10 MG tablet Take 1 tablet (10 mg total) by mouth daily. 09/07/15   [name], MD  warfarin (COUMADIN) 2.5 MG tablet Take as directed buy anticoagulation clinic 09/07/15   [name], MD    Review of Systems:Negative unless mentioned above   PE:  BP 126/74   Temp 97.4 F (36.3 C) (Oral)   Ht 5\' 8"  (1.727 m)   Wt 226 lb 12.8 oz (102.9 kg)   BMI 34.48 kg/m  Constitutional: Alert and oriented. Does not appear in any acute distress Heart: Normal rate and rhythm. No m/r/g Lungs: Clear to auscultation  Back" No CVA tenderness Abdomen: No pain with palpation. Normal bowel sounds. Ventral hernia present  Results for orders placed or performed in visit on 10/24/15  POCT INR  Result Value Ref Range   INR 2.1     Assessment and Plan:    1. Acute cystitis with hematuria - Urine culture - sulfamethoxazole-trimethoprim (BACTRIM DS,SEPTRA DS) 800-160 MG tablet; Take 1 tablet by mouth 2 (two) times daily.  Dispense: 28 tablet; Refill: 0 - POC Urinalysis Dipstick, + nit, blood - Stay hydrated - Follow up if no improvement  Dorothyann Peng, NP

## 2016-01-03 NOTE — Patient Instructions (Signed)
Urinary Tract Infection, Adult A urinary tract infection (UTI) is an infection of any part of the urinary tract, which includes the kidneys, ureters, bladder, and urethra. These organs make, store, and get rid of urine in the body. UTI can be a bladder infection (cystitis) or kidney infection (pyelonephritis). What are the causes? This infection may be caused by fungi, viruses, or bacteria. Bacteria are the most common cause of UTIs. This condition can also be caused by repeated incomplete emptying of the bladder during urination. What increases the risk? This condition is more likely to develop if:  You ignore your need to urinate or hold urine for long periods of time.  You do not empty your bladder completely during urination.  You wipe back to front after urinating or having a bowel movement, if you are male.  You are uncircumcised, if you are male.  You are constipated.  You have a urinary catheter that stays in place (indwelling).  You have a weak defense (immune) system.  You have a medical condition that affects your bowels, kidneys, or bladder.  You have diabetes.  You take antibiotic medicines frequently or for long periods of time, and the antibiotics no longer work well against certain types of infections (antibiotic resistance).  You take medicines that irritate your urinary tract.  You are exposed to chemicals that irritate your urinary tract.  You are male. What are the signs or symptoms? Symptoms of this condition include:  Fever.  Frequent urination or passing small amounts of urine frequently.  Needing to urinate urgently.  Pain or burning with urination.  Urine that smells bad or unusual.  Cloudy urine.  Pain in the lower abdomen or back.  Trouble urinating.  Blood in the urine.  Vomiting or being less hungry than normal.  Diarrhea or abdominal pain.  Vaginal discharge, if you are male. How is this diagnosed? This condition is  diagnosed with a medical history and physical exam. You will also need to provide a urine sample to test your urine. Other tests may be done, including:  Blood tests.  Sexually transmitted disease (STD) testing. If you have had more than one UTI, a cystoscopy or imaging studies may be done to determine the cause of the infections. How is this treated? Treatment for this condition often includes a combination of two or more of the following:  Antibiotic medicine.  Other medicines to treat less common causes of UTI.  Over-the-counter medicines to treat pain.  Drinking enough water to stay hydrated. Follow these instructions at home:  Take over-the-counter and prescription medicines only as told by your health care provider.  If you were prescribed an antibiotic, take it as told by your health care provider. Do not stop taking the antibiotic even if you start to feel better.  Avoid alcohol, caffeine, tea, and carbonated beverages. They can irritate your bladder.  Drink enough fluid to keep your urine clear or pale yellow.  Keep all follow-up visits as told by your health care provider. This is important.  Make sure to:  Empty your bladder often and completely. Do not hold urine for long periods of time.  Empty your bladder before and after sex.  Wipe from front to back after a bowel movement if you are male. Use each tissue one time when you wipe. Contact a health care provider if:  You have back pain.  You have a fever.  You feel nauseous or vomit.  Your symptoms do not get better after 3   days.  Your symptoms go away and then return. Get help right away if:  You have severe back pain or lower abdominal pain.  You are vomiting and cannot keep down any medicines or water. This information is not intended to replace advice given to you by your health care provider. Make sure you discuss any questions you have with your health care provider. Document Released:  11/01/2004 Document Revised: 07/06/2015 Document Reviewed: 12/13/2014 Elsevier Interactive Patient Education  2017 [employer]  

## 2016-01-05 LAB — URINE CULTURE: ORGANISM ID, BACTERIA: NO GROWTH

## 2016-02-02 ENCOUNTER — Other Ambulatory Visit: Payer: Self-pay | Admitting: Family Medicine

## 2016-02-04 ENCOUNTER — Ambulatory Visit (INDEPENDENT_AMBULATORY_CARE_PROVIDER_SITE_OTHER): Payer: Medicare Other | Admitting: Internal Medicine

## 2016-02-04 ENCOUNTER — Encounter: Payer: Self-pay | Admitting: Internal Medicine

## 2016-02-04 VITALS — BP 140/82 | HR 85 | Resp 20 | Wt 221.0 lb

## 2016-02-04 DIAGNOSIS — I10 Essential (primary) hypertension: Secondary | ICD-10-CM | POA: Diagnosis not present

## 2016-02-04 DIAGNOSIS — M1A9XX1 Chronic gout, unspecified, with tophus (tophi): Secondary | ICD-10-CM

## 2016-02-04 DIAGNOSIS — M109 Gout, unspecified: Secondary | ICD-10-CM

## 2016-02-04 MED ORDER — PREDNISONE 10 MG PO TABS: ORAL_TABLET | ORAL | 0 refills | Status: DC

## 2016-02-04 MED ORDER — ALLOPURINOL 100 MG PO TABS: ORAL_TABLET | ORAL | 5 refills | Status: DC

## 2016-02-04 NOTE — Progress Notes (Signed)
   Subjective:    Patient ID: Jose Bonilla, male    DOB: [birth year], 70 y.o.   MRN: [id number]  HPI  Here with c/o acute onset left ankle pain/red/swelling/tender x 1 wk without trauma or fever.   + hx of remote left first mtp gout 15 yrs ago.  Accidentally stopped taking the allopurinol x 4 mo, now realizes he needs to restart. Pt denies chest pain, increased sob or doe, wheezing, orthopnea, PND, increased LE swelling, palpitations, dizziness or syncope.   Pt denies polydipsia, polyuria Past Medical History:  Diagnosis Date  . Chronic atrial fibrillation (HCC)    Coumadin therapy  . CVA (cerebral infarction)    Possible history  . Essential hypertension   . Gout   . Overweight(278.02)   . Seasonal allergies   . Secondary cardiomyopathy ([place])    LVEF 40-45% June 2013   No past surgical history on file.  reports that he has never smoked. He does not have any smokeless tobacco history on file. His alcohol and drug histories are not on file. family history includes Diabetes in his paternal aunt; Heart disease in his mother; Hypertension in his father. Allergies  Allergen Reactions  . Indomethacin Other (See Comments)    Increased heart rate and elevated BP per patient   Current Outpatient Prescriptions on File Prior to Visit  Medication Sig Dispense Refill  . allopurinol (ZYLOPRIM) 100 MG tablet TAKE ONE TABLET BY MOUTH ONCE DAILY FOR 14 DAYS AND TWO ONCE DAILY FOR 14 DAYS AND THREE ONCE DAILY 90 tablet 1  . atenolol (TENORMIN) 100 MG tablet TAKE ONE-HALF TABLET BY MOUTH TWICE DAILY 90 tablet 2  . diltiazem (CARTIA XT) 180 MG 24 hr capsule Take 1 capsule (180 mg total) by mouth daily. 90 capsule 2  . lisinopril (PRINIVIL,ZESTRIL) 10 MG tablet Take 1 tablet (10 mg total) by mouth daily. 90 tablet 2  . sulfamethoxazole-trimethoprim (BACTRIM DS,SEPTRA DS) 800-160 MG tablet Take 1 tablet by mouth 2 (two) times daily. 28 tablet 0  . warfarin (COUMADIN) 2.5 MG tablet Take as directed buy  anticoagulation clinic 105 tablet 1   No current facility-administered medications on file prior to visit.    Review of Systems All otherwise neg per pt     Objective:   Physical Exam        Assessment & Plan:

## 2016-02-04 NOTE — Progress Notes (Signed)
Pre visit review using our clinic review tool, if applicable. No additional management support is needed unless otherwise documented below in the visit note. 

## 2016-02-04 NOTE — Patient Instructions (Signed)
You had the steroid shot today  Please take all new medication as prescribed - the prednisone  Please continue all other medications as before, including restart the allopurinol  Please have the pharmacy call with any other refills you may need.  Please keep your appointments with your specialists as you may have planned

## 2016-02-05 DIAGNOSIS — M1A9XX0 Chronic gout, unspecified, without tophus (tophi): Secondary | ICD-10-CM | POA: Insufficient documentation

## 2016-02-05 DIAGNOSIS — M109 Gout, unspecified: Secondary | ICD-10-CM | POA: Insufficient documentation

## 2016-02-05 NOTE — Assessment & Plan Note (Signed)
Mild to mod, for depomedrol IM 80, predpac asd, to f/u any worsening symptoms or concerns 

## 2016-02-05 NOTE — Assessment & Plan Note (Signed)
To restart the allpurinol with resolution of current symtpoms,  to f/u any worsening symptoms or concerns

## 2016-02-05 NOTE — Assessment & Plan Note (Signed)
stable overall by history and exam, recent data reviewed with pt, and pt to continue medical treatment as before,  to f/u any worsening symptoms or concerns BP Readings from Last 3 Encounters:  02/04/16 140/82  01/03/16 126/74  09/07/15 (!) 89/70

## 2016-02-27 ENCOUNTER — Ambulatory Visit: Payer: Medicare Other

## 2016-03-08 ENCOUNTER — Ambulatory Visit: Payer: Medicare Other

## 2016-03-13 ENCOUNTER — Ambulatory Visit: Payer: Medicare Other

## 2016-03-13 NOTE — Progress Notes (Addendum)
Subjective:   Jose Bonilla is a 71 y.o. male who presents for Medicare Annual/Subsequent preventive examination.  The Patient was informed that the wellness visit is to identify future health risk and educate and initiate measures that can reduce risk for increased disease through the lifespan.    NO ROS; Medicare Wellness Visit   Describes health as good, fair or great? 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  No residual and states his bp went up and had an episode lasting a few minutes   HRA completed during the assessment     The following written screening schedule of preventive measures were reviewed with assessment and plan made per below and patient instructions:  Smoking history reviewed - never smoked  Use of Smokeless tobacco no Second Hand Smoke status; No Smokers in the home  Dental fup yes   ETOH  No   RISK FACTORS Regular exercise-loves to garden  Finds this very relaxing  Usually fairly active Has his own business; [employer]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  Doesn't eat a lot of sweets Loves bread though  Can lose weight if he stops eating so much  bread Given information on making good food choices  Tires to not eat sodium  incorporates fruits and vegetables; yes   Obesity and weight loss plan discussed  Plan in place  Lipids reviewed and reducing cholesterol discussed  HDL 27; LDL 67; trig 440  Given copy of understanding lipids with his numbers  Referral to Diabetes and Nutritional center if appropriate   Cardiac risk Hx mother and father; mother was 71 and had blood clot p surgery  Father; had  Brothers and sisters; 2 of each; are much older than him  Hx of CVA remote  Cardiomyopathy- atrial fib hx.  Overweight will work on weight  Labs:reviewed and needs to be repeated HTN; elevated today    Fall risk: presented mobile; get up and go WNL   Mobility of Functional changes this year? No states it is actually better  Depression Screen PhQ 2: negative  Activities of Daily Living - See functional screen   Cognitive testing; Ad8 score; 0 or less than 2  MMSE deferred or completed if AD8 + 2 issues  Advanced Directives reviewed for completion; discussion with MD as well as supportive resources as needed  Patient Care Team: [name], MD as PCP - General (Family Medicine) Dr. Domenic Polite; cardiology   Preventives screens reviewed Colonoscopy 02/2001 was due 02/2011  PSA deferred for physical    There is no immunization history for the selected administration types on file for this patient. Required Immunizations needed today  Screening test up to date or reviewed for plan of completion Health Maintenance Due  Topic Date Due  . Hepatitis C Screening  Jun 16, 1945  . TETANUS/TDAP  03/30/1963  . ZOSTAVAX  03/29/2004  . PNA vac Low Risk Adult (1 of 2 - PCV13) 03/29/2009  . COLONOSCOPY  02/09/2011  . INFLUENZA VACCINE  09/06/2015   Medicare now request all "baby boomers" test for possible exposure to Hepatitis C. Many may have been exposed due to dental work, tatoo's, vaccinations when young. The Hepatitis C virus is dormant for many years and then sometimes will cause liver cancer. If you gave blood in the past 15 years, you were most likely checked for Hep C. If  you rec'd blood; you may want to consider testing or if you are high risk for any other reason.   Does not take vaccinations; has never been sick with cold  Was educated   A Tetanus is recommended every 10 years. Medicare covers a tetanus if you have a cut or wound; otherwise, there may be a charge. If you had not had a tetanus with pertusses, known as the Tdap, you can take this anytime.  Took it one time; declines today   The Centers for Disease Control are now recommending 2 pneumonia vaccinations after 28. The first is the Prevnar 13. This helps to  boost your immunity to community acquired pneumonia as well as some protection from bacterial pneumonia The 2nd is the pneumovax 23, which offers more broad protection!  Please consider taking these as this is your best protection against pneumonia.  Therefore, Medicare Part B will cover the CologuardTM test once every three years for beneficiaries who meet all of the following criteria:  Age 46 to 86 years,  Asymptomatic (no signs or symptoms of colorectal disease including but not limited to lower gastrointestinal pain, blood in stool, positive guaiac fecal occult blood test or fecal immunochemical test), and  At average risk of developing colorectal cancer (no personal history of adenomatous polyps, colorectal cancer, or inflammatory bowel disease, including Crohn's Disease and ulcerative colitis; no family history of colorectal cancers or adenomatous polyps, familial adenomatous polyposis, or hereditary nonpolyposis colorectal cancer). Will consider this in the future    Keep in mind the flu shot is an inactivated vaccine and takes at least 2 weeks to build immunity. The flu virus can be dormant for 4 days prior to symptoms Taking the flu shot at the beginning of the season can reduce the risk for the entire community.    Advanced Directive  Will try to complete AD; Given copy  Referred to [facility] for questions [facility] offers free advance directive forms, as well as assistance in completing the forms themselves. For assistance, contact the Spiritual Care Department at [phone], or the Clinical Social Work Department at [phone].   Cardiac Risk Factors include: advanced age (>47men, >40 women);dyslipidemia;family history of premature cardiovascular disease;hypertension;male gender;obesity (BMI >30kg/m2)     Objective:    Vitals: BP (!) 146/102   Pulse 88   Ht 5\' 8"  (1.727 m)   Wt 221 lb 3 oz (100.3 kg)   SpO2 98%   BMI 33.63 kg/m   Body mass index is 33.63  kg/m.  Tobacco History  Smoking Status  . Never Smoker  Smokeless Tobacco  . Never Used     Counseling given: Yes   Past Medical History:  Diagnosis Date  . Chronic atrial fibrillation (HCC)    Coumadin therapy  . CVA (cerebral infarction)    Possible history  . Essential hypertension   . Gout   . Overweight(278.02)   . Seasonal allergies   . Secondary cardiomyopathy ([place])    LVEF 40-45% June 2013   No past surgical history on file. Family History  Problem Relation Age of Onset  . Heart disease Mother   . Hypertension Father   . Diabetes Paternal Aunt    History  Sexual Activity  . Sexual activity: Not on file    Outpatient Encounter Prescriptions as of 03/14/2016  Medication Sig  . allopurinol (ZYLOPRIM) 100 MG tablet TAKE ONE TABLET BY MOUTH ONCE DAILY FOR 14 DAYS AND TWO ONCE DAILY FOR 14 DAYS AND THREE ONCE DAILY  .  allopurinol (ZYLOPRIM) 100 MG tablet take 3 tabs by mouth daily  . atenolol (TENORMIN) 100 MG tablet TAKE ONE-HALF TABLET BY MOUTH TWICE DAILY  . diltiazem (CARTIA XT) 180 MG 24 hr capsule Take 1 capsule (180 mg total) by mouth daily.  Marland Kitchen lisinopril (PRINIVIL,ZESTRIL) 10 MG tablet Take 1 tablet (10 mg total) by mouth daily.  Marland Kitchen warfarin (COUMADIN) 2.5 MG tablet Take as directed buy anticoagulation clinic  . [DISCONTINUED] predniSONE (DELTASONE) 10 MG tablet 3 tabs by mouth per day for 3 days,2tabs per day for 3 days,1tab per day for 3 days  . [DISCONTINUED] sulfamethoxazole-trimethoprim (BACTRIM DS,SEPTRA DS) 800-160 MG tablet Take 1 tablet by mouth 2 (two) times daily. (Patient not taking: Reported on 03/14/2016)   No facility-administered encounter medications on file as of 03/14/2016.     Activities of Daily Living In your present state of health, do you have any difficulty performing the following activities: 03/14/2016  Hearing? N  Vision? N  Difficulty concentrating or making decisions? N  Walking or climbing stairs? N  Dressing or bathing? N   Doing errands, shopping? N  Preparing Food and eating ? N  Using the Toilet? N  In the past six months, have you accidently leaked urine? N  Do you have problems with loss of bowel control? N  Managing your Medications? N  Managing your Finances? N  Housekeeping or managing your Housekeeping? N  Some recent data might be hidden    Patient Care Team: [name], MD as PCP - General (Family Medicine)   Assessment:     Exercise Activities and Dietary recommendations Current Exercise Habits: Home exercise routine, Type of exercise: walking  Goals    . Weight (lb) < 200 lb (90.7 kg)          Will try to cut bread and stick to healthy foods (vegetables and fruits )   Food Choices to Lower Your Triglycerides Triglycerides are a type of fat in your blood. High levels of triglycerides can increase the risk of heart disease and stroke. If your triglyceride levels are high, the foods you eat and your eating habits are very important. Choosing the right foods can help lower your triglycerides. What general guidelines do I need to follow?  Lose weight if you are overweight.  Limit or avoid alcohol.  Fill one half of your plate with vegetables and green salads.  Limit fruit to two servings a day. Choose fruit instead of juice.  Make one fourth of your plate whole grains. Look for the word "whole" as the first word in the ingredient list.  Fill one fourth of your plate with lean protein foods.  Enjoy fatty fish (such as salmon, mackerel, sardines, and tuna) three times a week.  Choose healthy fats.  Limit foods high in starch and sugar.  Eat more home-cooked food and less restaurant, buffet, and fast food.  Limit fried foods.  Cook foods using methods other than frying.  Limit saturated fats.  Check ingredient lists to avoid foods with partially hydrogenated oils (trans fats) in them. What foods can I eat? Grains  Whole grains, such as whole wheat or whole grain  breads, crackers, cereals, and pasta. Unsweetened oatmeal, bulgur, barley, quinoa, or brown rice. Corn or whole wheat flour tortillas. Vegetables  Fresh or frozen vegetables (raw, steamed, roasted, or grilled). Green salads. Fruits  All fresh, canned (in natural juice), or frozen fruits. Meat and Other Protein Products  Ground beef (85% or leaner), grass-fed beef,  or beef trimmed of fat. Skinless chicken or [place]. Ground chicken or [place]. Pork trimmed of fat. All fish and seafood. Eggs. Dried beans, peas, or lentils. Unsalted nuts or seeds. Unsalted canned or dry beans. Dairy  Low-fat dairy products, such as skim or 1% milk, 2% or reduced-fat cheeses, low-fat ricotta or cottage cheese, or plain low-fat yogurt. Fats and Oils  Tub margarines without trans fats. Light or reduced-fat mayonnaise and salad dressings. Avocado. Safflower, olive, or canola oils. Natural peanut or almond butter. The items listed above may not be a complete list of recommended foods or beverages. Contact your dietitian for more options.  What foods are not recommended? Grains  White bread. White pasta. White rice. Cornbread. Bagels, pastries, and croissants. Crackers that contain trans fat. Vegetables  White potatoes. Corn. Creamed or fried vegetables. Vegetables in a cheese sauce. Fruits  Dried fruits. Canned fruit in light or heavy syrup. Fruit juice. Meat and Other Protein Products  Fatty cuts of meat. Ribs, chicken wings, bacon, sausage, bologna, salami, chitterlings, fatback, hot dogs, bratwurst, and packaged luncheon meats. Dairy  Whole or 2% milk, cream, half-and-half, and cream cheese. Whole-fat or sweetened yogurt. Full-fat cheeses. Nondairy creamers and whipped toppings. Processed cheese, cheese spreads, or cheese curds. Sweets and Desserts  Corn syrup, sugars, honey, and molasses. Candy. Jam and jelly. Syrup. Sweetened cereals. Cookies, pies, cakes, donuts, muffins, and ice cream. Fats and Oils  Butter,  stick margarine, lard, shortening, ghee, or bacon fat. Coconut, palm kernel, or palm oils. Beverages  Alcohol. Sweetened drinks (such as sodas, lemonade, and fruit drinks or punches). The items listed above may not be a complete list of foods and beverages to avoid. Contact your dietitian for more information.  This information is not intended to replace advice given to you by your health care provider. Make sure you discuss any questions you have with your health care provider. Document Released: 11/10/2003 Document Revised: 06/30/2015 Document Reviewed: 11/26/2012 Elsevier Interactive Patient Education  2017 [place] Risk  03/14/2016 09/30/2015 08/16/2014  Falls in the past year? No No No   Depression Screen PHQ 2/9 Scores 03/14/2016 08/16/2014  PHQ - 2 Score 0 0    Cognitive Function MMSE - Mini Mental State Exam 03/14/2016  Not completed: (No Data)        There is no immunization history for the selected administration types on file for this patient. Screening Tests Health Maintenance  Topic Date Due  . Hepatitis C Screening  1945-09-08  . TETANUS/TDAP  03/30/1963  . ZOSTAVAX  03/29/2004  . PNA vac Low Risk Adult (1 of 2 - PCV13) 03/29/2009  . COLONOSCOPY  02/09/2011  . INFLUENZA VACCINE  09/06/2015      Plan:      PCP Notes  Health Maintenance Declined all vaccinations Will consider the cologuard Will review Advanced directives since his has no children  Will have Hep c at next blood draw   Abnormal Screens  BP elevated 140/100; will check at home over this next week and if elevated will call to schedule sooner; States his BP is lower at home; around 130/70 Apt made with dr. Elease Hashimoto to dicsuss BP and cologuard and fup on labs which have not been drawn since 2016; educated regarding Triglycerides as well   Referrals  Hearing 2000 both ears but no issues   Patient concerns; Wants to lose weight and discussed  Nurse Concerns;BP    Next PCP apt 02/23  with Dr. Elease Hashimoto       During the course of the visit the patient was educated and counseled about the following appropriate screening and preventive services:   Vaccines to include Pneumoccal, Influenza, Hepatitis B, Td, Zostavax, HCV  Electrocardiogram  Cardiovascular Disease  Colorectal cancer screening  Diabetes screening  Prostate Cancer Screening  Glaucoma screening  Nutrition counseling   Smoking cessation counseling  Patient Instructions (the written plan) was given to the patient.    [id number], RN  03/14/2016  Above assessment and plan reviewed. He has scheduled follow up me later this month.  Will address BP then if still up.  Will discuss Cologuard then.  [name] MD [facility]

## 2016-03-14 ENCOUNTER — Ambulatory Visit: Payer: Medicare Other

## 2016-03-14 ENCOUNTER — Ambulatory Visit (INDEPENDENT_AMBULATORY_CARE_PROVIDER_SITE_OTHER): Payer: Medicare Other

## 2016-03-14 VITALS — BP 146/102 | HR 88 | Ht 68.0 in | Wt 221.2 lb

## 2016-03-14 DIAGNOSIS — Z7289 Other problems related to lifestyle: Secondary | ICD-10-CM

## 2016-03-14 DIAGNOSIS — Z0000 Encounter for general adult medical examination without abnormal findings: Secondary | ICD-10-CM

## 2016-03-14 NOTE — Patient Instructions (Addendum)
Jose Bonilla , Thank you for taking time to come for your Medicare Wellness Visit. I appreciate your ongoing commitment to your health goals. Please review the following plan we discussed and let me know if I can assist you in the future.   BP is up some  Minimal Blood Pressure Goal= AVERAGE < 140/90; Ideal is an AVERAGE < 135/85. This AVERAGE should be calculated from @ least 5-7 BP readings taken @ different times of day on different days of week. You should not respond to isolated BP readings , but rather the AVERAGE for that week .Please bring your blood pressure cuff to office visits to verify that it is reliable.It can also be checked against the blood pressure device at the pharmacy. Finger or wrist cuffs are not dependable; an arm cuff is.  Also, monitor sodium intake in label of any canned food, as well as sodium in common everyday condiments; ketchup; salad dressing, sauces and any fast food restaurant.    Will make apt with Dr. Elease Hashimoto   Will review [place] AD Will try to complete AD; Given copy  Referred to [facility] for questions [facility] offers free advance directive forms, as well as assistance in completing the forms themselves. For assistance, contact the Spiritual Care Department at [phone], or the Clinical Social Work Department at [phone].  Will schedule for a vision check    A Tetanus is recommended every 10 years. Medicare covers a tetanus if you have a cut or wound; otherwise, there may be a charge. If you had not had a tetanus with pertusses, known as the Tdap, you can take this anytime.   The Centers for Disease Control are now recommending 2 pneumonia vaccinations after 88. The first is the Prevnar 13. This helps to boost your immunity to community acquired pneumonia as well as some protection from bacterial pneumonia The 2nd is the pneumovax 23, which offers more broad protection!  Please consider taking these as this is your best protection  against pneumonia.  Medicare now request all "baby boomers" test for possible exposure to Hepatitis C. Many may have been exposed due to dental work, tatoo's, vaccinations when young. The Hepatitis C virus is dormant for many years and then sometimes will cause liver cancer. If you gave blood in the past 15 years, you were most likely checked for Hep C. If you rec'd blood; you may want to consider testing or if you are high risk for any other reason.   Therefore, Medicare Part B will cover the CologuardTM test once every three years for beneficiaries who meet all of the following criteria:  Age 37 to 29 years,  Asymptomatic (no signs or symptoms of colorectal disease including but not limited to lower gastrointestinal pain, blood in stool, positive guaiac fecal occult blood test or fecal immunochemical test), and  At average risk of developing colorectal cancer (no personal history of adenomatous polyps, colorectal cancer, or inflammatory bowel disease, including Crohn's Disease and ulcerative colitis; no family history of colorectal cancers or adenomatous polyps, familial adenomatous polyposis, or hereditary nonpolyposis colorectal cancer).    May help to use app, http://vang.com/ Check out  online nutrition programs as GumSearch.nl and http://vang.com/; fit3me; Look for foods with "whole" wheat; bran; oatmeal etc Shot at the farmer's markets in season for fresher choices  Watch for "hydrogenated" on the label of oils which are trans-fats.  Watch for "high fructose corn syrup" in snacks, yogurt or ketchup  Meats have less marbling; bright colored fruits  and vegetables;  Canned; dump out liquid and wash vegetables. Be mindful of what we are eating  Portion control is essential to a health weight! Sit down; take a break and enjoy your meal; take smaller bites; put the fork down between bites;  It takes 20 minutes to get full; so check in with your fullness cues and stop eating when you  start to fill full   Serving Sizes A serving size is a measured amount of food or drink, such as one slice of bread, that has an associated nutrient content. Knowing the serving size of a food or drink can help you determine how much of that food you should consume. What is the size of one serving? The size of one healthy serving depends on the food or drink. To determine a serving size, read the food label. If the food or drink does not have a food label, try to find serving size information online. Or, use the following to estimate the size of one adult serving: Grain  1 slice bread.  bagel.  cup pasta. Vegetable   cup cooked or canned vegetables. 1 cup raw, leafy greens. Fruit   cup canned fruit. 1 medium fruit.  cup dried fruit. Meat and Other Protein Sources  1 oz meat, poultry, or fish.  cup cooked beans. 1 egg.  cup nuts or seeds. 1 Tbsp nut butter.  cup tofu or tempeh. 2 Tbsp hummus. Dairy  An individual container of yogurt (6-8 oz). 1 piece of cheese the size of your thumb (1 oz). 1 cup (8 oz) milk or milk alternative. Fat  A piece the size of one dice. 1 tsp soft margarine. 1 Tbsp mayonnaise. 1 tsp vegetable oil. 1 Tbsp regular salad dressing. 2 Tbsp low-fat salad dressing. How many servings should I eat from each food group each day? The following are the suggested number of servings to try and have every day from each food group. You can also look at your eating throughout the week and aim for meeting these requirements on most days for overall healthy eating. Grain  6-8 servings. Try to have half of your grains from whole grains, such as whole wheat bread, corn tortillas, oatmeal, brown rice, whole wheat pasta, and bulgur. Vegetable  At least 2-3 servings. Fruit  2 servings. Meat and Other Protein Foods  5-6 servings. Aim to have lean proteins, such as chicken, [place], fish, beans, or tofu. Dairy  3 servings. Choose low-fat or nonfat if you are trying to control  your weight. Fat  2-3 servings. Is a serving the same thing as a portion? No. A portion is the actual amount you eat, which may be more than one serving. Knowing the specific serving size of a food and the nutritional information that goes with it can help you make a healthy decision on what size portion to eat. What are some tips to help me learn healthy serving sizes?  Check food labels for serving sizes. Many foods that come as a single portion actually contain multiple servings.  Determine the serving size of foods you commonly eat and figure out how large a portion you usually eat.  Measure the number of servings that can be held by the bowls, glasses, cups, and plates you typically use. For example, pour your breakfast cereal into your regular bowl and then pour it into a measuring cup.  For 1-2 days, measure the serving sizes of all the foods you eat.  Practice estimating serving sizes and  determining how big your portions should be. This information is not intended to replace advice given to you by your health care provider. Make sure you discuss any questions you have with your health care provider. Document Released: 10/21/2002 Document Revised: 09/17/2015 Document Reviewed: 04/21/2013 Elsevier Interactive Patient Education  2017 [employer].       These are the goals we discussed: Goals    . Weight (lb) < 200 lb (90.7 kg)          Will try to cut bread and stick to healthy foods (vegetables and fruits )   Food Choices to Lower Your Triglycerides Triglycerides are a type of fat in your blood. High levels of triglycerides can increase the risk of heart disease and stroke. If your triglyceride levels are high, the foods you eat and your eating habits are very important. Choosing the right foods can help lower your triglycerides. What general guidelines do I need to follow?  Lose weight if you are overweight.  Limit or avoid alcohol.  Fill one half of your plate with  vegetables and green salads.  Limit fruit to two servings a day. Choose fruit instead of juice.  Make one fourth of your plate whole grains. Look for the word "whole" as the first word in the ingredient list.  Fill one fourth of your plate with lean protein foods.  Enjoy fatty fish (such as salmon, mackerel, sardines, and tuna) three times a week.  Choose healthy fats.  Limit foods high in starch and sugar.  Eat more home-cooked food and less restaurant, buffet, and fast food.  Limit fried foods.  Cook foods using methods other than frying.  Limit saturated fats.  Check ingredient lists to avoid foods with partially hydrogenated oils (trans fats) in them. What foods can I eat? Grains  Whole grains, such as whole wheat or whole grain breads, crackers, cereals, and pasta. Unsweetened oatmeal, bulgur, barley, quinoa, or brown rice. Corn or whole wheat flour tortillas. Vegetables  Fresh or frozen vegetables (raw, steamed, roasted, or grilled). Green salads. Fruits  All fresh, canned (in natural juice), or frozen fruits. Meat and Other Protein Products  Ground beef (85% or leaner), grass-fed beef, or beef trimmed of fat. Skinless chicken or [place]. Ground chicken or [place]. Pork trimmed of fat. All fish and seafood. Eggs. Dried beans, peas, or lentils. Unsalted nuts or seeds. Unsalted canned or dry beans. Dairy  Low-fat dairy products, such as skim or 1% milk, 2% or reduced-fat cheeses, low-fat ricotta or cottage cheese, or plain low-fat yogurt. Fats and Oils  Tub margarines without trans fats. Light or reduced-fat mayonnaise and salad dressings. Avocado. Safflower, olive, or canola oils. Natural peanut or almond butter. The items listed above may not be a complete list of recommended foods or beverages. Contact your dietitian for more options.  What foods are not recommended? Grains  White bread. White pasta. White rice. Cornbread. Bagels, pastries, and croissants. Crackers that  contain trans fat. Vegetables  White potatoes. Corn. Creamed or fried vegetables. Vegetables in a cheese sauce. Fruits  Dried fruits. Canned fruit in light or heavy syrup. Fruit juice. Meat and Other Protein Products  Fatty cuts of meat. Ribs, chicken wings, bacon, sausage, bologna, salami, chitterlings, fatback, hot dogs, bratwurst, and packaged luncheon meats. Dairy  Whole or 2% milk, cream, half-and-half, and cream cheese. Whole-fat or sweetened yogurt. Full-fat cheeses. Nondairy creamers and whipped toppings. Processed cheese, cheese spreads, or cheese curds. Sweets and Desserts  Corn syrup, sugars, honey,  and molasses. Candy. Jam and jelly. Syrup. Sweetened cereals. Cookies, pies, cakes, donuts, muffins, and ice cream. Fats and Oils  Butter, stick margarine, lard, shortening, ghee, or bacon fat. Coconut, palm kernel, or palm oils. Beverages  Alcohol. Sweetened drinks (such as sodas, lemonade, and fruit drinks or punches). The items listed above may not be a complete list of foods and beverages to avoid. Contact your dietitian for more information.  This information is not intended to replace advice given to you by your health care provider. Make sure you discuss any questions you have with your health care provider. Document Released: 11/10/2003 Document Revised: 06/30/2015 Document Reviewed: 11/26/2012 Elsevier Interactive Patient Education  2017 [employer].        This is a list of the screening recommended for you and due dates:  Health Maintenance  Topic Date Due  .  Hepatitis C: One time screening is recommended by Center for Disease Control  (CDC) for  adults born from [birth year] through 1966.   [birth year]  . Tetanus Vaccine  03/30/1963  . Shingles Vaccine  03/29/2004  . Pneumonia vaccines (1 of 2 - PCV13) 03/29/2009  . Colon Cancer Screening  02/09/2011  . Flu Shot  09/06/2015        Colonoscopy, Adult A colonoscopy is an exam to look at the entire large intestine.  During the exam, a lubricated, bendable tube is inserted into the anus and then passed into the rectum, colon, and other parts of the large intestine. A colonoscopy is often done as a part of normal colorectal screening or in response to certain symptoms, such as anemia, persistent diarrhea, abdominal pain, and blood in the stool. The exam can help screen for and diagnose medical problems, including:  Tumors.  Polyps.  Inflammation.  Areas of bleeding. Tell a health care provider about:  Any allergies you have.  All medicines you are taking, including vitamins, herbs, eye drops, creams, and over-the-counter medicines.  Any problems you or family members have had with anesthetic medicines.  Any blood disorders you have.  Any surgeries you have had.  Any medical conditions you have.  Any problems you have had passing stool. What are the risks? Generally, this is a safe procedure. However, problems may occur, including:  Bleeding.  A tear in the intestine.  A reaction to medicines given during the exam.  Infection (rare). What happens before the procedure? Eating and drinking restrictions  Follow instructions from your health care provider about eating and drinking, which may include:  A few days before the procedure - follow a low-fiber diet. Avoid nuts, seeds, dried fruit, raw fruits, and vegetables.  1-3 days before the procedure - follow a clear liquid diet. Drink only clear liquids, such as clear broth or bouillon, black coffee or tea, clear juice, clear soft drinks or sports drinks, gelatin desert, and popsicles. Avoid any liquids that contain red or purple dye.  On the day of the procedure - do not eat or drink anything during the 2 hours before the procedure, or within the time period that your health care provider recommends. Bowel prep  If you were prescribed an oral bowel prep to clean out your colon:  Take it as told by your health care provider. Starting the  day before your procedure, you will need to drink a large amount of medicated liquid. The liquid will cause you to have multiple loose stools until your stool is almost clear or light green.  If your skin or anus  gets irritated from diarrhea, you may use these to relieve the irritation:  Medicated wipes, such as adult wet wipes with aloe and vitamin E.  A skin soothing-product like petroleum jelly.  If you vomit while drinking the bowel prep, take a break for up to 60 minutes and then begin the bowel prep again. If vomiting continues and you cannot take the bowel prep without vomiting, call your health care provider. General instructions  Ask your health care provider about changing or stopping your regular medicines. This is especially important if you are taking diabetes medicines or blood thinners.  Plan to have someone take you home from the hospital or clinic. What happens during the procedure?  An IV tube may be inserted into one of your veins.  You will be given medicine to help you relax (sedative).  To reduce your risk of infection:  Your health care team will wash or sanitize their hands.  Your anal area will be washed with soap.  You will be asked to lie on your side with your knees bent.  Your health care provider will lubricate a long, thin, flexible tube. The tube will have a camera and a light on the end.  The tube will be inserted into your anus.  The tube will be gently eased through your rectum and colon.  Air will be delivered into your colon to keep it open. You may feel some pressure or cramping.  The camera will be used to take images during the procedure.  A small tissue sample may be removed from your body to be examined under a microscope (biopsy). If any potential problems are found, the tissue will be sent to a lab for testing.  If small polyps are found, your health care provider may remove them and have them checked for cancer cells.  The tube  that was inserted into your anus will be slowly removed. The procedure may vary among health care providers and hospitals. What happens after the procedure?  Your blood pressure, heart rate, breathing rate, and blood oxygen level will be monitored until the medicines you were given have worn off.  Do not drive for 24 hours after the exam.  You may have a small amount of blood in your stool.  You may pass gas and have mild abdominal cramping or bloating due to the air that was used to inflate your colon during the exam.  It is up to you to get the results of your procedure. Ask your health care provider, or the department performing the procedure, when your results will be ready. This information is not intended to replace advice given to you by your health care provider. Make sure you discuss any questions you have with your health care provider. Document Released: 01/20/2000 Document Revised: 08/12/2015 Document Reviewed: 04/05/2015 Elsevier Interactive Patient Education  2017 [place] Prevention in the Home Introduction Falls can cause injuries. They can happen to people of all ages. There are many things you can do to make your home safe and to help prevent falls. What can I do on the outside of my home?  Regularly fix the edges of walkways and driveways and fix any cracks.  Remove anything that might make you trip as you walk through a door, such as a raised step or threshold.  Trim any bushes or trees on the path to your home.  Use bright outdoor lighting.  Clear any walking paths of anything that might make someone trip,  such as rocks or tools.  Regularly check to see if handrails are loose or broken. Make sure that both sides of any steps have handrails.  Any raised decks and porches should have guardrails on the edges.  Have any leaves, snow, or ice cleared regularly.  Use sand or salt on walking paths during winter.  Clean up any spills in your garage  right away. This includes oil or grease spills. What can I do in the bathroom?  Use night lights.  Install grab bars by the toilet and in the tub and shower. Do not use towel bars as grab bars.  Use non-skid mats or decals in the tub or shower.  If you need to sit down in the shower, use a plastic, non-slip stool.  Keep the floor dry. Clean up any water that spills on the floor as soon as it happens.  Remove soap buildup in the tub or shower regularly.  Attach bath mats securely with double-sided non-slip rug tape.  Do not have throw rugs and other things on the floor that can make you trip. What can I do in the bedroom?  Use night lights.  Make sure that you have a light by your bed that is easy to reach.  Do not use any sheets or blankets that are too big for your bed. They should not hang down onto the floor.  Have a firm chair that has side arms. You can use this for support while you get dressed.  Do not have throw rugs and other things on the floor that can make you trip. What can I do in the kitchen?  Clean up any spills right away.  Avoid walking on wet floors.  Keep items that you use a lot in easy-to-reach places.  If you need to reach something above you, use a strong step stool that has a grab bar.  Keep electrical cords out of the way.  Do not use floor polish or wax that makes floors slippery. If you must use wax, use non-skid floor wax.  Do not have throw rugs and other things on the floor that can make you trip. What can I do with my stairs?  Do not leave any items on the stairs.  Make sure that there are handrails on both sides of the stairs and use them. Fix handrails that are broken or loose. Make sure that handrails are as long as the stairways.  Check any carpeting to make sure that it is firmly attached to the stairs. Fix any carpet that is loose or worn.  Avoid having throw rugs at the top or bottom of the stairs. If you do have throw rugs,  attach them to the floor with carpet tape.  Make sure that you have a light switch at the top of the stairs and the bottom of the stairs. If you do not have them, ask someone to add them for you. What else can I do to help prevent falls?  Wear shoes that:  Do not have high heels.  Have rubber bottoms.  Are comfortable and fit you well.  Are closed at the toe. Do not wear sandals.  If you use a stepladder:  Make sure that it is fully opened. Do not climb a closed stepladder.  Make sure that both sides of the stepladder are locked into place.  Ask someone to hold it for you, if possible.  Clearly mark and make sure that you can see:  Any grab  bars or handrails.  First and last steps.  Where the edge of each step is.  Use tools that help you move around (mobility aids) if they are needed. These include:  Canes.  Walkers.  Scooters.  Crutches.  Turn on the lights when you go into a dark area. Replace any light bulbs as soon as they burn out.  Set up your furniture so you have a clear path. Avoid moving your furniture around.  If any of your floors are uneven, fix them.  If there are any pets around you, be aware of where they are.  Review your medicines with your doctor. Some medicines can make you feel dizzy. This can increase your chance of falling. Ask your doctor what other things that you can do to help prevent falls. This information is not intended to replace advice given to you by your health care provider. Make sure you discuss any questions you have with your health care provider. Document Released: 11/18/2008 Document Revised: 06/30/2015 Document Reviewed: 02/26/2014  2017 Elsevier  Health Maintenance, Male A healthy lifestyle and preventative care can promote health and wellness.  Maintain regular health, dental, and eye exams.  Eat a healthy diet. Foods like vegetables, fruits, whole grains, low-fat dairy products, and lean protein foods contain  the nutrients you need and are low in calories. Decrease your intake of foods high in solid fats, added sugars, and salt. Get information about a proper diet from your health care provider, if necessary.  Regular physical exercise is one of the most important things you can do for your health. Most adults should get at least 150 minutes of moderate-intensity exercise (any activity that increases your heart rate and causes you to sweat) each week. In addition, most adults need muscle-strengthening exercises on 2 or more days a week.   Maintain a healthy weight. The body mass index (BMI) is a screening tool to identify possible weight problems. It provides an estimate of body fat based on height and weight. Your health care provider can find your BMI and can help you achieve or maintain a healthy weight. For males 20 years and older:  A BMI below 18.5 is considered underweight.  A BMI of 18.5 to 24.9 is normal.  A BMI of 25 to 29.9 is considered overweight.  A BMI of 30 and above is considered obese.  Maintain normal blood lipids and cholesterol by exercising and minimizing your intake of saturated fat. Eat a balanced diet with plenty of fruits and vegetables. Blood tests for lipids and cholesterol should begin at age 71 and be repeated every 5 years. If your lipid or cholesterol levels are high, you are over age 100, or you are at high risk for heart disease, you may need your cholesterol levels checked more frequently.Ongoing high lipid and cholesterol levels should be treated with medicines if diet and exercise are not working.  If you smoke, find out from your health care provider how to quit. If you do not use tobacco, do not start.  Lung cancer screening is recommended for adults aged 11-80 years who are at high risk for developing lung cancer because of a history of smoking. A yearly low-dose CT scan of the lungs is recommended for people who have at least a 30-pack-year history of smoking  and are current smokers or have quit within the past 15 years. A pack year of smoking is smoking an average of 1 pack of cigarettes a day for 1 year (for example,  a 30-pack-year history of smoking could mean smoking 1 pack a day for 30 years or 2 packs a day for 15 years). Yearly screening should continue until the smoker has stopped smoking for at least 15 years. Yearly screening should be stopped for people who develop a health problem that would prevent them from having lung cancer treatment.  If you choose to drink alcohol, do not have more than 2 drinks per day. One drink is considered to be 12 oz (360 mL) of beer, 5 oz (150 mL) of wine, or 1.5 oz (45 mL) of liquor.  Avoid the use of street drugs. Do not share needles with anyone. Ask for help if you need support or instructions about stopping the use of drugs.  High blood pressure causes heart disease and increases the risk of stroke. High blood pressure is more likely to develop in:  People who have blood pressure in the end of the normal range (100-139/85-89 mm Hg).  People who are overweight or obese.  People who are African American.  If you are 58-50 years of age, have your blood pressure checked every 3-5 years. If you are 61 years of age or older, have your blood pressure checked every year. You should have your blood pressure measured twice-once when you are at a hospital or clinic, and once when you are not at a hospital or clinic. Record the average of the two measurements. To check your blood pressure when you are not at a hospital or clinic, you can use:  An automated blood pressure machine at a pharmacy.  A home blood pressure monitor.  If you are 15-19 years old, ask your health care provider if you should take aspirin to prevent heart disease.  Diabetes screening involves taking a blood sample to check your fasting blood sugar level. This should be done once every 3 years after age 60 if you are at a normal weight and  without risk factors for diabetes. Testing should be considered at a younger age or be carried out more frequently if you are overweight and have at least 1 risk factor for diabetes.  Colorectal cancer can be detected and often prevented. Most routine colorectal cancer screening begins at the age of 20 and continues through age 31. However, your health care provider may recommend screening at an earlier age if you have risk factors for colon cancer. On a yearly basis, your health care provider may provide home test kits to check for hidden blood in the stool. A small camera at the end of a tube may be used to directly examine the colon (sigmoidoscopy or colonoscopy) to detect the earliest forms of colorectal cancer. Talk to your health care provider about this at age 58 when routine screening begins. A direct exam of the colon should be repeated every 5-10 years through age 32, unless early forms of precancerous polyps or small growths are found.  People who are at an increased risk for hepatitis B should be screened for this virus. You are considered at high risk for hepatitis B if:  You were born in a country where hepatitis B occurs often. Talk with your health care provider about which countries are considered high risk.  Your parents were born in a high-risk country and you have not received a shot to protect against hepatitis B (hepatitis B vaccine).  You have HIV or AIDS.  You use needles to inject street drugs.  You live with, or have sex with, someone who  has hepatitis B.  You are a man who has sex with other men (MSM).  You get hemodialysis treatment.  You take certain medicines for conditions like cancer, organ transplantation, and autoimmune conditions.  Hepatitis C blood testing is recommended for all people born from [birth year] through 1966 and any individual with known risk factors for hepatitis C.  Healthy men should no longer receive prostate-specific antigen (PSA) blood tests as  part of routine cancer screening. Talk to your health care provider about prostate cancer screening.  Testicular cancer screening is not recommended for adolescents or adult males who have no symptoms. Screening includes self-exam, a health care provider exam, and other screening tests. Consult with your health care provider about any symptoms you have or any concerns you have about testicular cancer.  Practice safe sex. Use condoms and avoid high-risk sexual practices to reduce the spread of sexually transmitted infections (STIs).  You should be screened for STIs, including gonorrhea and chlamydia if:  You are sexually active and are younger than 24 years.  You are older than 24 years, and your health care provider tells you that you are at risk for this type of infection.  Your sexual activity has changed since you were last screened, and you are at an increased risk for chlamydia or gonorrhea. Ask your health care provider if you are at risk.  If you are at risk of being infected with HIV, it is recommended that you take a prescription medicine daily to prevent HIV infection. This is called pre-exposure prophylaxis (PrEP). You are considered at risk if:  You are a man who has sex with other men (MSM).  You are a heterosexual man who is sexually active with multiple partners.  You take drugs by injection.  You are sexually active with a partner who has HIV.  Talk with your health care provider about whether you are at high risk of being infected with HIV. If you choose to begin PrEP, you should first be tested for HIV. You should then be tested every 3 months for as long as you are taking PrEP.  Use sunscreen. Apply sunscreen liberally and repeatedly throughout the day. You should seek shade when your shadow is shorter than you. Protect yourself by wearing long sleeves, pants, a wide-brimmed hat, and sunglasses year round whenever you are outdoors.  Tell your health care provider of new  moles or changes in moles, especially if there is a change in shape or color. Also, tell your health care provider if a mole is larger than the size of a pencil eraser.  A one-time screening for abdominal aortic aneurysm (AAA) and surgical repair of large AAAs by ultrasound is recommended for men aged 54-75 years who are current or former smokers.  Stay current with your vaccines (immunizations). This information is not intended to replace advice given to you by your health care provider. Make sure you discuss any questions you have with your health care provider. Document Released: 07/21/2007 Document Revised: 02/12/2014 Document Reviewed: 10/26/2014 Elsevier Interactive Patient Education  2017 [employer].

## 2016-03-30 ENCOUNTER — Ambulatory Visit: Payer: Medicare Other | Admitting: Family Medicine

## 2016-04-29 ENCOUNTER — Other Ambulatory Visit: Payer: Self-pay | Admitting: Family Medicine

## 2016-05-02 ENCOUNTER — Other Ambulatory Visit: Payer: Self-pay | Admitting: Family Medicine

## 2016-05-02 ENCOUNTER — Telehealth: Payer: Self-pay | Admitting: Family Medicine

## 2016-05-02 NOTE — Telephone Encounter (Signed)
I have spoken with this patient and explained to him that I cannot re-fill his coumadin until he has his INR checked.  It is up to him to make an appointment.

## 2016-05-02 NOTE — Telephone Encounter (Signed)
Pt need new Rx for warfarin  Pharm:  [facility] in [place], [place]   Pt is going to be out by this weekend.

## 2016-05-03 NOTE — Telephone Encounter (Signed)
Pt scheduled  

## 2016-05-09 ENCOUNTER — Other Ambulatory Visit: Payer: Self-pay | Admitting: Family Medicine

## 2016-05-09 ENCOUNTER — Ambulatory Visit (INDEPENDENT_AMBULATORY_CARE_PROVIDER_SITE_OTHER): Payer: Medicare Other | Admitting: General Practice

## 2016-05-09 DIAGNOSIS — I4891 Unspecified atrial fibrillation: Secondary | ICD-10-CM

## 2016-05-09 DIAGNOSIS — Z5181 Encounter for therapeutic drug level monitoring: Secondary | ICD-10-CM

## 2016-05-09 LAB — POCT INR: INR: 1.5

## 2016-05-09 NOTE — Patient Instructions (Signed)
Pre visit review using our clinic review tool, if applicable. No additional management support is needed unless otherwise documented below in the visit note. 

## 2016-05-09 NOTE — Telephone Encounter (Signed)
Patient has an appointment today

## 2016-05-25 ENCOUNTER — Encounter: Payer: Self-pay | Admitting: *Deleted

## 2016-05-25 ENCOUNTER — Telehealth: Payer: Self-pay | Admitting: Family Medicine

## 2016-05-25 MED ORDER — ALLOPURINOL 100 MG PO TABS: ORAL_TABLET | ORAL | 0 refills | Status: DC

## 2016-05-25 NOTE — Telephone Encounter (Signed)
Rx sent and a mychart message sent

## 2016-05-25 NOTE — Telephone Encounter (Signed)
Pt need new allopurinol  Pharm:  [facility] in [place]  Pts would like to have a call once ready.

## 2016-06-06 ENCOUNTER — Ambulatory Visit: Payer: Medicare Other

## 2016-06-15 ENCOUNTER — Telehealth: Payer: Self-pay | Admitting: Family Medicine

## 2016-06-15 MED ORDER — DILTIAZEM HCL ER COATED BEADS 180 MG PO CP24: 180.0000 mg | ORAL_CAPSULE | Freq: Every day | ORAL | 0 refills | Status: DC

## 2016-06-15 NOTE — Telephone Encounter (Signed)
Ok to refill 

## 2016-06-15 NOTE — Telephone Encounter (Signed)
Pt is out of town in [place] and needs only 3 pills diltiazem 180 mg. [facility] [place]. Pt said there is [facility] in [place] ,[place]. Pt has an appt on 06-20-16

## 2016-06-15 NOTE — Telephone Encounter (Signed)
Rx sent.  Attempted to call patient but voicemail has not been set up yet.

## 2016-06-20 ENCOUNTER — Encounter: Payer: Self-pay | Admitting: Family Medicine

## 2016-06-20 ENCOUNTER — Ambulatory Visit (INDEPENDENT_AMBULATORY_CARE_PROVIDER_SITE_OTHER): Payer: Medicare Other | Admitting: Family Medicine

## 2016-06-20 ENCOUNTER — Ambulatory Visit (INDEPENDENT_AMBULATORY_CARE_PROVIDER_SITE_OTHER): Payer: Medicare Other | Admitting: General Practice

## 2016-06-20 VITALS — BP 130/90 | HR 83 | Temp 97.7°F | Wt 216.5 lb

## 2016-06-20 DIAGNOSIS — M1A9XX1 Chronic gout, unspecified, with tophus (tophi): Secondary | ICD-10-CM

## 2016-06-20 DIAGNOSIS — I4891 Unspecified atrial fibrillation: Secondary | ICD-10-CM | POA: Diagnosis not present

## 2016-06-20 DIAGNOSIS — I10 Essential (primary) hypertension: Secondary | ICD-10-CM | POA: Diagnosis not present

## 2016-06-20 DIAGNOSIS — Z5181 Encounter for therapeutic drug level monitoring: Secondary | ICD-10-CM | POA: Diagnosis not present

## 2016-06-20 LAB — CBC WITH DIFFERENTIAL/PLATELET
BASOS ABS: 0.1 10*3/uL (ref 0.0–0.1)
Basophils Relative: 1.1 % (ref 0.0–3.0)
EOS PCT: 6.4 % — AB (ref 0.0–5.0)
Eosinophils Absolute: 0.3 10*3/uL (ref 0.0–0.7)
HCT: 43.2 % (ref 39.0–52.0)
HEMOGLOBIN: 14 g/dL (ref 13.0–17.0)
LYMPHS ABS: 1.1 10*3/uL (ref 0.7–4.0)
Lymphocytes Relative: 22.6 % (ref 12.0–46.0)
MCHC: 32.5 g/dL (ref 30.0–36.0)
MCV: 83.2 fl (ref 78.0–100.0)
MONOS PCT: 10.6 % (ref 3.0–12.0)
Monocytes Absolute: 0.5 10*3/uL (ref 0.1–1.0)
NEUTROS PCT: 59.3 % (ref 43.0–77.0)
Neutro Abs: 3 10*3/uL (ref 1.4–7.7)
Platelets: 162 10*3/uL (ref 150.0–400.0)
RBC: 5.19 Mil/uL (ref 4.22–5.81)
RDW: 17.5 % — ABNORMAL HIGH (ref 11.5–15.5)
WBC: 5 10*3/uL (ref 4.0–10.5)

## 2016-06-20 LAB — HEPATIC FUNCTION PANEL
ALK PHOS: 78 U/L (ref 39–117)
ALT: 16 U/L (ref 0–53)
AST: 20 U/L (ref 0–37)
Albumin: 4.2 g/dL (ref 3.5–5.2)
BILIRUBIN TOTAL: 0.9 mg/dL (ref 0.2–1.2)
Bilirubin, Direct: 0.2 mg/dL (ref 0.0–0.3)
Total Protein: 6.5 g/dL (ref 6.0–8.3)

## 2016-06-20 LAB — BASIC METABOLIC PANEL
BUN: 21 mg/dL (ref 6–23)
CALCIUM: 8.9 mg/dL (ref 8.4–10.5)
CO2: 26 mEq/L (ref 19–32)
CREATININE: 1.02 mg/dL (ref 0.40–1.50)
Chloride: 106 mEq/L (ref 96–112)
GFR: 76.26 mL/min (ref >60.00)
GLUCOSE: 87 mg/dL (ref 70–99)
Potassium: 4.2 mEq/L (ref 3.5–5.1)
Sodium: 138 mEq/L (ref 135–145)

## 2016-06-20 LAB — URIC ACID: Uric Acid, Serum: 7.3 mg/dL (ref 4.0–7.8)

## 2016-06-20 LAB — POCT INR: INR: 2.2

## 2016-06-20 MED ORDER — ALLOPURINOL 300 MG PO TABS: 300.0000 mg | ORAL_TABLET | Freq: Every day | ORAL | 3 refills | Status: DC

## 2016-06-20 NOTE — Patient Instructions (Signed)
Pre visit review using our clinic review tool, if applicable. No additional management support is needed unless otherwise documented below in the visit note. 

## 2016-06-20 NOTE — Progress Notes (Signed)
Subjective:     Patient ID: Jose Bonilla, male   DOB: Nov 08, 1945, 71 y.o.   MRN: [id number]  HPI Patient seen for medical follow-up. He has history of obesity, hypertension, atrial fibrillation, gout, remote history of CVA. He is on chronic Coumadin. On several blood pressure medications. He is on allopurinol 300 mg daily has had no acute episodes of gout since starting this. He does have some tophaceous changes left elbow greater than right. No recent chest pains. No dizziness. No bleeding complications. Is due to get his INR repeated today. Compliant with all medications. Denies any side effects.  Past Medical History:  Diagnosis Date  . Chronic atrial fibrillation (HCC)    Coumadin therapy  . CVA (cerebral infarction)    Possible history  . Essential hypertension   . Gout   . Overweight(278.02)   . Seasonal allergies   . 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  reports that he has never smoked. He has never used smokeless tobacco. His alcohol and drug histories are not on file. family history includes Diabetes in his paternal aunt; Heart disease in his mother; Hypertension in his father. Allergies  Allergen Reactions  . Indomethacin Other (See Comments)    Increased heart rate and elevated BP per patient     Review of Systems  Constitutional: Negative for fatigue.  Eyes: Negative for visual disturbance.  Respiratory: Negative for cough, chest tightness and shortness of breath.   Cardiovascular: Negative for chest pain, palpitations and leg swelling.  Neurological: Negative for dizziness, syncope, weakness, light-headedness and headaches.       Objective:   Physical Exam  Constitutional: He is oriented to person, place, and time. He appears well-developed and well-nourished.  HENT:  Right Ear: External ear normal.  Left Ear: External ear normal.  Mouth/Throat: Oropharynx is clear and moist.  Eyes: Pupils are equal,  round, and reactive to light.  Neck: Neck supple. No thyromegaly present.  Cardiovascular: Normal rate and regular rhythm.   Pulmonary/Chest: Effort normal and breath sounds normal. No respiratory distress. He has no wheezes. He has no rales.  Musculoskeletal: He exhibits no edema.  He has bursa inflammation left great than right with tophaceous changes but no erythema or warmth and no tenderness.  Neurological: He is alert and oriented to person, place, and time.       Assessment:     #1 hypertension stable  #2 chronic atrial fibrillation on Coumadin  #3 tophaceous gout    Plan:     -Check labs with basic metabolic panel, hepatic panel, CBC, uric acid level -Refill allopurinol for 300 mg 1 daily -He is encouraged to lose weight -We'll also get INR checked today. Routine follow-up in 6 months  [name] MD [facility] at [facility]

## 2016-07-10 ENCOUNTER — Other Ambulatory Visit: Payer: Self-pay | Admitting: Family Medicine

## 2016-07-13 ENCOUNTER — Other Ambulatory Visit: Payer: Self-pay | Admitting: Family Medicine

## 2016-08-01 ENCOUNTER — Ambulatory Visit (INDEPENDENT_AMBULATORY_CARE_PROVIDER_SITE_OTHER): Payer: Medicare Other | Admitting: General Practice

## 2016-08-01 DIAGNOSIS — I4891 Unspecified atrial fibrillation: Secondary | ICD-10-CM

## 2016-08-01 DIAGNOSIS — Z5181 Encounter for therapeutic drug level monitoring: Secondary | ICD-10-CM | POA: Diagnosis not present

## 2016-08-01 LAB — POCT INR: INR: 1.6

## 2016-08-01 NOTE — Patient Instructions (Signed)
Pre visit review using our clinic review tool, if applicable. No additional management support is needed unless otherwise documented below in the visit note. 

## 2016-08-22 ENCOUNTER — Ambulatory Visit: Payer: Medicare Other

## 2016-08-29 ENCOUNTER — Ambulatory Visit (INDEPENDENT_AMBULATORY_CARE_PROVIDER_SITE_OTHER): Payer: Medicare Other | Admitting: General Practice

## 2016-08-29 DIAGNOSIS — I4891 Unspecified atrial fibrillation: Secondary | ICD-10-CM

## 2016-08-29 DIAGNOSIS — Z5181 Encounter for therapeutic drug level monitoring: Secondary | ICD-10-CM | POA: Diagnosis not present

## 2016-08-29 LAB — POCT INR: INR: 2.4

## 2016-08-29 NOTE — Patient Instructions (Signed)
Pre visit review using our clinic review tool, if applicable. No additional management support is needed unless otherwise documented below in the visit note. 

## 2016-10-01 ENCOUNTER — Other Ambulatory Visit: Payer: Self-pay | Admitting: Family Medicine

## 2016-10-10 ENCOUNTER — Ambulatory Visit: Payer: Medicare Other

## 2016-10-10 ENCOUNTER — Encounter: Payer: Self-pay | Admitting: Family Medicine

## 2016-10-10 ENCOUNTER — Telehealth: Payer: Self-pay | Admitting: Family Medicine

## 2016-10-10 ENCOUNTER — Ambulatory Visit (INDEPENDENT_AMBULATORY_CARE_PROVIDER_SITE_OTHER): Payer: Medicare Other | Admitting: Family Medicine

## 2016-10-10 VITALS — BP 122/80 | HR 66 | Temp 99.6°F | Wt 222.2 lb

## 2016-10-10 DIAGNOSIS — N5089 Other specified disorders of the male genital organs: Secondary | ICD-10-CM | POA: Diagnosis not present

## 2016-10-10 DIAGNOSIS — N509 Disorder of male genital organs, unspecified: Secondary | ICD-10-CM | POA: Diagnosis not present

## 2016-10-10 DIAGNOSIS — R319 Hematuria, unspecified: Secondary | ICD-10-CM

## 2016-10-10 LAB — POCT URINALYSIS DIPSTICK
BILIRUBIN UA: NEGATIVE
GLUCOSE UA: NEGATIVE
KETONES UA: NEGATIVE
Nitrite, UA: POSITIVE
PH UA: 7 (ref 5.0–8.0)
Spec Grav, UA: 1.015 (ref 1.010–1.025)
Urobilinogen, UA: 1 E.U./dL

## 2016-10-10 MED ORDER — LEVOFLOXACIN 500 MG PO TABS: 500.0000 mg | ORAL_TABLET | Freq: Every day | ORAL | 0 refills | Status: DC

## 2016-10-10 NOTE — Telephone Encounter (Signed)
Spoke with patient and an appointment made 

## 2016-10-10 NOTE — Telephone Encounter (Signed)
Jose Bonilla pt would like to have a call back to discuss a personal matter.

## 2016-10-10 NOTE — Patient Instructions (Signed)
We are setting up Urology referral. Start the antibiotic tonight Stay off your feet Follow up immediately for any difficulty urinating.

## 2016-10-10 NOTE — Progress Notes (Signed)
Subjective:     Patient ID: Jose Bonilla, male   DOB: December 22, 1945, 71 y.o.   MRN: [id number]  HPI Patient seen for the following issue. Last Friday he had been working all day and near the end of the day noted a little bit of blood at the urethral meatus when he went to urinate. This seemed to clear after urination. Sometime within the next 24 hours he noted some scrotal swelling without any severe pain. No injury. He takes Coumadin for atrial fibrillation and was concerned he might have had some sort of bleeding complication and he took himself off his Coumadin for 2 days.  He has not noted any more hematuria. Denies any burning with urination. No fevers or chills. No history of kidney stones. Starting sometime last weekend he had progressive scrotal swelling and for the past couple of days had some edema involving the foreskin of penis. Patient takes Coumadin as above and had INR on 08/29/16 of 2.4. No recent fever.  Denies problems with urine flow.  Chronic problems include remote history of CVA, atrial fibrillation, hypertension, gout.  Past Medical History:  Diagnosis Date  . Chronic atrial fibrillation (HCC)    Coumadin therapy  . CVA (cerebral infarction)    Possible history  . Essential hypertension   . Gout   . Overweight(278.02)   . Seasonal allergies   . 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  reports that he has never smoked. He has never used smokeless tobacco. His alcohol and drug histories are not on file. family history includes Diabetes in his paternal aunt; Heart disease in his mother; Hypertension in his father. Allergies  Allergen Reactions  . Indomethacin Other (See Comments)    Increased heart rate and elevated BP per patient       Review of Systems  Constitutional: Negative for chills and fever.  Respiratory: Negative for shortness of breath.   Cardiovascular: Negative for chest pain.  Genitourinary:  Positive for hematuria and scrotal swelling. Negative for decreased urine volume, difficulty urinating, discharge, dysuria, flank pain, frequency and penile pain.       Objective:   Physical Exam  Constitutional: He appears well-developed and well-nourished.  Cardiovascular: Normal rate.   Pulmonary/Chest: Effort normal and breath sounds normal. No respiratory distress. He has no wheezes. He has no rales.  Genitourinary:  Genitourinary Comments: Patient has significant swelling of the foreskin of the penis. He is unable to retract the skin  fully. He has significant edema involving the scrotum and some diffuse redness involving the scrotum. No evidence for any necrosis of the skin. He has firm right testicular mass which is several centimeters diameter but nontender       Assessment:     Patient presents with relatively acute scrotal edema and right testicular mass. He does not have any pain and torsion would seem unlikely. Urine dipstick does reveal small blood along with nitrites and leukocytes but he denies any burning with urination. No history of trauma.    Plan:     -Urine culture sent -Start Levaquin 500 mgs once daily pending culture results -Set up urology referral -he knows to go to ED if any difficulties with urination. -needs follow up INR and will get in to coumadin clinic soon for that.  [name] MD [facility] at [facility]

## 2016-10-11 DIAGNOSIS — N453 Epididymo-orchitis: Secondary | ICD-10-CM | POA: Diagnosis not present

## 2016-10-11 DIAGNOSIS — R319 Hematuria, unspecified: Secondary | ICD-10-CM | POA: Diagnosis not present

## 2016-10-11 DIAGNOSIS — N5089 Other specified disorders of the male genital organs: Secondary | ICD-10-CM | POA: Diagnosis not present

## 2016-10-11 DIAGNOSIS — R310 Gross hematuria: Secondary | ICD-10-CM | POA: Diagnosis not present

## 2016-10-11 NOTE — Addendum Note (Signed)
Addended by: Tomi Likens on: 10/11/2016 07:41 AM   Modules accepted: Orders

## 2016-10-14 LAB — URINE CULTURE
MICRO NUMBER:: 80979636
SPECIMEN QUALITY:: ADEQUATE

## 2016-10-17 ENCOUNTER — Ambulatory Visit (INDEPENDENT_AMBULATORY_CARE_PROVIDER_SITE_OTHER): Payer: Medicare Other | Admitting: General Practice

## 2016-10-17 DIAGNOSIS — Z5181 Encounter for therapeutic drug level monitoring: Secondary | ICD-10-CM

## 2016-10-17 DIAGNOSIS — I4891 Unspecified atrial fibrillation: Secondary | ICD-10-CM | POA: Diagnosis not present

## 2016-10-17 DIAGNOSIS — Z7901 Long term (current) use of anticoagulants: Secondary | ICD-10-CM | POA: Diagnosis not present

## 2016-10-17 LAB — POCT INR: INR: 1.6

## 2016-10-17 NOTE — Patient Instructions (Signed)
Pre visit review using our clinic review tool, if applicable. No additional management support is needed unless otherwise documented below in the visit note. 

## 2016-10-24 ENCOUNTER — Ambulatory Visit: Payer: Medicare Other

## 2016-10-25 ENCOUNTER — Encounter: Payer: Self-pay | Admitting: Family Medicine

## 2016-10-29 ENCOUNTER — Ambulatory Visit: Payer: Medicare Other

## 2016-11-01 DIAGNOSIS — R319 Hematuria, unspecified: Secondary | ICD-10-CM | POA: Diagnosis not present

## 2016-11-01 DIAGNOSIS — R310 Gross hematuria: Secondary | ICD-10-CM | POA: Diagnosis not present

## 2016-11-01 DIAGNOSIS — N200 Calculus of kidney: Secondary | ICD-10-CM | POA: Diagnosis not present

## 2016-11-05 ENCOUNTER — Other Ambulatory Visit: Payer: Self-pay | Admitting: Family Medicine

## 2016-11-05 NOTE — Telephone Encounter (Signed)
Refill OK

## 2016-11-06 ENCOUNTER — Telehealth: Payer: Self-pay | Admitting: Family Medicine

## 2016-11-06 NOTE — Telephone Encounter (Signed)
Pharmacy is call for clarification for medication that was sent in today warfarin they need "Date supply" per pharmacist.

## 2016-11-06 NOTE — Telephone Encounter (Signed)
Rx done. 

## 2016-11-07 MED ORDER — WARFARIN SODIUM 2.5 MG PO TABS: ORAL_TABLET | ORAL | 0 refills | Status: DC

## 2016-11-07 NOTE — Telephone Encounter (Signed)
Routing back to Jose Bonilla, cma---cindy is off all week---since dr burchette sent this med in, do you think he can answer the question for you?  i'm not really sure of anyone that could help from over here----let me know if you need something else--thanks

## 2016-11-07 NOTE — Telephone Encounter (Signed)
New Rx sent to pharmacy

## 2016-11-07 NOTE — Telephone Encounter (Signed)
Pt only has 4 pills left

## 2016-11-07 NOTE — Telephone Encounter (Signed)
Find out what pharmacy needs-  What do they mean by "date supply"??

## 2016-11-08 ENCOUNTER — Other Ambulatory Visit: Payer: Self-pay | Admitting: Family Medicine

## 2016-11-14 ENCOUNTER — Ambulatory Visit: Payer: Medicare Other

## 2016-11-14 ENCOUNTER — Other Ambulatory Visit: Payer: Self-pay | Admitting: Urology

## 2016-11-14 DIAGNOSIS — D3000 Benign neoplasm of unspecified kidney: Secondary | ICD-10-CM

## 2016-11-14 DIAGNOSIS — R310 Gross hematuria: Secondary | ICD-10-CM | POA: Diagnosis not present

## 2016-11-28 ENCOUNTER — Other Ambulatory Visit: Payer: Self-pay | Admitting: General Practice

## 2016-11-28 ENCOUNTER — Ambulatory Visit (INDEPENDENT_AMBULATORY_CARE_PROVIDER_SITE_OTHER): Payer: Medicare Other | Admitting: General Practice

## 2016-11-28 DIAGNOSIS — Z7901 Long term (current) use of anticoagulants: Secondary | ICD-10-CM

## 2016-11-28 DIAGNOSIS — I4891 Unspecified atrial fibrillation: Secondary | ICD-10-CM | POA: Diagnosis not present

## 2016-11-28 LAB — POCT INR: INR: 1.5

## 2016-11-28 MED ORDER — WARFARIN SODIUM 2.5 MG PO TABS: ORAL_TABLET | ORAL | 0 refills | Status: DC

## 2016-11-28 NOTE — Patient Instructions (Signed)
Pre visit review using our clinic review tool, if applicable. No additional management support is needed unless otherwise documented below in the visit note. 

## 2016-12-12 ENCOUNTER — Ambulatory Visit: Payer: Medicare Other

## 2016-12-17 ENCOUNTER — Ambulatory Visit (HOSPITAL_COMMUNITY)
Admission: RE | Admit: 2016-12-17 | Discharge: 2016-12-17 | Disposition: A | Discharge status: Home / Self Care | Payer: Medicare Other | Source: Ambulatory Visit | Attending: Urology | Admitting: Urology

## 2016-12-17 DIAGNOSIS — R319 Hematuria, unspecified: Secondary | ICD-10-CM | POA: Insufficient documentation

## 2016-12-17 DIAGNOSIS — I517 Cardiomegaly: Secondary | ICD-10-CM | POA: Diagnosis not present

## 2016-12-17 DIAGNOSIS — M5136 Other intervertebral disc degeneration, lumbar region: Secondary | ICD-10-CM | POA: Diagnosis not present

## 2016-12-17 DIAGNOSIS — M47816 Spondylosis without myelopathy or radiculopathy, lumbar region: Secondary | ICD-10-CM | POA: Diagnosis not present

## 2016-12-17 DIAGNOSIS — K5730 Diverticulosis of large intestine without perforation or abscess without bleeding: Secondary | ICD-10-CM | POA: Diagnosis not present

## 2016-12-17 DIAGNOSIS — N281 Cyst of kidney, acquired: Secondary | ICD-10-CM | POA: Insufficient documentation

## 2016-12-17 DIAGNOSIS — D3000 Benign neoplasm of unspecified kidney: Secondary | ICD-10-CM | POA: Diagnosis not present

## 2016-12-17 LAB — POCT I-STAT CREATININE: Creatinine, Ser: 1 mg/dL (ref 0.61–1.24)

## 2016-12-17 MED ORDER — GADOBENATE DIMEGLUMINE 529 MG/ML IV SOLN
20.0000 mL | Freq: Once | INTRAVENOUS | Status: AC | PRN
  Administered 2016-12-17: 20 mL via INTRAVENOUS

## 2016-12-19 ENCOUNTER — Ambulatory Visit: Payer: Medicare Other

## 2016-12-28 ENCOUNTER — Emergency Department (HOSPITAL_COMMUNITY)
Admission: EM | Admit: 2016-12-28 | Discharge: 2016-12-29 | Disposition: A | Discharge status: Home / Self Care | Payer: Medicare Other | Attending: Emergency Medicine | Admitting: Emergency Medicine

## 2016-12-28 ENCOUNTER — Encounter (HOSPITAL_COMMUNITY): Payer: Self-pay

## 2016-12-28 ENCOUNTER — Other Ambulatory Visit: Payer: Self-pay

## 2016-12-28 ENCOUNTER — Emergency Department (HOSPITAL_COMMUNITY): Payer: Medicare Other

## 2016-12-28 DIAGNOSIS — R001 Bradycardia, unspecified: Secondary | ICD-10-CM | POA: Insufficient documentation

## 2016-12-28 DIAGNOSIS — Z7901 Long term (current) use of anticoagulants: Secondary | ICD-10-CM | POA: Insufficient documentation

## 2016-12-28 DIAGNOSIS — I4891 Unspecified atrial fibrillation: Secondary | ICD-10-CM | POA: Diagnosis not present

## 2016-12-28 DIAGNOSIS — I499 Cardiac arrhythmia, unspecified: Secondary | ICD-10-CM | POA: Diagnosis present

## 2016-12-28 DIAGNOSIS — I10 Essential (primary) hypertension: Secondary | ICD-10-CM | POA: Insufficient documentation

## 2016-12-28 DIAGNOSIS — I482 Chronic atrial fibrillation, unspecified: Secondary | ICD-10-CM

## 2016-12-28 DIAGNOSIS — Z79899 Other long term (current) drug therapy: Secondary | ICD-10-CM | POA: Insufficient documentation

## 2016-12-28 LAB — CBC
HCT: 43.8 % (ref 39.0–52.0)
HEMOGLOBIN: 13.4 g/dL (ref 13.0–17.0)
MCH: 26.7 pg (ref 26.0–34.0)
MCHC: 30.6 g/dL (ref 30.0–36.0)
MCV: 87.3 fL (ref 78.0–100.0)
Platelets: 149 10*3/uL — ABNORMAL LOW (ref 150–400)
RBC: 5.02 MIL/uL (ref 4.22–5.81)
RDW: 15.5 % (ref 11.5–15.5)
WBC: 6.5 10*3/uL (ref 4.0–10.5)

## 2016-12-28 LAB — PROTIME-INR
INR: 2
PROTHROMBIN TIME: 22.5 s — AB (ref 11.4–15.2)

## 2016-12-28 LAB — BASIC METABOLIC PANEL
ANION GAP: 10 (ref 5–15)
BUN: 20 mg/dL (ref 6–20)
CALCIUM: 8.8 mg/dL — AB (ref 8.9–10.3)
CHLORIDE: 105 mmol/L (ref 101–111)
CO2: 21 mmol/L — AB (ref 22–32)
Creatinine, Ser: 1.35 mg/dL — ABNORMAL HIGH (ref 0.61–1.24)
GFR calc non Af Amer: 51 mL/min — ABNORMAL LOW (ref >60)
GFR, EST AFRICAN AMERICAN: 59 mL/min — AB (ref >60)
Glucose, Bld: 129 mg/dL — ABNORMAL HIGH (ref 65–99)
Potassium: 4.3 mmol/L (ref 3.5–5.1)
Sodium: 136 mmol/L (ref 135–145)

## 2016-12-28 LAB — TROPONIN I

## 2016-12-28 MED ORDER — SODIUM CHLORIDE 0.9 % IV BOLUS (SEPSIS)
1000.0000 mL | Freq: Once | INTRAVENOUS | Status: AC
  Administered 2016-12-28: 1000 mL via INTRAVENOUS

## 2016-12-28 MED ORDER — DILTIAZEM HCL ER COATED BEADS 120 MG PO CP24: 120.0000 mg | ORAL_CAPSULE | Freq: Every day | ORAL | 0 refills | Status: DC

## 2016-12-28 NOTE — ED Provider Notes (Signed)
[facility] EMERGENCY DEPARTMENT Provider Note   CSN: [id number] Arrival date & time: 12/28/16  2128     History   Chief Complaint Chief Complaint  Patient presents with  . Irregular Heart Beat    HPI Jose Bonilla is a 71 y.o. male.  Pt presents to the ED today with lower bp and hr than normal.  The pt has a hx of chronic a.fib for which he takes coumadin.  The pt noticed that he was not feeling quite normal, so he checked his bp and hr.  Both were lower than normal.  He is on both cardizem and atenolol for his bp and a.fib.  He did take his diltiazem tonight (he was unaware that decreased his bp and hr also).  He did hold his atenolol.  The pt has not had any recent dose changes.  He denies any cp or sob.   CHA2DS2/VAS Stroke Risk Points      2 >= 2 Points: High Risk  1 - 1.99 Points: Medium Risk  0 Points: Low Risk    The patient's score has not changed in the past year.:  No Change     Details    This score determines the patient's risk of having a stroke if the  patient has atrial fibrillation.       Points Metrics  0 Has Congestive Heart Failure:  No   0 Has Vascular Disease:  No   1 Has Hypertension:  Yes   1 Age:  87   0 Has Diabetes:  No   0 Had Stroke:  No  Had TIA:  No  Had thromboembolism:  No   0 Male:  No                Past Medical History:  Diagnosis Date  . Chronic atrial fibrillation (HCC)    Coumadin therapy  . CVA (cerebral infarction)    Possible history  . Essential hypertension   . Gout   . Overweight(278.02)   . Seasonal allergies   . 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 Long term (current) use of anticoagulants 10/17/2016  . Acute gout 02/05/2016  . Chronic gout 02/05/2016  . Encounter for therapeutic drug monitoring 03/19/2013  . Atrial fibrillation ([place])   . Ejection fraction < 50%   . Warfarin anticoagulation   . CVA (cerebral infarction)     . Overweight(278.02)   . Edema   . Hypertension 05/10/2011    History reviewed. No pertinent surgical history.     Home Medications    Prior to Admission medications   Medication Sig Start Date End Date Taking? Authorizing Provider  allopurinol (ZYLOPRIM) 300 MG tablet Take 1 tablet (300 mg total) by mouth daily. 06/20/16   Burchette, Alinda Sierras, MD  atenolol (TENORMIN) 100 MG tablet TAKE ONE-HALF TABLET BY MOUTH TWICE DAILY 07/11/16   Burchette, Alinda Sierras, MD  diltiazem (CARDIZEM CD) 120 MG 24 hr capsule Take 1 capsule (120 mg total) by mouth daily. 12/28/16   Isla Pence, MD  levofloxacin (LEVAQUIN) 500 MG tablet Take 1 tablet (500 mg total) by mouth daily. 10/10/16   Burchette, Alinda Sierras, MD  lisinopril (PRINIVIL,ZESTRIL) 10 MG tablet TAKE ONE TABLET BY MOUTH ONCE DAILY 07/11/16   Burchette, Alinda Sierras, MD  warfarin (COUMADIN) 2.5 MG tablet Take one tablet daily 11/28/16   Burchette, Alinda Sierras, MD  Family History Family History  Problem Relation Age of Onset  . Heart disease Mother   . Hypertension Father   . Diabetes Paternal Aunt     Social History Social History   Tobacco Use  . Smoking status: Never Smoker  . Smokeless tobacco: Never Used  Substance Use Topics  . Alcohol use: No    Alcohol/week: 0.0 oz    Frequency: Never    Comment: no use   . Drug use: No     Allergies   Indomethacin   Review of Systems Review of Systems  Cardiovascular: Positive for palpitations.  All other systems reviewed and are negative.    Physical Exam Updated Vital Signs BP 117/65   Pulse 60   Temp 97.9 F (36.6 C) (Oral)   Resp 15   Ht 5\' 8"  (1.727 m)   Wt 102.1 kg (225 lb)   SpO2 95%   BMI 34.21 kg/m   Physical Exam  Constitutional: He is oriented to person, place, and time. He appears well-developed and well-nourished.  HENT:  Head: Normocephalic and atraumatic.  Right Ear: External ear normal.  Left Ear: External ear normal.  Nose: Nose normal.  Mouth/Throat:  Oropharynx is clear and moist.  Eyes: Conjunctivae and EOM are normal. Pupils are equal, round, and reactive to light.  Neck: Normal range of motion. Neck supple.  Cardiovascular: Intact distal pulses. An irregularly irregular rhythm present. Bradycardia present.  Pulmonary/Chest: Effort normal and breath sounds normal.  Abdominal: Soft. Bowel sounds are normal.  Musculoskeletal: Normal range of motion.  Neurological: He is alert and oriented to person, place, and time.  Skin: Skin is warm. Capillary refill takes less than 2 seconds.  Psychiatric: He has a normal mood and affect. His behavior is normal. Judgment and thought content normal.  Nursing note and vitals reviewed.    ED Treatments / Results  Labs (all labs ordered are listed, but only abnormal results are displayed) Labs Reviewed  BASIC METABOLIC PANEL - Abnormal; Notable for the following components:      Result Value   CO2 21 (*)    Glucose, Bld 129 (*)    Creatinine, Ser 1.35 (*)    Calcium 8.8 (*)    GFR calc non Af Amer 51 (*)    GFR calc Af Amer 59 (*)    All other components within normal limits  CBC - Abnormal; Notable for the following components:   Platelets 149 (*)    All other components within normal limits  PROTIME-INR - Abnormal; Notable for the following components:   Prothrombin Time 22.5 (*)    All other components within normal limits  TROPONIN I  I-STAT TROPONIN, ED    EKG  EKG Interpretation  Date/Time:  Friday December 28 2016 21:44:43 EST Ventricular Rate:  60 PR Interval:    QRS Duration: 102 QT Interval:  434 QTC Calculation: 430 R Axis:   46 Text Interpretation:  Atrial fibrillation Confirmed by Isla Pence [phone]) on 12/28/2016 10:58:55 PM       Radiology Dg Chest Port 1 View  Result Date: 12/28/2016 CLINICAL DATA:  Worsening atrial fibrillation since yesterday. Weakness. EXAM: PORTABLE CHEST 1 VIEW COMPARISON:  None. FINDINGS: Shallow inspiration. Cardiac enlargement.  No vascular congestion or edema. No airspace disease or consolidation. No blunting of costophrenic angles. No pneumothorax. Mediastinal contours appear intact. IMPRESSION: Cardiac enlargement.  No evidence of active pulmonary disease. Electronically Signed   By: Lucienne Capers M.D.   On: 12/28/2016 22:15  Procedures Procedures (including critical care time)  Medications Ordered in ED Medications  sodium chloride 0.9 % bolus 1,000 mL (0 mLs Intravenous Stopped 12/28/16 2311)     Initial Impression / Assessment and Plan / ED Course  I have reviewed the triage vital signs and the nursing notes.  Pertinent labs & imaging results that were available during my care of the patient were reviewed by me and considered in my medical decision making (see chart for details).    I will decrease pt's cardizem and hold atenolol.  Pt knows to return if worse and f/u with pcp.  Final Clinical Impressions(s) / ED Diagnoses   Final diagnoses:  Bradycardia  Chronic atrial fibrillation [facility])    ED Discharge Orders        Ordered    diltiazem (CARDIZEM CD) 120 MG 24 hr capsule  Daily     12/28/16 2354       Isla Pence, MD 12/28/16 2359

## 2016-12-28 NOTE — Discharge Instructions (Signed)
Do not take your atenolol in the morning.  Hold it again tomorrow night if HR is still below 50.  Decrease diltiazem to 120 mg daily.

## 2016-12-28 NOTE — ED Triage Notes (Signed)
Pt states he noticed his heart was beating irregularly, has history of afib.  Pt also noted his blood pressure to be lower today.  Pt denies pain or discomfort

## 2017-01-02 DIAGNOSIS — N281 Cyst of kidney, acquired: Secondary | ICD-10-CM | POA: Diagnosis not present

## 2017-01-09 ENCOUNTER — Ambulatory Visit (INDEPENDENT_AMBULATORY_CARE_PROVIDER_SITE_OTHER): Payer: Medicare Other | Admitting: General Practice

## 2017-01-09 DIAGNOSIS — Z7901 Long term (current) use of anticoagulants: Secondary | ICD-10-CM | POA: Diagnosis not present

## 2017-01-09 DIAGNOSIS — I4891 Unspecified atrial fibrillation: Secondary | ICD-10-CM | POA: Diagnosis not present

## 2017-01-09 LAB — POCT INR: INR: 2.1

## 2017-01-09 NOTE — Patient Instructions (Signed)
Pre visit review using our clinic review tool, if applicable. No additional management support is needed unless otherwise documented below in the visit note.  Continue to take 1 tablet daily and 2 tablets on Wednesday.  Re-check in 4 weeks.  

## 2017-02-06 ENCOUNTER — Other Ambulatory Visit: Payer: Self-pay | Admitting: Family Medicine

## 2017-02-13 ENCOUNTER — Ambulatory Visit (INDEPENDENT_AMBULATORY_CARE_PROVIDER_SITE_OTHER): Payer: Medicare Other | Admitting: General Practice

## 2017-02-13 DIAGNOSIS — I4891 Unspecified atrial fibrillation: Secondary | ICD-10-CM | POA: Diagnosis not present

## 2017-02-13 DIAGNOSIS — Z7901 Long term (current) use of anticoagulants: Secondary | ICD-10-CM

## 2017-02-13 LAB — POCT INR: INR: 1.8

## 2017-02-13 NOTE — Patient Instructions (Addendum)
Pre visit review using our clinic review tool, if applicable. No additional management support is needed unless otherwise documented below in the visit note.  Take a total of 3 tablets today (1/9) and then take 1 tablet daily and 2 tablets on Wednesday and Saturdays.  Re-check in 4 weeks.

## 2017-03-13 ENCOUNTER — Ambulatory Visit (INDEPENDENT_AMBULATORY_CARE_PROVIDER_SITE_OTHER): Payer: Medicare Other | Admitting: General Practice

## 2017-03-13 DIAGNOSIS — Z7901 Long term (current) use of anticoagulants: Secondary | ICD-10-CM | POA: Diagnosis not present

## 2017-03-13 DIAGNOSIS — I4891 Unspecified atrial fibrillation: Secondary | ICD-10-CM

## 2017-03-13 LAB — POCT INR: INR: 3.3

## 2017-03-13 NOTE — Patient Instructions (Addendum)
Pre visit review using our clinic review tool, if applicable. No additional management support is needed unless otherwise documented below in the visit note.  Skip coumadin tomorrow (2/7) and then continue to take 1 tablet daily and 2 tablets on Wednesday and Saturdays.  Re-check in 4 weeks.

## 2017-04-10 ENCOUNTER — Ambulatory Visit (INDEPENDENT_AMBULATORY_CARE_PROVIDER_SITE_OTHER): Payer: Medicare Other | Admitting: General Practice

## 2017-04-10 DIAGNOSIS — I4891 Unspecified atrial fibrillation: Secondary | ICD-10-CM

## 2017-04-10 DIAGNOSIS — Z7901 Long term (current) use of anticoagulants: Secondary | ICD-10-CM | POA: Diagnosis not present

## 2017-04-10 LAB — POCT INR: INR: 1.9

## 2017-04-10 NOTE — Patient Instructions (Addendum)
Pre visit review using our clinic review tool, if applicable. No additional management support is needed unless otherwise documented below in the visit note.  Take 3 tablets today (7.5 mg) and then continue to take 1 tablet daily and 2 tablets on Wednesday and Saturdays.  Re-check in 4 weeks.

## 2017-04-17 ENCOUNTER — Other Ambulatory Visit: Payer: Self-pay | Admitting: Family Medicine

## 2017-05-08 ENCOUNTER — Ambulatory Visit: Payer: Medicare Other

## 2017-07-06 ENCOUNTER — Other Ambulatory Visit: Payer: Self-pay | Admitting: Family Medicine

## 2017-07-10 ENCOUNTER — Ambulatory Visit (INDEPENDENT_AMBULATORY_CARE_PROVIDER_SITE_OTHER): Payer: Medicare Other | Admitting: General Practice

## 2017-07-10 DIAGNOSIS — Z7901 Long term (current) use of anticoagulants: Secondary | ICD-10-CM

## 2017-07-10 DIAGNOSIS — I4891 Unspecified atrial fibrillation: Secondary | ICD-10-CM

## 2017-07-10 LAB — POCT INR: INR: 2 (ref 2.0–3.0)

## 2017-07-10 NOTE — Patient Instructions (Addendum)
Pre visit review using our clinic review tool, if applicable. No additional management support is needed unless otherwise documented below in the visit note.  Continue to take 1 tablet daily and 2 tablets on Wednesday and Saturdays.  Re-check in 6 weeks.

## 2017-07-11 ENCOUNTER — Other Ambulatory Visit: Payer: Self-pay | Admitting: General Practice

## 2017-07-11 ENCOUNTER — Other Ambulatory Visit: Payer: Self-pay | Admitting: Family Medicine

## 2017-07-11 MED ORDER — WARFARIN SODIUM 2.5 MG PO TABS: ORAL_TABLET | ORAL | 0 refills | Status: DC

## 2017-07-15 ENCOUNTER — Other Ambulatory Visit: Payer: Self-pay | Admitting: Family Medicine

## 2017-08-21 ENCOUNTER — Ambulatory Visit (INDEPENDENT_AMBULATORY_CARE_PROVIDER_SITE_OTHER): Payer: Medicare Other | Admitting: General Practice

## 2017-08-21 DIAGNOSIS — Z7901 Long term (current) use of anticoagulants: Secondary | ICD-10-CM | POA: Diagnosis not present

## 2017-08-21 DIAGNOSIS — I4891 Unspecified atrial fibrillation: Secondary | ICD-10-CM

## 2017-08-21 LAB — POCT INR: INR: 1.5 — AB (ref 2.0–3.0)

## 2017-08-21 NOTE — Patient Instructions (Addendum)
Pre visit review using our clinic review tool, if applicable. No additional management support is needed unless otherwise documented below in the visit note. Take 3 tablets today (7/17) and take 2 tablets tomorrow (7/18).  On Friday continue to take 1 tablet daily and 2 tablets on Wednesday.  Re-check in 3 weeks.

## 2017-09-11 ENCOUNTER — Ambulatory Visit (INDEPENDENT_AMBULATORY_CARE_PROVIDER_SITE_OTHER): Payer: Medicare Other | Admitting: General Practice

## 2017-09-11 DIAGNOSIS — I4891 Unspecified atrial fibrillation: Secondary | ICD-10-CM

## 2017-09-11 DIAGNOSIS — Z7901 Long term (current) use of anticoagulants: Secondary | ICD-10-CM | POA: Diagnosis not present

## 2017-09-11 LAB — POCT INR: INR: 2.4 (ref 2.0–3.0)

## 2017-09-11 NOTE — Patient Instructions (Addendum)
Pre visit review using our clinic review tool, if applicable. No additional management support is needed unless otherwise documented below in the visit note.  Continue to take 1 tablet daily and 2 tablets on Wednesday.  Re-check in 4 weeks.  

## 2017-09-26 ENCOUNTER — Other Ambulatory Visit: Payer: Self-pay | Admitting: Family Medicine

## 2017-10-09 ENCOUNTER — Ambulatory Visit (INDEPENDENT_AMBULATORY_CARE_PROVIDER_SITE_OTHER): Payer: Medicare Other | Admitting: General Practice

## 2017-10-09 DIAGNOSIS — Z7901 Long term (current) use of anticoagulants: Secondary | ICD-10-CM

## 2017-10-09 DIAGNOSIS — I4891 Unspecified atrial fibrillation: Secondary | ICD-10-CM

## 2017-10-09 LAB — POCT INR: INR: 2.3 (ref 2.0–3.0)

## 2017-10-09 NOTE — Patient Instructions (Addendum)
Pre visit review using our clinic review tool, if applicable. No additional management support is needed unless otherwise documented below in the visit note.  Continue to take 1 tablet daily and 2 tablets on Wednesday.  Re-check in 4 weeks.  

## 2017-11-06 ENCOUNTER — Ambulatory Visit: Payer: Medicare Other

## 2017-11-12 ENCOUNTER — Other Ambulatory Visit: Payer: Self-pay | Admitting: Family Medicine

## 2017-11-15 ENCOUNTER — Ambulatory Visit (INDEPENDENT_AMBULATORY_CARE_PROVIDER_SITE_OTHER): Payer: Medicare Other | Admitting: Family Medicine

## 2017-11-15 ENCOUNTER — Encounter: Payer: Self-pay | Admitting: Family Medicine

## 2017-11-15 ENCOUNTER — Other Ambulatory Visit: Payer: Self-pay

## 2017-11-15 VITALS — BP 138/82 | HR 86 | Temp 97.8°F | Ht 65.75 in | Wt 227.8 lb

## 2017-11-15 DIAGNOSIS — I10 Essential (primary) hypertension: Secondary | ICD-10-CM | POA: Diagnosis not present

## 2017-11-15 DIAGNOSIS — M1A9XX1 Chronic gout, unspecified, with tophus (tophi): Secondary | ICD-10-CM

## 2017-11-15 DIAGNOSIS — M2550 Pain in unspecified joint: Secondary | ICD-10-CM

## 2017-11-15 DIAGNOSIS — Z1159 Encounter for screening for other viral diseases: Secondary | ICD-10-CM

## 2017-11-15 DIAGNOSIS — I4891 Unspecified atrial fibrillation: Secondary | ICD-10-CM | POA: Diagnosis not present

## 2017-11-15 LAB — C-REACTIVE PROTEIN: CRP: 0.3 mg/dL — ABNORMAL LOW (ref 0.5–20.0)

## 2017-11-15 LAB — BASIC METABOLIC PANEL
BUN: 24 mg/dL — AB (ref 6–23)
CHLORIDE: 106 meq/L (ref 96–112)
CO2: 28 meq/L (ref 19–32)
Calcium: 9.2 mg/dL (ref 8.4–10.5)
Creatinine, Ser: 1.13 mg/dL (ref 0.40–1.50)
GFR: 67.49 mL/min (ref >60.00)
Glucose, Bld: 100 mg/dL — ABNORMAL HIGH (ref 70–99)
POTASSIUM: 4.5 meq/L (ref 3.5–5.1)
Sodium: 142 mEq/L (ref 135–145)

## 2017-11-15 MED ORDER — DILTIAZEM HCL ER COATED BEADS 180 MG PO CP24: ORAL_CAPSULE | ORAL | 3 refills | Status: DC

## 2017-11-15 NOTE — Progress Notes (Signed)
  Subjective:     Patient ID: Jose Bonilla, male   DOB: 05-26-1945, 72 y.o.   MRN: [id number]  HPI Patient seen for medical follow-up.  His chronic problems include history of hypertension, atrial fibrillation, remote history of CVA, gout.  He takes allopurinol for gout but has not had any flareups during the past year.  He is on chronic Coumadin and followed through Coumadin clinic.  He had hematuria a year ago and work-up was negative for any clear etiology.  No recent hematuria.  No other bleeding complications.  Medications reviewed.  He needs refills of Cardizem.  Patient also requesting hepatitis C antibody.  Overall low risk.  He has had arthralgias in several joints especially hands and he is specifically requesting CRP screen.  He has not had evidence for objective inflammatory arthritis such as warm joints or any erythema or visible swelling.  He declines flu vaccine or any other preventative vaccines.  Past Medical History:  Diagnosis Date  . Chronic atrial fibrillation    Coumadin therapy  . CVA (cerebral infarction)    Possible history  . Essential hypertension   . Gout   . Overweight(278.02)   . Seasonal allergies   . Secondary cardiomyopathy ([place])    LVEF 40-45% June 2013   History reviewed. No pertinent surgical history.  reports that he has never smoked. He has never used smokeless tobacco. He reports that he does not drink alcohol or use drugs. family history includes Diabetes in his paternal aunt; Heart disease in his mother; Hypertension in his father. Allergies  Allergen Reactions  . Indomethacin Other (See Comments)    Increased heart rate and elevated BP per patient     Review of Systems  Constitutional: Negative for fatigue.  Eyes: Negative for visual disturbance.  Respiratory: Negative for cough, chest tightness and shortness of breath.   Cardiovascular: Negative for chest pain, palpitations and leg swelling.  Musculoskeletal: Positive for arthralgias.   Neurological: Negative for dizziness, syncope, weakness, light-headedness and headaches.       Objective:   Physical Exam  Constitutional: He appears well-developed and well-nourished.  Cardiovascular: Normal rate.  Irregular rhythm.  Pulmonary/Chest: Effort normal and breath sounds normal. He has no wheezes. He has no rales.  Musculoskeletal: He exhibits no edema.  Neurological: He is alert.       Assessment:     #1 hypertension stable and at goal  #2 chronic atrial fibrillation rate controlled and on Coumadin  #3 history of chronic gout stable on allopurinol    Plan:     -Flu vaccine recommended and declined -Check hepatitis C antibody and basic metabolic panel -Refilled Cardizem for 1 year.  Routine follow-up in 6 months and sooner as needed.  He will continue close follow-up with Coumadin clinic  [name] MD [facility] at [facility]

## 2017-11-16 LAB — HEPATITIS C ANTIBODY
Hepatitis C Ab: NONREACTIVE
SIGNAL TO CUT-OFF: 0.02 (ref <1.00)

## 2017-11-18 ENCOUNTER — Ambulatory Visit: Payer: Medicare Other

## 2017-11-20 ENCOUNTER — Encounter: Payer: Self-pay | Admitting: Family Medicine

## 2017-11-25 ENCOUNTER — Ambulatory Visit (INDEPENDENT_AMBULATORY_CARE_PROVIDER_SITE_OTHER): Payer: Medicare Other | Admitting: General Practice

## 2017-11-25 DIAGNOSIS — Z7901 Long term (current) use of anticoagulants: Secondary | ICD-10-CM

## 2017-11-25 DIAGNOSIS — I4891 Unspecified atrial fibrillation: Secondary | ICD-10-CM

## 2017-11-25 LAB — POCT INR: INR: 1.8 — AB (ref 2.0–3.0)

## 2017-11-25 NOTE — Patient Instructions (Signed)
Pre visit review using our clinic review tool, if applicable. No additional management support is needed unless otherwise documented below in the visit note.  Take 2 tablets today and then continue to take 1 tablet daily and 2 tablets on Wednesday.  Re-check in 4 weeks.

## 2017-12-09 ENCOUNTER — Ambulatory Visit (INDEPENDENT_AMBULATORY_CARE_PROVIDER_SITE_OTHER): Payer: Medicare Other | Admitting: Family Medicine

## 2017-12-09 ENCOUNTER — Encounter: Payer: Self-pay | Admitting: Family Medicine

## 2017-12-09 ENCOUNTER — Other Ambulatory Visit: Payer: Self-pay

## 2017-12-09 VITALS — BP 140/90 | HR 119 | Temp 97.5°F | Ht 65.75 in | Wt 230.0 lb

## 2017-12-09 DIAGNOSIS — M79642 Pain in left hand: Secondary | ICD-10-CM

## 2017-12-09 DIAGNOSIS — M79641 Pain in right hand: Secondary | ICD-10-CM

## 2017-12-09 NOTE — Progress Notes (Signed)
  Subjective:     Patient ID: Jose Bonilla, male   DOB: December 06, 1945, 72 y.o.   MRN: [id number]  HPI Patient is seen with bilateral hand pain.  He describes mostly MCP involvement with some PIP involvement.  His job requires frequent use of hands.  He states this has gone on for several months.  Possibly some mild swelling.  He has not noted any warmth or erythema.  He denies any wrist pain.  Occasional ankle pain.  No other significant arthralgias.  No family history of rheumatoid arthritis.  Patient has not noted any rashes or other physical findings.  His current pain is worse with certain types of gripping.  He does take some over-the-counter ibuprofen though we have cautioned him about regular use of nonsteroidals on Coumadin.  He does have history of gout but no recent acute flareups.  On allopurinol regularly.  Past Medical History:  Diagnosis Date  . Chronic atrial fibrillation    Coumadin therapy  . CVA (cerebral infarction)    Possible history  . Essential hypertension   . Gout   . Overweight(278.02)   . Seasonal allergies   . 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 No pertinent surgical history.  reports that he has never smoked. He has never used smokeless tobacco. He reports that he does not drink alcohol or use drugs. family history includes Diabetes in his paternal aunt; Heart disease in his mother; Hypertension in his father. Allergies  Allergen Reactions  . Indomethacin Other (See Comments)    Increased heart rate and elevated BP per patient     Review of Systems  Respiratory: Negative for shortness of breath.   Cardiovascular: Negative for chest pain.  Musculoskeletal: Positive for arthralgias.  Skin: Negative for rash.  Hematological: Negative for adenopathy.       Objective:   Physical Exam  Constitutional: He appears well-developed and well-nourished.  Cardiovascular: Normal rate.  Pulmonary/Chest: Effort normal and  breath sounds normal.  Musculoskeletal: He exhibits no edema.  Hands are examined.  He has no obvious deformities.  No warmth.  No erythema.  No localized tenderness.       Assessment:     Bilateral hand pain predominantly MCP joints.  Although distribution of MCP joints is not as typical for osteoarthritis he does not have any objective evidence for inflammation.  Doubt acute rheumatoid arthritis    Plan:     -Check rheumatoid factor, CCP antibody, sed rate.  If all negative, recommend reassure and observe for now unless any progressive arthritic symptoms  [name] MD [facility] at [facility]

## 2017-12-10 ENCOUNTER — Encounter: Payer: Self-pay | Admitting: Family Medicine

## 2017-12-10 LAB — SEDIMENTATION RATE: SED RATE: 15 mm/h (ref 0–20)

## 2017-12-10 LAB — CYCLIC CITRUL PEPTIDE ANTIBODY, IGG: Cyclic Citrullin Peptide Ab: <16 UNITS

## 2017-12-10 LAB — RHEUMATOID FACTOR: Rhuematoid fact SerPl-aCnc: <14 IU/mL (ref <14)

## 2017-12-23 ENCOUNTER — Ambulatory Visit (INDEPENDENT_AMBULATORY_CARE_PROVIDER_SITE_OTHER): Payer: Medicare Other | Admitting: General Practice

## 2017-12-23 DIAGNOSIS — Z7901 Long term (current) use of anticoagulants: Secondary | ICD-10-CM

## 2017-12-23 DIAGNOSIS — I4891 Unspecified atrial fibrillation: Secondary | ICD-10-CM

## 2017-12-23 LAB — POCT INR: INR: 2.6 (ref 2.0–3.0)

## 2017-12-23 NOTE — Patient Instructions (Signed)
Pre visit review using our clinic review tool, if applicable. No additional management support is needed unless otherwise documented below in the visit note.  Continue to take 1 tablet daily and 2 tablets on Wednesday.  Re-check in 4 weeks.

## 2018-01-06 ENCOUNTER — Other Ambulatory Visit: Payer: Self-pay | Admitting: Family Medicine

## 2018-01-13 ENCOUNTER — Encounter

## 2018-01-13 ENCOUNTER — Other Ambulatory Visit: Payer: Self-pay

## 2018-01-13 ENCOUNTER — Ambulatory Visit (INDEPENDENT_AMBULATORY_CARE_PROVIDER_SITE_OTHER): Payer: Medicare Other | Admitting: Family Medicine

## 2018-01-13 ENCOUNTER — Encounter: Payer: Self-pay | Admitting: Family Medicine

## 2018-01-13 VITALS — BP 142/84 | HR 92 | Temp 97.5°F | Ht 65.75 in | Wt 234.1 lb

## 2018-01-13 DIAGNOSIS — I10 Essential (primary) hypertension: Secondary | ICD-10-CM | POA: Diagnosis not present

## 2018-01-13 DIAGNOSIS — I4891 Unspecified atrial fibrillation: Secondary | ICD-10-CM

## 2018-01-13 DIAGNOSIS — G5602 Carpal tunnel syndrome, left upper limb: Secondary | ICD-10-CM | POA: Diagnosis not present

## 2018-01-13 MED ORDER — LISINOPRIL 20 MG PO TABS: 20.0000 mg | ORAL_TABLET | Freq: Every day | ORAL | 3 refills | Status: DC

## 2018-01-13 NOTE — Progress Notes (Signed)
Subjective:     Patient ID: Jose Bonilla, male   DOB: 18-Oct-1945, 72 y.o.   MRN: [id number]  HPI Patient seen for the following issues  He was seen recently with some MCP joint pain bilaterally.  We obtained inflammatory markers which were all negative.  He presents today with left hand pain which is somewhat different.  He is frequently waking up at night with pain mostly involving his first through third digits.  He also has some intermittent tingling and numbness mostly involving index and middle finger.  No definite weakness.  Right-hand-dominant.  Uses hands a lot with work.  Denies any history of carpal tunnel syndrome.  His pain is in the wrist area and radiates occasionally toward the forearm.  Worse at night.  No neck pain.  No radiculitis symptoms.  Second issue is he states his blood pressures been rising some.  He has gotten up a couple times after midnight around 4 AM and has had blood pressures up around 188-416 systolic.  He takes atenolol 50 mg twice daily, lisinopril 10 mg daily, and diltiazem 180 mg daily.  No recent headaches.  Compliant with medications.  No alcohol use.  Past Medical History:  Diagnosis Date  . Chronic atrial fibrillation    Coumadin therapy  . CVA (cerebral infarction)    Possible history  . Essential hypertension   . Gout   . Overweight(278.02)   . Seasonal allergies   . Secondary cardiomyopathy ([place])    LVEF 40-45% June 2013   History reviewed. No pertinent surgical history.  reports that he has never smoked. He has never used smokeless tobacco. He reports that he does not drink alcohol or use drugs. family history includes Diabetes in his paternal aunt; Heart disease in his mother; Hypertension in his father. Allergies  Allergen Reactions  . Indomethacin Other (See Comments)    Increased heart rate and elevated BP per patient       Review of Systems  Constitutional: Negative for fatigue and unexpected weight change.  Eyes: Negative for  visual disturbance.  Respiratory: Negative for cough, chest tightness and shortness of breath.   Cardiovascular: Negative for chest pain, palpitations and leg swelling.  Gastrointestinal: Negative for abdominal pain.  Neurological: Negative for dizziness, syncope, weakness, light-headedness and headaches.       Objective:   Physical Exam  Constitutional: He is oriented to person, place, and time. He appears well-developed and well-nourished.  HENT:  Right Ear: External ear normal.  Left Ear: External ear normal.  Mouth/Throat: Oropharynx is clear and moist.  Eyes: Pupils are equal, round, and reactive to light.  Neck: Neck supple. No thyromegaly present.  Cardiovascular: Normal rate and regular rhythm.  Pulmonary/Chest: Effort normal and breath sounds normal. No respiratory distress. He has no wheezes. He has no rales.  Musculoskeletal: He exhibits no edema.  For extremities no muscle atrophy.  Tinel sign is negative.  Neurological: He is alert and oriented to person, place, and time.  Full strength upper extremities throughout.  Normal sensory function to touch.  2+ reflexes upper extremities       Assessment:     #1 probable left carpal tunnel syndrome  #2 hypertension poorly controlled by several recent home readings  #3 chronic atrial fibrillation on Coumadin and followed closely by Coumadin clinic    Plan:     -Increase lisinopril to 20 mg daily and bring back to reassess blood pressure within 1 month -Recommend wrist splint left wrist and wear nightly  and touch base at follow-up   If no better at that point consider nerve conduction studies versus referral to hand orthopedic specialist  [name] MD [facility] at [facility]

## 2018-01-13 NOTE — Patient Instructions (Signed)
Carpal Tunnel Syndrome Carpal tunnel syndrome is a condition that causes pain in your hand and arm. The carpal tunnel is a narrow area located on the palm side of your wrist. Repeated wrist motion or certain diseases may cause swelling within the tunnel. This swelling pinches the main nerve in the wrist (median nerve). What are the causes? This condition may be caused by:  Repeated wrist motions.  Wrist injuries.  Arthritis.  A cyst or tumor in the carpal tunnel.  Fluid buildup during pregnancy.  Sometimes the cause of this condition is not known. What increases the risk? This condition is more likely to develop in:  People who have jobs that cause them to repeatedly move their wrists in the same motion, such as butchers and cashiers.  Women.  People with certain conditions, such as: ? Diabetes. ? Obesity. ? An underactive thyroid (hypothyroidism). ? Kidney failure.  What are the signs or symptoms? Symptoms of this condition include:  A tingling feeling in your fingers, especially in your thumb, index, and middle fingers.  Tingling or numbness in your hand.  An aching feeling in your entire arm, especially when your wrist and elbow are bent for long periods of time.  Wrist pain that goes up your arm to your shoulder.  Pain that goes down into your palm or fingers.  A weak feeling in your hands. You may have trouble grabbing and holding items.  Your symptoms may feel worse during the night. How is this diagnosed? This condition is diagnosed with a medical history and physical exam. You may also have tests, including:  An electromyogram (EMG). This test measures electrical signals sent by your nerves into the muscles.  X-rays.  How is this treated? Treatment for this condition includes:  Lifestyle changes. It is important to stop doing or modify the activity that caused your condition.  Physical or occupational therapy.  Medicines for pain and inflammation.  This may include medicine that is injected into your wrist.  A wrist splint.  Surgery.  Follow these instructions at home: If you have a splint:  Wear it as told by your health care provider. Remove it only as told by your health care provider.  Loosen the splint if your fingers become numb and tingle, or if they turn cold and blue.  Keep the splint clean and dry. General instructions  Take over-the-counter and prescription medicines only as told by your health care provider.  Rest your wrist from any activity that may be causing your pain. If your condition is work related, talk to your employer about changes that can be made, such as getting a wrist pad to use while typing.  If directed, apply ice to the painful area: ? Put ice in a plastic bag. ? Place a towel between your skin and the bag. ? Leave the ice on for 20 minutes, 2-3 times per day.  Keep all follow-up visits as told by your health care provider. This is important.  Do any exercises as told by your health care provider, physical therapist, or occupational therapist. Contact a health care provider if:  You have new symptoms.  Your pain is not controlled with medicines.  Your symptoms get worse. This information is not intended to replace advice given to you by your health care provider. Make sure you discuss any questions you have with your health care provider. Document Released: 01/20/2000 Document Revised: 06/02/2015 Document Reviewed: 06/09/2014 Elsevier Interactive Patient Education  2018 Elsevier Inc.  

## 2018-01-22 ENCOUNTER — Ambulatory Visit (INDEPENDENT_AMBULATORY_CARE_PROVIDER_SITE_OTHER): Payer: Medicare Other | Admitting: Cardiology

## 2018-01-22 ENCOUNTER — Encounter

## 2018-01-22 ENCOUNTER — Encounter: Payer: Self-pay | Admitting: Cardiology

## 2018-01-22 VITALS — BP 149/93 | HR 84 | Ht 67.0 in | Wt 235.2 lb

## 2018-01-22 DIAGNOSIS — Z8679 Personal history of other diseases of the circulatory system: Secondary | ICD-10-CM | POA: Diagnosis not present

## 2018-01-22 DIAGNOSIS — I517 Cardiomegaly: Secondary | ICD-10-CM | POA: Diagnosis not present

## 2018-01-22 DIAGNOSIS — I10 Essential (primary) hypertension: Secondary | ICD-10-CM | POA: Diagnosis not present

## 2018-01-22 DIAGNOSIS — I4821 Permanent atrial fibrillation: Secondary | ICD-10-CM | POA: Diagnosis not present

## 2018-01-22 NOTE — Progress Notes (Signed)
Cardiology Office Note  Date: 01/22/2018   ID: Jose Bonilla, DOB [birth year], MRN [id number]  PCP: [name], MD  Primary Cardiologist: Rozann Lesches, MD   Chief Complaint  Patient presents with  . Atrial Fibrillation    History of Present Illness: Jose Bonilla is a 72 y.o. male not seen in the office since November 2016.  He presents overdue for follow-up.  From a cardiac perspective, he does not report any significant palpitations, has had no lightheadedness or syncope.  He continues on medical therapy for heart rate control of atrial fibrillation.  He is on Coumadin with follow-up per PCP.  He denies any significant bleeding episodes.  I personally reviewed his ECG today which shows rate controlled atrial fibrillation.  I reviewed his records.  He did have an MRI of the abdomen back in November 2018 for evaluation of hematuria.  Incidentally noted was cardiomegaly, no other vascular abnormalities.  Chest x-ray at that time described cardiac enlargement but no acute pulmonary process.  He does have a prior history of cardiomyopathy, although normalization of LVEF by echocardiogram in 2016.  Past Medical History:  Diagnosis Date  . Chronic atrial fibrillation    Coumadin therapy  . CVA (cerebral infarction)    Possible history  . Essential hypertension   . Gout   . Overweight(278.02)   . Seasonal allergies   . Secondary cardiomyopathy ([place])    LVEF 40-45% June 2013    History reviewed. No pertinent surgical history.  Current Outpatient Medications  Medication Sig Dispense Refill  . allopurinol (ZYLOPRIM) 300 MG tablet Take 1 tablet (300 mg total) by mouth daily. 90 tablet 3  . atenolol (TENORMIN) 100 MG tablet TAKE 1/2 (ONE-HALF) TABLET BY MOUTH TWICE DAILY 90 tablet 2  . diltiazem (CARTIA XT) 180 MG 24 hr capsule TAKE 1 CAPSULE BY MOUTH ONCE DAILY. 90 capsule 3  . lisinopril (PRINIVIL,ZESTRIL) 20 MG tablet Take 1 tablet (20 mg total) by mouth daily. 90  tablet 3  . warfarin (COUMADIN) 2.5 MG tablet TAKE 1 TABLET BY MOUTH ONCE DAILY OR AS DIRECTED BY ANTICOAGULATION CLINIC 120 tablet 0   No current facility-administered medications for this visit.    Allergies:  Indomethacin   Social History: The patient  reports that he has never smoked. He has never used smokeless tobacco. He reports that he does not drink alcohol or use drugs.   Family History: The patient's family history includes Diabetes in his paternal aunt; Heart disease in his mother; Hypertension in his father.   ROS:  Please see the history of present illness. Otherwise, complete review of systems is positive for none.  All other systems are reviewed and negative.   Physical Exam: VS:  BP (!) 149/93   Pulse 84   Ht 5\' 7"  (1.702 m)   Wt 235 lb 3.2 oz (106.7 kg)   SpO2 98%   BMI 36.84 kg/m , BMI Body mass index is 36.84 kg/m.  Wt Readings from Last 3 Encounters:  01/22/18 235 lb 3.2 oz (106.7 kg)  01/13/18 234 lb 1.6 oz (106.2 kg)  12/09/17 230 lb (104.3 kg)    General: Obese male, appears comfortable at rest. HEENT: Conjunctiva and lids normal, oropharynx clear. Neck: Supple, no elevated JVP or carotid bruits, no thyromegaly. Lungs: Clear to auscultation, nonlabored breathing at rest. Cardiac: Irregularly irregular, no S3, soft systolic murmur. Abdomen: Obese, nontender, bowel sounds present. Extremities: No pitting edema, distal pulses 2+. Skin: Warm and dry. Musculoskeletal: No kyphosis.  Neuropsychiatric: Alert and oriented x3, affect grossly appropriate.  ECG: I personally reviewed the tracing from 12/28/2016 which showed rate controlled atrial fibrillation.  Recent Labwork: 11/15/2017: BUN 24; Creatinine, Ser 1.13; Potassium 4.5; Sodium 142     Component Value Date/Time   CHOL 151 08/16/2014 1517   TRIG 440.0 (H) 08/16/2014 1517   HDL 27.50 (L) 08/16/2014 1517   CHOLHDL 5 08/16/2014 1517   VLDL 45.4 (H) 05/10/2011 1302   LDLDIRECT 67.0 08/16/2014 1517      Other Studies Reviewed Today:  Echocardiogram 09/09/2014: Study Conclusions  - Left ventricle: The cavity size was normal. Wall thickness was   increased increased in a pattern of mild to moderate LVH.   Systolic function was vigorous. The estimated ejection fraction   was in the range of 65% to 70%. The study was not technically   sufficient to allow evaluation of LV diastolic dysfunction due to   atrial fibrillation. - Aortic valve: Mildly calcified annulus. Trileaflet; mildly   thickened leaflets. There was mild to moderate regurgitation.   Valve area (VTI): 3.41 cm^2. Valve area (Vmax): 2.99 cm^2. Valve   area (Vmean): 2.69 cm^2. - Mitral valve: Mildly calcified annulus. Mildly thickened leaflets   . There was mild regurgitation. - Left atrium: The atrium was severely dilated. - Right atrium: The atrium was moderately dilated. - Pulmonary arteries: PA peak pressure: 34 mm Hg (S). PASP is   borderline elevated. - Technically adequate study.  Assessment and Plan:  1.  Permanent atrial fibrillation.  Continue strategy of heart rate control on Tenormin and cardia XT.  He remains on Coumadin with follow-up per PCP.  2.  Previous history of secondary cardiomyopathy with normalization of LVEF as of 2016.  He had evidence of cardiomegaly based on chest x-ray and abdominal MRI in November of last year.  We will follow-up with a repeat echocardiogram.  3.  Essential hypertension, keep follow-up with PCP.  Current medicines were reviewed with the patient today.   Orders Placed This Encounter  Procedures  . EKG 12-Lead  . ECHOCARDIOGRAM COMPLETE    Disposition: Tentative follow-up for 1 year.  Signed, Satira Sark, MD, [facility] 01/22/2018 3:28 PM    [facility] [facility] [place], [place], [place] [address] Phone: [phone]; Fax: [phone]

## 2018-01-22 NOTE — Patient Instructions (Addendum)

## 2018-02-10 ENCOUNTER — Ambulatory Visit (INDEPENDENT_AMBULATORY_CARE_PROVIDER_SITE_OTHER): Payer: Medicare Other | Admitting: General Practice

## 2018-02-10 DIAGNOSIS — I4891 Unspecified atrial fibrillation: Secondary | ICD-10-CM

## 2018-02-10 DIAGNOSIS — Z7901 Long term (current) use of anticoagulants: Secondary | ICD-10-CM

## 2018-02-10 LAB — POCT INR: INR: 1.9 — AB (ref 2.0–3.0)

## 2018-02-10 NOTE — Patient Instructions (Addendum)
Pre visit review using our clinic review tool, if applicable. No additional management support is needed unless otherwise documented below in the visit note.  Take 2 tablets today (1/6) and then continue to take 1 tablet daily and 2 tablets on Wednesday.  Re-check in 4 to 6 weeks.

## 2018-02-11 ENCOUNTER — Ambulatory Visit: Payer: Medicare Other | Admitting: Cardiology

## 2018-02-14 ENCOUNTER — Ambulatory Visit: Payer: Medicare Other | Admitting: Family Medicine

## 2018-02-17 ENCOUNTER — Other Ambulatory Visit: Payer: Self-pay | Admitting: Family Medicine

## 2018-02-17 NOTE — Telephone Encounter (Signed)
For you! Thanks!

## 2018-02-19 ENCOUNTER — Ambulatory Visit: Payer: Medicare Other | Admitting: Family Medicine

## 2018-03-19 DIAGNOSIS — D229 Melanocytic nevi, unspecified: Secondary | ICD-10-CM | POA: Diagnosis not present

## 2018-03-19 DIAGNOSIS — D1801 Hemangioma of skin and subcutaneous tissue: Secondary | ICD-10-CM | POA: Diagnosis not present

## 2018-03-24 ENCOUNTER — Ambulatory Visit (INDEPENDENT_AMBULATORY_CARE_PROVIDER_SITE_OTHER): Payer: Medicare Other | Admitting: General Practice

## 2018-03-24 DIAGNOSIS — Z7901 Long term (current) use of anticoagulants: Secondary | ICD-10-CM | POA: Diagnosis not present

## 2018-03-24 DIAGNOSIS — I4891 Unspecified atrial fibrillation: Secondary | ICD-10-CM

## 2018-03-24 LAB — POCT INR: INR: 2.1 (ref 2.0–3.0)

## 2018-03-24 NOTE — Patient Instructions (Addendum)
Pre visit review using our clinic review tool, if applicable. No additional management support is needed unless otherwise documented below in the visit note.  Take 2 tablets today (1/6) and then continue to take 1 tablet daily and 2 tablets on Wednesday.  Re-check in 6 weeks.

## 2018-03-27 ENCOUNTER — Other Ambulatory Visit: Payer: Medicare Other

## 2018-05-01 ENCOUNTER — Other Ambulatory Visit: Payer: Medicare Other

## 2018-05-05 ENCOUNTER — Ambulatory Visit: Payer: Medicare Other

## 2018-07-16 ENCOUNTER — Other Ambulatory Visit: Payer: Self-pay | Admitting: Family Medicine

## 2018-07-22 ENCOUNTER — Other Ambulatory Visit: Payer: Self-pay | Admitting: Family Medicine

## 2018-07-26 ENCOUNTER — Other Ambulatory Visit: Payer: Self-pay | Admitting: Family Medicine

## 2018-07-26 DIAGNOSIS — Z7901 Long term (current) use of anticoagulants: Secondary | ICD-10-CM

## 2018-07-28 ENCOUNTER — Other Ambulatory Visit: Payer: Self-pay | Admitting: General Practice

## 2018-07-28 ENCOUNTER — Telehealth: Payer: Self-pay | Admitting: Family Medicine

## 2018-07-28 NOTE — Telephone Encounter (Signed)
Not sure who is covering for Jenny Reichmann, can someone make this appointment? Not sure if schedule is correct??

## 2018-07-28 NOTE — Telephone Encounter (Signed)
Jose Bonilla,  I scheduled him at 3:45pm on 08/04/18.  Thank you and I hope you have a good day!!  Tammey Deeg

## 2018-07-28 NOTE — Telephone Encounter (Signed)
Patient needs either his Coumadin level checked or a refill called in the his pharmacy.  [name] is on vacation this week.  Patient only has 2 pills left.  Patient is asking for a return call.

## 2018-07-28 NOTE — Telephone Encounter (Signed)
Thank you so much!!  I appreciate your help with that!

## 2018-07-28 NOTE — Telephone Encounter (Signed)
Hi Megan,  Patient needs an appointment for an INR.  I will be back in the office next Monday so he needs to see me then.  I will call in enough coumadin for a week.  I am working some from home so I will call him and get him scheduled.    Thanks for your help!  Jenny Reichmann

## 2018-08-04 ENCOUNTER — Other Ambulatory Visit: Payer: Self-pay | Admitting: General Practice

## 2018-08-04 ENCOUNTER — Other Ambulatory Visit: Payer: Self-pay

## 2018-08-04 ENCOUNTER — Ambulatory Visit (INDEPENDENT_AMBULATORY_CARE_PROVIDER_SITE_OTHER): Payer: Medicare Other | Admitting: General Practice

## 2018-08-04 DIAGNOSIS — I4891 Unspecified atrial fibrillation: Secondary | ICD-10-CM

## 2018-08-04 DIAGNOSIS — Z7901 Long term (current) use of anticoagulants: Secondary | ICD-10-CM

## 2018-08-04 LAB — POCT INR: INR: 2.2 (ref 2.0–3.0)

## 2018-08-04 MED ORDER — WARFARIN SODIUM 2.5 MG PO TABS: ORAL_TABLET | ORAL | 0 refills | Status: DC

## 2018-08-04 NOTE — Patient Instructions (Signed)
Pre visit review using our clinic review tool, if applicable. No additional management support is needed unless otherwise documented below in the visit note.  Continue to take 1 tablet daily and 2 tablets on Wednesday.  Re-check in 6 weeks.

## 2018-08-18 ENCOUNTER — Other Ambulatory Visit: Payer: Self-pay | Admitting: Family Medicine

## 2018-08-18 DIAGNOSIS — Z7901 Long term (current) use of anticoagulants: Secondary | ICD-10-CM

## 2018-08-19 NOTE — Telephone Encounter (Signed)
Okay for refill? Please advise 

## 2018-09-17 ENCOUNTER — Other Ambulatory Visit: Payer: Self-pay

## 2018-09-17 ENCOUNTER — Ambulatory Visit (INDEPENDENT_AMBULATORY_CARE_PROVIDER_SITE_OTHER): Payer: Medicare Other | Admitting: General Practice

## 2018-09-17 DIAGNOSIS — Z7901 Long term (current) use of anticoagulants: Secondary | ICD-10-CM

## 2018-09-17 DIAGNOSIS — I4891 Unspecified atrial fibrillation: Secondary | ICD-10-CM

## 2018-09-17 LAB — POCT INR: INR: 3 (ref 2.0–3.0)

## 2018-09-17 NOTE — Patient Instructions (Addendum)
Pre visit review using our clinic review tool, if applicable. No additional management support is needed unless otherwise documented below in the visit note.  Continue to take 1 tablet daily and 2 tablets on Wednesday.  Re-check in 6 weeks.

## 2018-10-01 ENCOUNTER — Other Ambulatory Visit: Payer: Self-pay | Admitting: Family Medicine

## 2018-10-01 DIAGNOSIS — Z7901 Long term (current) use of anticoagulants: Secondary | ICD-10-CM

## 2018-10-21 ENCOUNTER — Other Ambulatory Visit: Payer: Self-pay | Admitting: Cardiology

## 2018-10-21 MED ORDER — DILTIAZEM HCL ER COATED BEADS 180 MG PO CP24: ORAL_CAPSULE | ORAL | 0 refills | Status: DC

## 2018-10-21 NOTE — Telephone Encounter (Signed)
Done

## 2018-10-21 NOTE — Telephone Encounter (Signed)
° ° ° °  1. Which medications need to be refilled? (please list name of each medication and dose if known)  diltiazem (CARTIA XT) 180 MG 24    2. Which pharmacy/location (including street and city if local pharmacy) is medication to be sent to? [place], [place], [place]   3. Do they need a 30 day or 90 day supply?   Patient has made a follow up appointment to see Dr. Domenic Polite

## 2018-10-29 ENCOUNTER — Other Ambulatory Visit: Payer: Self-pay

## 2018-10-29 ENCOUNTER — Ambulatory Visit (INDEPENDENT_AMBULATORY_CARE_PROVIDER_SITE_OTHER): Payer: Medicare Other | Admitting: General Practice

## 2018-10-29 DIAGNOSIS — Z7901 Long term (current) use of anticoagulants: Secondary | ICD-10-CM | POA: Diagnosis not present

## 2018-10-29 LAB — POCT INR: INR: 2.9 (ref 2.0–3.0)

## 2018-10-29 NOTE — Patient Instructions (Addendum)
Pre visit review using our clinic review tool, if applicable. No additional management support is needed unless otherwise documented below in the visit note.  Continue to take 1 tablet daily and 2 tablets on Wednesday.  Re-check in 6 weeks. 

## 2018-11-25 ENCOUNTER — Telehealth: Payer: Self-pay | Admitting: Cardiology

## 2018-11-25 NOTE — Telephone Encounter (Signed)
Woke up with symptoms that have him wondering if he should go get COVID tested.  He is schedule for test with [place] Nov 27, 2018

## 2018-11-25 NOTE — Telephone Encounter (Signed)
Reports not feeling well since yesterday. Headache, congestion, cough, sore throat. Denies fever, sob, loss of taste or smell. Wants to know if he needed to cancel his appointment for echo on 11/27/18. Advised that he needed to reschedule. Also advised that he can go for drive through covid testing at [facility]. Verbalized understanding.

## 2018-11-27 ENCOUNTER — Other Ambulatory Visit: Payer: Medicare Other

## 2018-12-10 ENCOUNTER — Ambulatory Visit: Payer: Medicare Other

## 2018-12-15 ENCOUNTER — Ambulatory Visit: Payer: Medicare Other | Admitting: Cardiology

## 2018-12-17 ENCOUNTER — Other Ambulatory Visit: Payer: Self-pay

## 2018-12-17 ENCOUNTER — Ambulatory Visit (INDEPENDENT_AMBULATORY_CARE_PROVIDER_SITE_OTHER): Payer: Medicare Other | Admitting: General Practice

## 2018-12-17 DIAGNOSIS — Z7901 Long term (current) use of anticoagulants: Secondary | ICD-10-CM

## 2018-12-17 DIAGNOSIS — I4891 Unspecified atrial fibrillation: Secondary | ICD-10-CM

## 2018-12-17 LAB — POCT INR: INR: 1.8 — AB (ref 2.0–3.0)

## 2018-12-17 NOTE — Patient Instructions (Addendum)
Pre visit review using our clinic review tool, if applicable. No additional management support is needed unless otherwise documented below in the visit note.  Continue to take 1 tablet daily and 2 tablets on Wednesday.  Re-check in 6 weeks. 

## 2018-12-18 ENCOUNTER — Ambulatory Visit (INDEPENDENT_AMBULATORY_CARE_PROVIDER_SITE_OTHER): Payer: Medicare Other

## 2018-12-18 DIAGNOSIS — I517 Cardiomegaly: Secondary | ICD-10-CM

## 2018-12-19 ENCOUNTER — Telehealth: Payer: Self-pay | Admitting: *Deleted

## 2018-12-19 NOTE — Telephone Encounter (Signed)
Patient informed. Copy sent to PCP °

## 2018-12-19 NOTE — Telephone Encounter (Signed)
-----   Message from Satira Sark, MD sent at 12/18/2018  4:42 PM EST ----- Results reviewed.  Follow-up study shows normal LVEF at 60 to 65%.  We will continue with current follow-up plan.

## 2019-01-15 ENCOUNTER — Telehealth: Payer: Self-pay | Admitting: Cardiology

## 2019-01-15 ENCOUNTER — Ambulatory Visit (INDEPENDENT_AMBULATORY_CARE_PROVIDER_SITE_OTHER): Payer: Medicare Other | Admitting: Cardiology

## 2019-01-15 ENCOUNTER — Encounter: Payer: Self-pay | Admitting: Cardiology

## 2019-01-15 ENCOUNTER — Other Ambulatory Visit: Payer: Self-pay

## 2019-01-15 VITALS — BP 150/92 | HR 92 | Ht 68.0 in | Wt 243.0 lb

## 2019-01-15 DIAGNOSIS — Z8679 Personal history of other diseases of the circulatory system: Secondary | ICD-10-CM

## 2019-01-15 DIAGNOSIS — I340 Nonrheumatic mitral (valve) insufficiency: Secondary | ICD-10-CM

## 2019-01-15 DIAGNOSIS — I4821 Permanent atrial fibrillation: Secondary | ICD-10-CM

## 2019-01-15 NOTE — Telephone Encounter (Signed)
  Precert needed for:  Echo   Location: [facility]   Date: Jan 20, 2020

## 2019-01-15 NOTE — Progress Notes (Signed)
Cardiology Office Note  Date: 01/15/2019   ID: Jose Bonilla, DOB 09-11-1945, MRN [id number]  PCP:  [name], MD  Cardiologist:  Rozann Lesches, MD Electrophysiologist:  None   Chief Complaint  Patient presents with  . Cardiac follow-up    History of Present Illness: Jose Bonilla is a 73 y.o. male last seen in December 2019.  He presents for a routine office visit.  He does not report any exertional chest pain or palpitations, NYHA class II dyspnea with typical activities.  He states that he "rushed" to get here today.  His heart rate was elevated when he came in for ECG, but settled down into the 80s after being seated for a few minutes.  He states that he has been taking his medications regularly.  He continues on Coumadin with follow-up by PCP.  He does not report any spontaneous bleeding problems.  I personally reviewed his ECG today which showed rapid atrial fibrillation at 116 beats per minute with intermittent PVCs versus aberrantly conducted complexes and diffuse nonspecific ST-T wave abnormalities.   Follow-up echocardiogram in November revealed LVEF 60 to 65% with moderate LVH and increased filling pressures, biatrial enlargement, and moderate mitral regurgitation.  We discussed the results today.  He remains on Cartia XT and atenolol for heart rate control.  Past Medical History:  Diagnosis Date  . Chronic atrial fibrillation (HCC)    Coumadin therapy  . CVA (cerebral infarction)    Possible history  . Essential hypertension   . Gout   . Overweight(278.02)   . Seasonal allergies   . Secondary cardiomyopathy ([place])    LVEF 40-45% June 2013    History reviewed. No pertinent surgical history.  Current Outpatient Medications  Medication Sig Dispense Refill  . allopurinol (ZYLOPRIM) 300 MG tablet Take 1 tablet (300 mg total) by mouth daily. 90 tablet 3  . atenolol (TENORMIN) 100 MG tablet Take 1/2 (one-half) tablet by mouth twice daily 90 tablet 0  .  diltiazem (CARTIA XT) 180 MG 24 hr capsule TAKE 1 CAPSULE BY MOUTH ONCE DAILY. 90 capsule 0  . lisinopril (PRINIVIL,ZESTRIL) 20 MG tablet Take 1 tablet (20 mg total) by mouth daily. 90 tablet 3  . warfarin (COUMADIN) 2.5 MG tablet TAKE 1 TABLET BY MOUTH ONCE DAILY EXCEPT  TAKE  2  TABLETS  ON  WEDNESDAY  OR  AS  DIRECTED 105 tablet 0   No current facility-administered medications for this visit.   Allergies:  Indomethacin   Social History: The patient  reports that he has never smoked. He has never used smokeless tobacco. He reports that he does not drink alcohol or use drugs.   ROS:  Please see the history of present illness. Otherwise, complete review of systems is positive for arthritic pain and stiffness in his hands, possible carpal tunnel symptoms on the left..  All other systems are reviewed and negative.   Physical Exam: VS:  BP (!) 150/92   Pulse 92   Ht 5\' 8"  (1.727 m)   Wt 243 lb (110.2 kg)   SpO2 98%   BMI 36.95 kg/m , BMI Body mass index is 36.95 kg/m.  Wt Readings from Last 3 Encounters:  01/15/19 243 lb (110.2 kg)  01/22/18 235 lb 3.2 oz (106.7 kg)  01/13/18 234 lb 1.6 oz (106.2 kg)    General: Patient appears comfortable at rest. HEENT: Conjunctiva and lids normal, wearing a mask. Neck: Supple, no elevated JVP or carotid bruits, no thyromegaly. Lungs: Clear  to auscultation, nonlabored breathing at rest. Cardiac: Irregularly irregular, no S3, soft systolic murmur. Abdomen: Soft, nontender, bowel sounds present. Extremities: No pitting edema, distal pulses 2+. Skin: Warm and dry. Musculoskeletal: No kyphosis. Neuropsychiatric: Alert and oriented x3, affect grossly appropriate.  ECG:  An ECG dated 01/22/2018 was personally reviewed today and demonstrated:  Rate controlled atrial fibrillation.  Recent Labwork:  October 2019: Potassium 4.5, BUN 24, creatinine 1.13, CRP 0.3  Other Studies Reviewed Today:  Echocardiogram 12/18/2018:  1. Left ventricular  ejection fraction, by visual estimation, is 60 to 65%. The left ventricle has normal function. There is moderately increased left ventricular hypertrophy.  2. Elevated left ventricular end-diastolic pressure.  3. Left ventricular diastolic function could not be evaluated.  4. Global right ventricle has low normal systolic function.The right ventricular size is normal. No increase in right ventricular wall thickness.  5. Left atrial size was severely dilated.  6. Right atrial size was moderately dilated.  7. Mild aortic valve annular calcification.  8. The mitral valve is grossly normal. Moderate mitral valve regurgitation.  9. The tricuspid valve is grossly normal. Tricuspid valve regurgitation mild-moderate. 10. The aortic valve is tricuspid. Aortic valve regurgitation is mild. No evidence of aortic valve sclerosis or stenosis. 11. The pulmonic valve was grossly normal. Pulmonic valve regurgitation is not visualized. 12. The inferior vena cava is dilated in size with <50% respiratory variability, suggesting right atrial pressure of 15 mmHg.  Assessment and Plan:  1.  Permanent atrial fibrillation.  Continue current AV nodal blocker regimen for heart rate control and Coumadin for stroke prophylaxis.  PT/INR is followed by PCP.  He does not report any bleeding problems.  2.  History of secondary cardiomyopathy with normalization of LVEF.  Recent follow-up echocardiogram shows LVEF 60 to 65% range.  3.  Moderate mitral regurgitation with severe left atrial enlargement.  Continue to follow, repeat echocardiogram for office visit in 1 year.  Medication Adjustments/Labs and Tests Ordered: Current medicines are reviewed at length with the patient today.  Concerns regarding medicines are outlined above.   Tests Ordered: Orders Placed This Encounter  Procedures  . EKG 12-Lead  . ECHOCARDIOGRAM COMPLETE    Medication Changes: No orders of the defined types were placed in this encounter.    Disposition:  Follow up 1 year in the [place] office.  Signed, Satira Sark, MD, [facility] 01/15/2019 4:32 PM    [facility] [place], [place], [place] [address] Phone: [phone]; Fax: [phone]

## 2019-01-15 NOTE — Patient Instructions (Addendum)
Medication Instructions:    Your physician recommends that you continue on your current medications as directed. Please refer to the Current Medication list given to you today.  Labwork:  NONE  Testing/Procedures: Your physician has requested that you have an echocardiogram in one year just before your next visit. Echocardiography is a painless test that uses sound waves to create images of your heart. It provides your doctor with information about the size and shape of your heart and how well your heart's chambers and valves are working. This procedure takes approximately one hour. There are no restrictions for this procedure.   Follow-Up:  Your physician recommends that you schedule a follow-up appointment in: 1 year (office). You will receive a reminder letter in the mail in about 10 months reminding you to call and schedule your appointment. If you don't receive this letter, please contact our office.  Any Other Special Instructions Will Be Listed Below (If Applicable).  If you need a refill on your cardiac medications before your next appointment, please call your pharmacy.

## 2019-01-16 ENCOUNTER — Other Ambulatory Visit: Payer: Self-pay | Admitting: Cardiology

## 2019-01-16 ENCOUNTER — Other Ambulatory Visit: Payer: Self-pay | Admitting: Family Medicine

## 2019-01-16 DIAGNOSIS — Z7901 Long term (current) use of anticoagulants: Secondary | ICD-10-CM

## 2019-01-19 ENCOUNTER — Ambulatory Visit (INDEPENDENT_AMBULATORY_CARE_PROVIDER_SITE_OTHER): Payer: Medicare Other | Admitting: Family Medicine

## 2019-01-19 ENCOUNTER — Other Ambulatory Visit: Payer: Self-pay

## 2019-01-19 ENCOUNTER — Encounter: Payer: Self-pay | Admitting: Family Medicine

## 2019-01-19 VITALS — BP 146/92 | HR 76 | Temp 97.7°F | Ht 68.0 in | Wt 237.1 lb

## 2019-01-19 DIAGNOSIS — M19042 Primary osteoarthritis, left hand: Secondary | ICD-10-CM | POA: Diagnosis not present

## 2019-01-19 DIAGNOSIS — M19041 Primary osteoarthritis, right hand: Secondary | ICD-10-CM | POA: Diagnosis not present

## 2019-01-19 MED ORDER — DICLOFENAC SODIUM 1 % EX GEL: 2.0000 g | Freq: Four times a day (QID) | CUTANEOUS | 2 refills | Status: DC

## 2019-01-19 NOTE — Progress Notes (Signed)
  Subjective:     Patient ID: Jose Bonilla, male   DOB: 05-26-1945, 73 y.o.   MRN: [id number]  HPI   Jose Bonilla seen with bilateral hand stiffness and joint pains involving mostly DIP and PIP joints of several fingers.  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  He has occasional swelling.  Frequent stiffness.  He notes that when he puts his hands in hot water they feel much better.  Worse with cold/ damp weather.  Couple years ago we obtained inflammatory markers and this did not show any evidence for any kind of inflammatory arthritis.  He takes Coumadin and cannot take oral nonsteroidals safely.  He does have history of gout but does not take allopurinol consistently.  His current clinical presentation is not consistent with gout  Past Medical History:  Diagnosis Date  . Chronic atrial fibrillation (HCC)    Coumadin therapy  . CVA (cerebral infarction)    Possible history  . Essential hypertension   . Gout   . Overweight(278.02)   . Seasonal allergies   . Secondary cardiomyopathy ([place])    LVEF 40-45% June 2013   History reviewed. No pertinent surgical history.  reports that he has never smoked. He has never used smokeless tobacco. He reports that he does not drink alcohol or use drugs. family history includes Diabetes in his paternal aunt; Heart disease in his mother; Hypertension in his father. Allergies  Allergen Reactions  . Indomethacin Other (See Comments)    Increased heart rate and elevated BP per patient     Review of Systems  Respiratory: Negative for shortness of breath.   Cardiovascular: Negative for chest pain.  Musculoskeletal: Positive for arthralgias.  Neurological: Negative for weakness and numbness.  Hematological: Negative for adenopathy.       Objective:   Physical Exam Vitals reviewed.  Constitutional:      Appearance: Normal appearance.  Cardiovascular:     Rate and Rhythm: Normal rate.  Pulmonary:   Effort: Pulmonary effort is normal.     Breath sounds: Normal breath sounds.  Musculoskeletal:     Comments: He has good range of motion in all digits of the hand.  He has several joints that have nodular thickening DIP and PIP joints of several digits of both hands.  No significant MCP joint involvement  Neurological:     Mental Status: He is alert.        Assessment:     Osteoarthritis involving both hands-this fits with his history and clinical presentation.    Plan:     -We discussed the fact that this is a progressive disease with basically only symptomatic treatment -He will consider over-the-counter glucosamine -We discussed possible trial of diclofenac 1% gel and reviewed potential side effects -Avoid oral nonsteroidal medications with his Coumadin therapy  Jose Post MD [facility]

## 2019-01-19 NOTE — Patient Instructions (Signed)
Suspect osteoarthritis of hands  Consider OTC glucosamine and can also supplement with Tylenol

## 2019-01-20 ENCOUNTER — Other Ambulatory Visit: Payer: Self-pay

## 2019-01-21 ENCOUNTER — Ambulatory Visit (INDEPENDENT_AMBULATORY_CARE_PROVIDER_SITE_OTHER): Payer: Medicare Other | Admitting: General Practice

## 2019-01-21 DIAGNOSIS — Z7901 Long term (current) use of anticoagulants: Secondary | ICD-10-CM

## 2019-01-21 LAB — POCT INR: INR: 2.2 (ref 2.0–3.0)

## 2019-01-21 NOTE — Patient Instructions (Signed)
Pre visit review using our clinic review tool, if applicable. No additional management support is needed unless otherwise documented below in the visit note.  Continue to take 1 tablet daily and 2 tablets on Wednesday.  Re-check in 6 weeks.

## 2019-02-01 IMAGING — MR MR ABDOMEN WO/W CM
9 of 18 series · 19 of 48 positions shown · IV contrast (multihance)
Comparison: None.

CLINICAL DATA: Hematuria and recent scrotal swelling.

EXAM:
MRI ABDOMEN WITHOUT AND WITH CONTRAST
TECHNIQUE: Multiplanar multisequence MR imaging of the abdomen was performed
both before and after the administration of intravenous contrast.
CONTRAST:  20mL MULTIHANCE GADOBENATE DIMEGLUMINE 529 MG/ML IV SOLN

[Series 3: T2 · coronal · 4.0mm · 1.24mm/px · 1 of 36 slices shown]
[im 1/36]
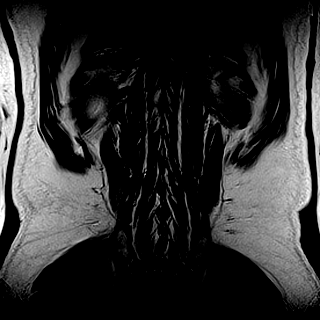

[Series 4: t2fs axial free-breathing · axial · 4.0mm · 0.46mm/px · 1 of 48 slices shown]
[im 1/48]
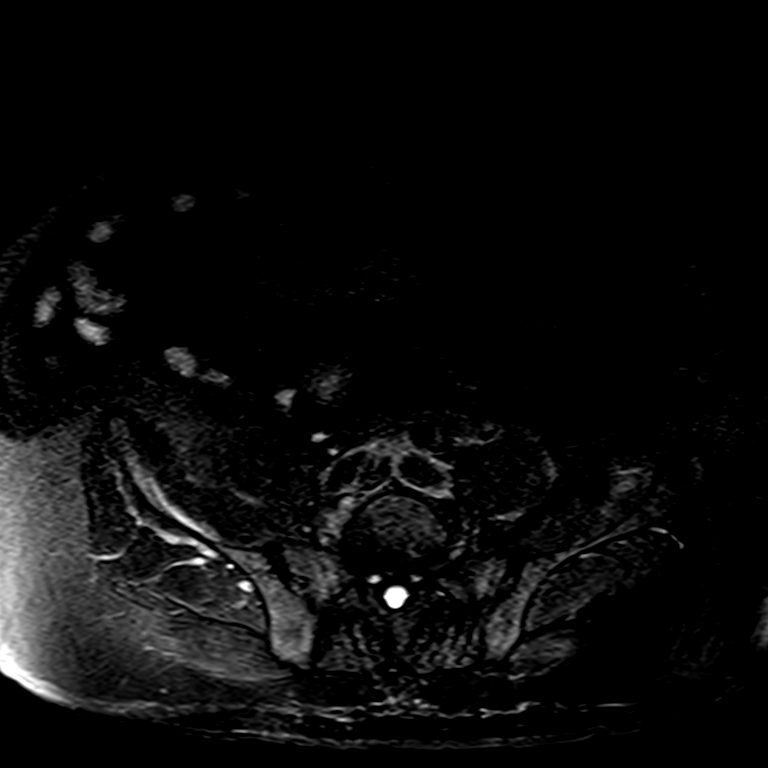

[Series 5: T1 · axial · 4.0mm · 0.59mm/px · z∈[-241,+11]mm · 2 of 64 slices shown (1 of 2)]
[im 1/64]
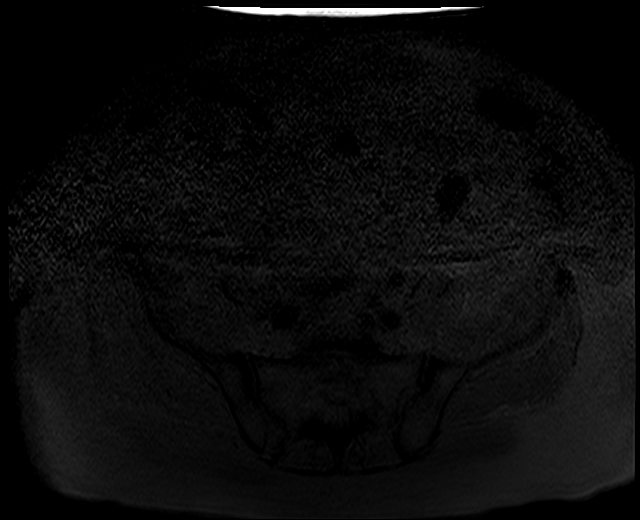
[im 64/64]
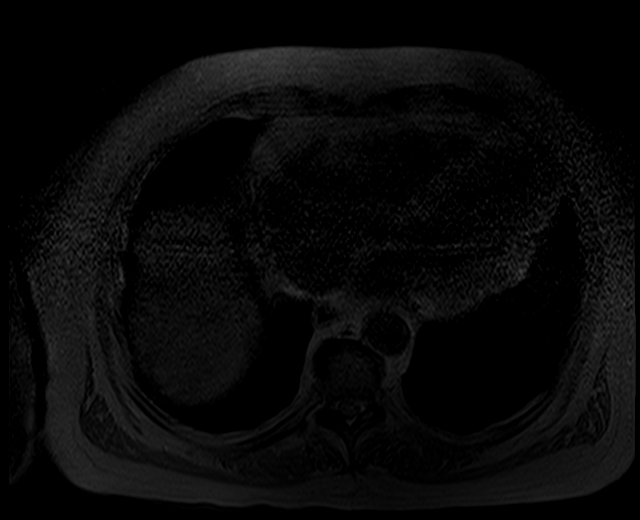

[Series 6: T1 · axial · 4.0mm · 0.59mm/px · z∈[-241,+11]mm · 2 of 64 slices shown (2 of 2)]
[im 1/64]
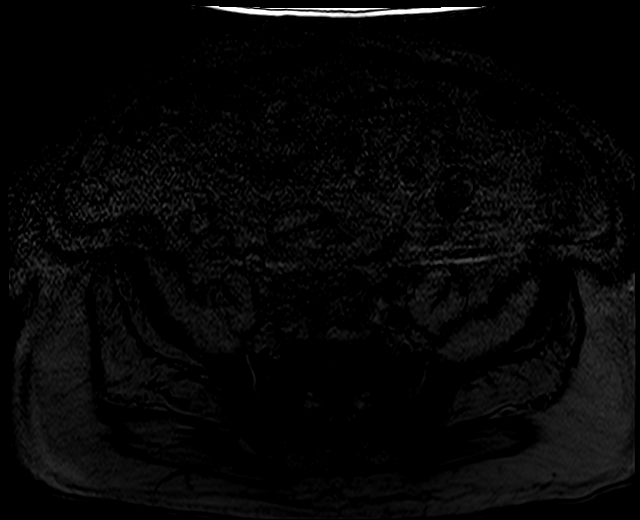
[im 64/64]
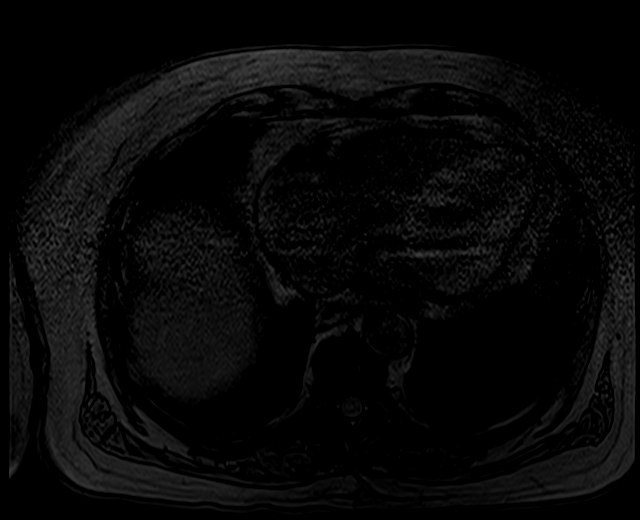

[Series 9: DWI · axial · 5.0mm · 0.99mm/px · z∈[-226,+8]mm · 3 of 80 slices shown (1 of 2)]
[im 1/80]
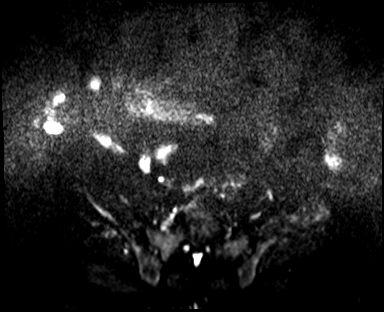
[im 40/80]
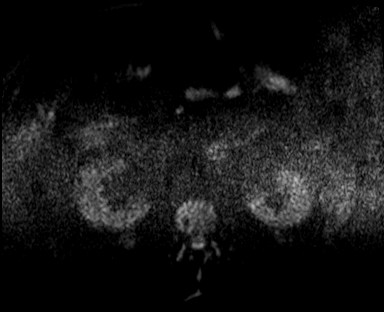
[im 80/80]
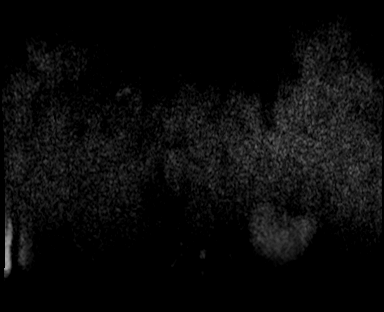

[Series 10: DWI · axial · 5.0mm · 0.99mm/px · z∈[-226,+8]mm · 2 of 40 slices shown (2 of 2)]
[im 1/40]
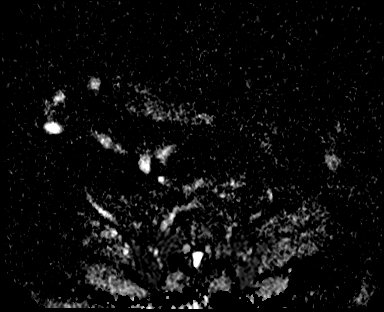
[im 40/40]
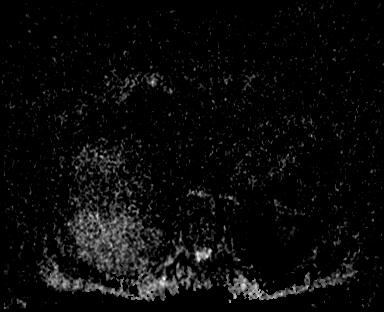

[Series 11: bSSFP · axial · 4.0mm · 0.66mm/px · z∈[-247,+29]mm · 3 of 70 slices shown]
[im 1/70]
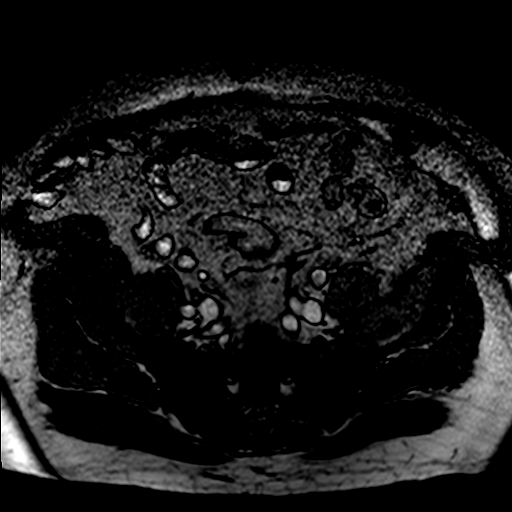
[im 35/70]
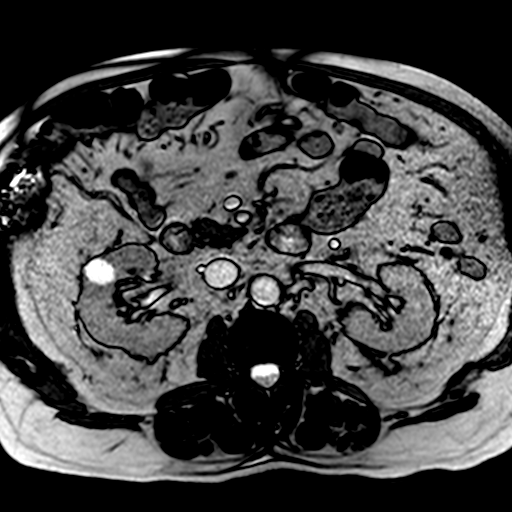
[im 70/70]
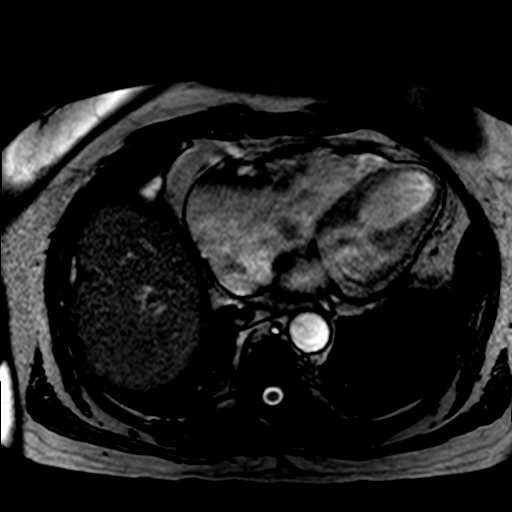

[Series 12: t1fs axial pre · axial · non-contrast · 3.0mm · 0.50mm/px · z∈[-215,-2]mm · 3 of 72 slices shown]
[im 1/72]
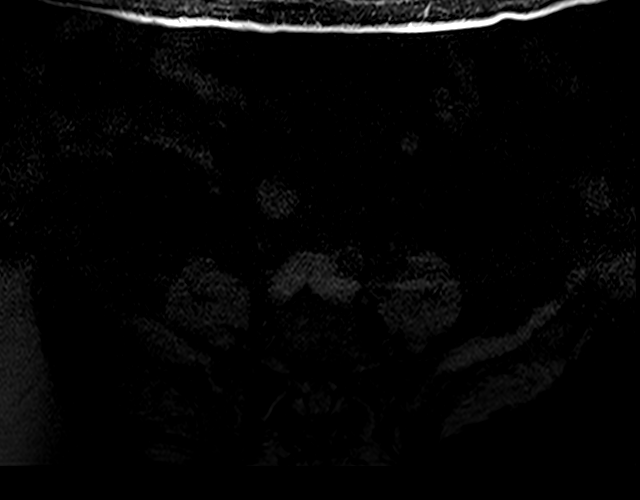
[im 36/72]
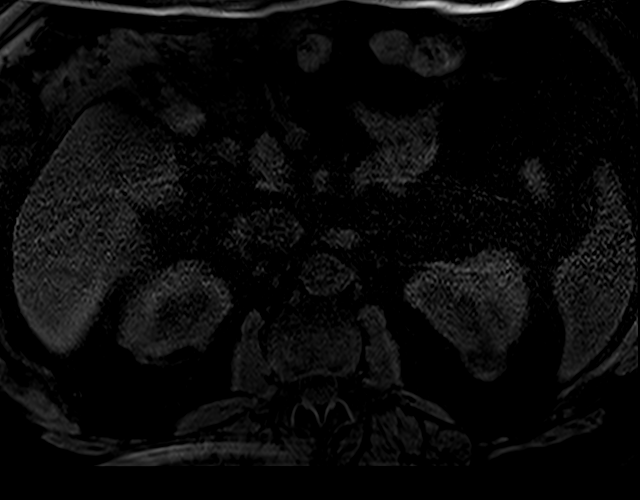
[im 72/72]
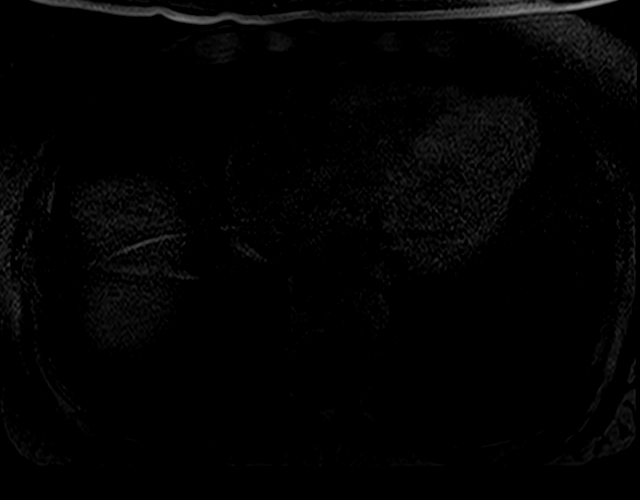

[Series 13: (id) delay · axial · delayed · 3.0mm · 0.50mm/px · z∈[-215,-110]mm · 2 of 72 slices shown]
[im 1/72]
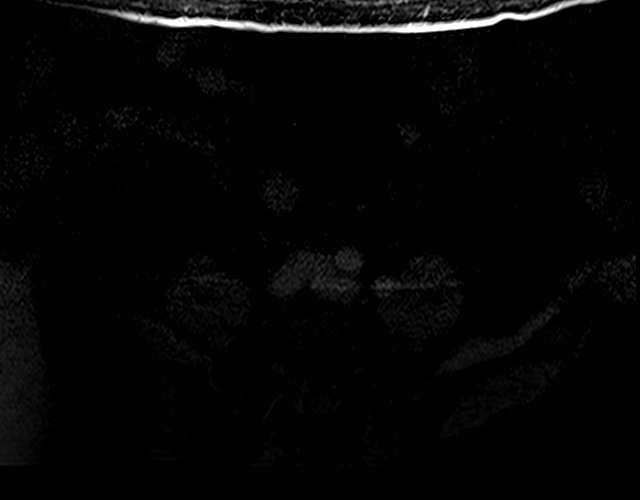
[im 36/72]
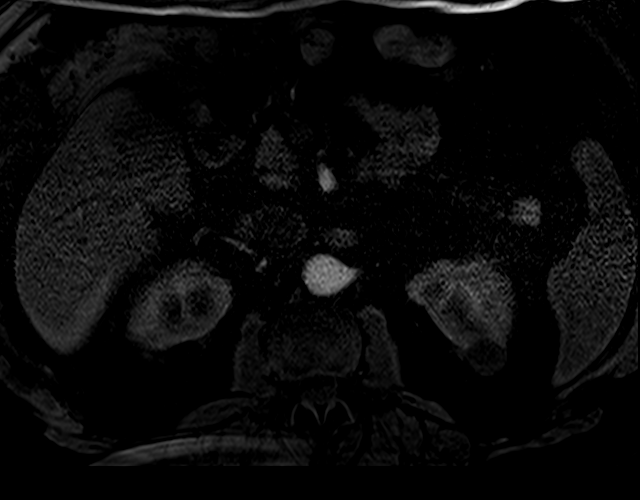

[19 of 48 positions shown; findings below may reference images not displayed]

FINDINGS: Despite efforts by the technologist and patient, motion artifact is
present on today's exam and could not be eliminated. This reduces
exam sensitivity and specificity.

Lower chest: Mild cardiomegaly.

Hepatobiliary: Unremarkable

Pancreas:  Unremarkable

Spleen:  Unremarkable

Adrenals/Urinary Tract:  Adrenal glands normal.

2.3 by 2.2 cm Bosniak category 1 cyst of the right kidney upper
pole, image 41/15.

2.1 by 1.5 cm Bosniak category 1 cysts of the right mid kidney
anteriorly on image 48/15.

2.2 by 2.1 cm Bosniak category 2 cyst of the left kidney upper pole
posteriorly, with a dependent rim of higher T1 signal but without
appreciable enhancement.

No other significant renal lesions are identified.

Stomach/Bowel: Descending and sigmoid colon diverticulosis.

Vascular/Lymphatic:  Unremarkable

Other:  No supplemental non-categorized findings.

Musculoskeletal: Thoracic and lumbar spondylosis and its lumbar
degenerative disc disease.
IMPRESSION: 1. A cause for hematuria is not identified. There are several benign
renal cysts as noted above. The left kidney upper pole lesion has
mild complexity but no appreciable enhancement.
2. Mild cardiomegaly.
3. Descending and sigmoid colon diverticulosis.
4. Lumbar spondylosis and degenerative disc disease.

## 2019-03-04 ENCOUNTER — Ambulatory Visit (INDEPENDENT_AMBULATORY_CARE_PROVIDER_SITE_OTHER): Payer: Medicare Other | Admitting: General Practice

## 2019-03-04 ENCOUNTER — Other Ambulatory Visit: Payer: Self-pay

## 2019-03-04 DIAGNOSIS — Z7901 Long term (current) use of anticoagulants: Secondary | ICD-10-CM

## 2019-03-04 LAB — POCT INR: INR: 1.9 — AB (ref 2.0–3.0)

## 2019-03-04 NOTE — Patient Instructions (Addendum)
Pre visit review using our clinic review tool, if applicable. No additional management support is needed unless otherwise documented below in the visit note.  Change dosage and take 1 tablet daily except take 2 tablets on Wed and Saturdays.  Re-check in 4 weeks.

## 2019-03-18 ENCOUNTER — Other Ambulatory Visit: Payer: Self-pay | Admitting: Family Medicine

## 2019-03-21 ENCOUNTER — Ambulatory Visit: Payer: Medicare Other | Attending: Internal Medicine

## 2019-03-21 DIAGNOSIS — Z23 Encounter for immunization: Secondary | ICD-10-CM

## 2019-03-21 NOTE — Progress Notes (Signed)
   Covid-19 Vaccination Clinic  Name:  Reston Hocker    MRN: [id number] DOB: [birth year]  03/21/2019  Mr. Kinkade was observed post Covid-19 immunization for 15 minutes without incidence. He was provided with Vaccine Information Sheet and instruction to access the V-Safe system.   Mr. Tise was instructed to call 911 with any severe reactions post vaccine: Marland Kitchen Difficulty breathing  . Swelling of your face and throat  . A fast heartbeat  . A bad rash all over your body  . Dizziness and weakness    Immunizations Administered    Name Date Dose VIS Date Route   Moderna COVID-19 Vaccine 03/21/2019  2:12 PM 0.5 mL 01/06/2019 Intramuscular   Manufacturer: Moderna   Lot: [id number]   [id number]

## 2019-03-22 ENCOUNTER — Other Ambulatory Visit: Payer: Self-pay | Admitting: Family Medicine

## 2019-03-31 ENCOUNTER — Other Ambulatory Visit: Payer: Self-pay

## 2019-04-01 ENCOUNTER — Ambulatory Visit (INDEPENDENT_AMBULATORY_CARE_PROVIDER_SITE_OTHER): Payer: Medicare Other | Admitting: General Practice

## 2019-04-01 DIAGNOSIS — Z7901 Long term (current) use of anticoagulants: Secondary | ICD-10-CM | POA: Diagnosis not present

## 2019-04-01 LAB — POCT INR: INR: 3 (ref 2.0–3.0)

## 2019-04-01 NOTE — Patient Instructions (Signed)
Pre visit review using our clinic review tool, if applicable. No additional management support is needed unless otherwise documented below in the visit note.  Change dosage and take 1 tablet daily except take 2 tablets on  Saturdays.  Re-check in 6 weeks.

## 2019-04-18 ENCOUNTER — Ambulatory Visit: Payer: Medicare Other | Attending: Internal Medicine

## 2019-04-18 DIAGNOSIS — Z23 Encounter for immunization: Secondary | ICD-10-CM

## 2019-04-18 NOTE — Progress Notes (Signed)
   Covid-19 Vaccination Clinic  Name:  Jose Bonilla    MRN: [id number] DOB: 1945-10-14  04/18/2019  [name] was observed post Covid-19 immunization for 15 minutes without incident. He was provided with Vaccine Information Sheet and instruction to access the V-Safe system.   [name] was instructed to call 911 with any severe reactions post vaccine: Marland Kitchen Difficulty breathing  . Swelling of face and throat  . A fast heartbeat  . A bad rash all over body  . Dizziness and weakness   Immunizations Administered    Name Date Dose VIS Date Route   Moderna COVID-19 Vaccine 04/18/2019 10:53 AM 0.5 mL 01/06/2019 Intramuscular   Manufacturer: Moderna   Lot: [id number]   [id number]

## 2019-04-25 DIAGNOSIS — Z7901 Long term (current) use of anticoagulants: Secondary | ICD-10-CM | POA: Diagnosis not present

## 2019-04-25 DIAGNOSIS — R110 Nausea: Secondary | ICD-10-CM | POA: Diagnosis not present

## 2019-04-25 DIAGNOSIS — R220 Localized swelling, mass and lump, head: Secondary | ICD-10-CM | POA: Diagnosis not present

## 2019-04-25 DIAGNOSIS — M542 Cervicalgia: Secondary | ICD-10-CM | POA: Diagnosis not present

## 2019-04-25 DIAGNOSIS — R918 Other nonspecific abnormal finding of lung field: Secondary | ICD-10-CM | POA: Diagnosis not present

## 2019-04-25 DIAGNOSIS — I4891 Unspecified atrial fibrillation: Secondary | ICD-10-CM | POA: Diagnosis not present

## 2019-04-25 DIAGNOSIS — R58 Hemorrhage, not elsewhere classified: Secondary | ICD-10-CM | POA: Diagnosis not present

## 2019-04-25 DIAGNOSIS — I10 Essential (primary) hypertension: Secondary | ICD-10-CM | POA: Diagnosis not present

## 2019-04-25 DIAGNOSIS — S066X0A Traumatic subarachnoid hemorrhage without loss of consciousness, initial encounter: Secondary | ICD-10-CM | POA: Diagnosis not present

## 2019-04-25 DIAGNOSIS — R609 Edema, unspecified: Secondary | ICD-10-CM | POA: Diagnosis not present

## 2019-04-25 DIAGNOSIS — S0003XA Contusion of scalp, initial encounter: Secondary | ICD-10-CM | POA: Diagnosis not present

## 2019-04-25 DIAGNOSIS — R519 Headache, unspecified: Secondary | ICD-10-CM | POA: Diagnosis not present

## 2019-04-25 DIAGNOSIS — Z5329 Procedure and treatment not carried out because of patient's decision for other reasons: Secondary | ICD-10-CM | POA: Diagnosis not present

## 2019-04-25 DIAGNOSIS — S199XXA Unspecified injury of neck, initial encounter: Secondary | ICD-10-CM | POA: Diagnosis not present

## 2019-04-25 DIAGNOSIS — S0990XA Unspecified injury of head, initial encounter: Secondary | ICD-10-CM | POA: Diagnosis not present

## 2019-04-27 ENCOUNTER — Telehealth: Payer: Self-pay | Admitting: Family Medicine

## 2019-04-27 NOTE — Telephone Encounter (Signed)
Pt has been scheduled for a virtual visit  
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
## 2019-04-28 ENCOUNTER — Telehealth (INDEPENDENT_AMBULATORY_CARE_PROVIDER_SITE_OTHER): Payer: Medicare Other | Admitting: Family Medicine

## 2019-04-28 ENCOUNTER — Other Ambulatory Visit: Payer: Self-pay

## 2019-04-28 DIAGNOSIS — S0990XA Unspecified injury of head, initial encounter: Secondary | ICD-10-CM | POA: Diagnosis not present

## 2019-04-28 DIAGNOSIS — S060X1A Concussion with loss of consciousness of 30 minutes or less, initial encounter: Secondary | ICD-10-CM | POA: Diagnosis not present
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
## 2019-04-29 ENCOUNTER — Other Ambulatory Visit: Payer: Self-pay

## 2019-04-29 ENCOUNTER — Ambulatory Visit (INDEPENDENT_AMBULATORY_CARE_PROVIDER_SITE_OTHER): Payer: Medicare Other | Admitting: Family Medicine

## 2019-04-29 ENCOUNTER — Encounter: Payer: Self-pay | Admitting: Family Medicine

## 2019-04-29 VITALS — BP 138/88 | HR 110 | Temp 97.6°F | Wt 230.3 lb

## 2019-04-29 DIAGNOSIS — R42 Dizziness and giddiness: Secondary | ICD-10-CM

## 2019-04-29 DIAGNOSIS — Z7901 Long term (current) use of anticoagulants: Secondary | ICD-10-CM

## 2019-04-29 DIAGNOSIS — S0990XD Unspecified injury of head, subsequent encounter: Secondary | ICD-10-CM

## 2019-04-29 DIAGNOSIS — S060X1D Concussion with loss of consciousness of 30 minutes or less, subsequent encounter: Secondary | ICD-10-CM | POA: Diagnosis not present

## 2019-04-29 DIAGNOSIS — I4891 Unspecified atrial fibrillation: Secondary | ICD-10-CM | POA: Diagnosis not present

## 2019-04-29 NOTE — Patient Instructions (Signed)
Concussion, Adult  A concussion is a brain injury from a hard, direct hit (trauma) to the head or body. This direct hit causes the brain to shake quickly back and forth inside the skull. This can damage brain cells and cause chemical changes in the brain. A concussion may also be known as a mild traumatic brain injury (TBI). Concussions are usually not life-threatening, but the effects of a concussion can be serious. If you have a concussion, you should be very careful to avoid having a second concussion. What are the causes? This condition is caused by:  A direct hit to your head, such as: ? Running into another player during a game. ? Being hit in a fight. ? Hitting your head on a hard surface.  Sudden movement of your body that causes your brain to move back and forth inside the skull, such as in a car crash. What are the signs or symptoms? The signs of a concussion can be hard to notice. Early on, they may be missed by you, family members, and health care providers. You may look fine on the outside but may act or feel differently. Symptoms are usually temporary and most often improve in 7-10 days. Some symptoms appear right away, but other symptoms may not show up for hours or days. If your symptoms last longer than normal, you may have post-concussion syndrome. Every head injury is different. Physical symptoms  Headaches. This can include a feeling of pressure in the head or migraine-like symptoms.  Tiredness (fatigue).  Dizziness.  Problems with coordination or balance.  Vision or hearing problems.  Sensitivity to light or noise.  Nausea or vomiting.  Changes in eating or sleeping patterns.  Numbness or tingling.  Seizure. Mental and emotional symptoms  Memory problems.  Trouble concentrating, organizing, or making decisions.  Slowness in thinking, acting or reacting, speaking, or reading.  Irritability or mood changes.  Anxiety or depression. How is this  diagnosed? This condition is diagnosed based on:  Your symptoms.  A description of your injury. You may also have tests, including:  Imaging tests, such as a CT scan or MRI.  Neuropsychological tests. These measure your thinking, understanding, learning, and remembering abilities. How is this treated? Treatment for this condition includes:  Stopping sports or activity if you are injured. If you hit your head or show signs of concussion: ? Do not return to sports or activities the same day. ? Get checked by a health care provider before you return to your activities.  Physical and mental rest and careful observation, usually at home. Gradually return to your normal activities.  Medicines to help with symptoms such as headaches, nausea, or difficulty sleeping. ? Avoid taking opioid pain medicine while recovering from a concussion.  Avoiding alcohol and drugs. These may slow your recovery and can put you at risk of further injury.  Referral to a concussion clinic or rehabilitation center. Recovery from a concussion can take time. How fast you recover depends on many factors. Return to activities only when:  Your symptoms are completely gone.  Your health care provider says that it is safe. Follow these instructions at home: Activity  Limit activities that require a lot of thought or concentration, such as: ? Doing homework or job-related work. ? Watching TV. ? Working on the computer or phone. ? Playing memory games and puzzles.  Rest. Rest helps your brain heal. Make sure you: ? Get plenty of sleep. Most adults should get 7-9 hours of sleep   each night. ? Rest during the day. Take naps or rest breaks when you feel tired.  Avoid physical activity like exercise until your health care provider says it is safe. Stop any activity that worsens symptoms.  Do not do high-risk activities that could cause a second concussion, such as riding a bike or playing sports.  Ask your  health care provider when you can return to your normal activities, such as school, work, athletics, and driving. Your ability to react may be slower after a brain injury. Never do these activities if you are dizzy. Your health care provider will likely give you a plan for gradually returning to activities. General instructions   Take over-the-counter and prescription medicines only as told by your health care provider. Some medicines, such as blood thinners (anticoagulants) and aspirin, may increase the risk for complications, such as bleeding.  Do not drink alcohol until your health care provider says you can.  Watch your symptoms and tell others around you to do the same. Complications sometimes occur after a concussion. Older adults with a brain injury may have a higher risk of serious complications.  Tell your work manager, teachers, school nurse, school counselor, coach, or athletic trainer about your injury, symptoms, and restrictions.  Keep all follow-up visits as told by your health care provider. This is important. How is this prevented? Avoiding another brain injury is very important. In rare cases, another injury can lead to permanent brain damage, brain swelling, or death. The risk of this is greatest during the first 7-10 days after a head injury. Avoid injuries by:  Stopping activities that could lead to a second concussion, such as contact or recreational sports, until your health care provider says it is okay.  Taking these actions once you have returned to sports or activities: ? Avoiding plays or moves that can cause you to crash into another person. This is how most concussions occur. ? Following the rules and being respectful of other players. Do not engage in violent or illegal plays.  Getting regular exercise that includes strength and balance training.  Wearing a properly fitting helmet during sports, biking, or other activities. Helmets can help protect you from  serious skull and brain injuries, but they do not protect you from a concussion. Even when wearing a helmet, you should avoid being hit in the head. Contact a health care provider if:  Your symptoms get worse or they do not improve.  You have new symptoms.  You have another injury. Get help right away if:  You have severe or worsening headaches.  You have weakness or numbness in any part of your body.  You are confused.  Your coordination gets worse.  You vomit repeatedly.  You are sleepier than normal.  Your speech is slurred.  You cannot recognize people or places.  You have a seizure.  It is difficult to wake you up.  You have unusual behavior changes.  You have changes in your vision.  You lose consciousness. Summary  A concussion is a brain injury that results from a hard, direct hit (trauma) to your head or body.  You may have imaging tests and neuropsychological tests to diagnose a concussion.  Treatment for this condition includes physical and mental rest and careful observation.  Ask your health care provider when you can return to your normal activities, such as school, work, athletics, and driving.  Get help right away if you have a severe headache, weakness on one side of the   body, seizures, behavior changes, changes in vision, or if you are confused or sleepier than normal. This information is not intended to replace advice given to you by your health care provider. Make sure you discuss any questions you have with your health care provider. Document Revised: 09/12/2017 Document Reviewed: 09/12/2017 Elsevier Patient Education  2020 [employer].  
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
## 2019-05-04 ENCOUNTER — Other Ambulatory Visit: Payer: Self-pay | Admitting: Cardiology

## 2019-05-11 ENCOUNTER — Ambulatory Visit (INDEPENDENT_AMBULATORY_CARE_PROVIDER_SITE_OTHER): Payer: Medicare Other | Admitting: General Practice

## 2019-05-11 ENCOUNTER — Other Ambulatory Visit: Payer: Self-pay

## 2019-05-11 DIAGNOSIS — I4891 Unspecified atrial fibrillation: Secondary | ICD-10-CM

## 2019-05-11 DIAGNOSIS — Z7901 Long term (current) use of anticoagulants: Secondary | ICD-10-CM

## 2019-05-11 LAB — POCT INR: INR: 2 (ref 2.0–3.0)

## 2019-05-11 NOTE — Patient Instructions (Addendum)
Pre visit review using our clinic review tool, if applicable. No additional management support is needed unless otherwise documented below in the visit note.  Continue to take 1 tablet daily except take 2 tablets on  Saturdays.  Re-check in 6 weeks.

## 2019-05-13 ENCOUNTER — Ambulatory Visit: Payer: Medicare Other

## 2019-05-19 ENCOUNTER — Ambulatory Visit: Payer: Medicare Other

## 2019-06-15 ENCOUNTER — Other Ambulatory Visit: Payer: Self-pay | Admitting: Family Medicine

## 2019-06-15 DIAGNOSIS — Z7901 Long term (current) use of anticoagulants: Secondary | ICD-10-CM

## 2019-06-15 NOTE — Telephone Encounter (Signed)
Please advise 

## 2019-06-19 ENCOUNTER — Other Ambulatory Visit: Payer: Self-pay

## 2019-06-22 ENCOUNTER — Ambulatory Visit: Payer: Medicare Other

## 2019-06-29 ENCOUNTER — Other Ambulatory Visit: Payer: Self-pay

## 2019-06-29 ENCOUNTER — Ambulatory Visit (INDEPENDENT_AMBULATORY_CARE_PROVIDER_SITE_OTHER): Payer: Medicare Other | Admitting: General Practice

## 2019-06-29 DIAGNOSIS — Z7901 Long term (current) use of anticoagulants: Secondary | ICD-10-CM | POA: Diagnosis not present

## 2019-06-29 DIAGNOSIS — I4891 Unspecified atrial fibrillation: Secondary | ICD-10-CM

## 2019-06-29 LAB — POCT INR: INR: 2 (ref 2.0–3.0)

## 2019-06-29 NOTE — Patient Instructions (Addendum)
Pre visit review using our clinic review tool, if applicable. No additional management support is needed unless otherwise documented below in the visit note.  Continue to take 1 tablet daily except take 2 tablets on  Saturdays.  Re-check in 6 weeks.

## 2019-07-22 ENCOUNTER — Other Ambulatory Visit: Payer: Self-pay | Admitting: Family Medicine

## 2019-08-07 ENCOUNTER — Telehealth: Payer: Self-pay | Admitting: Family Medicine

## 2019-08-07 NOTE — Telephone Encounter (Signed)
Attempted to schedule AWV. Unable to LVM.  Will try at later time.  

## 2019-08-17 ENCOUNTER — Ambulatory Visit (INDEPENDENT_AMBULATORY_CARE_PROVIDER_SITE_OTHER): Payer: Medicare Other | Admitting: General Practice

## 2019-08-17 ENCOUNTER — Other Ambulatory Visit: Payer: Self-pay

## 2019-08-17 DIAGNOSIS — I4891 Unspecified atrial fibrillation: Secondary | ICD-10-CM

## 2019-08-17 DIAGNOSIS — Z7901 Long term (current) use of anticoagulants: Secondary | ICD-10-CM

## 2019-08-17 LAB — POCT INR: INR: 1.9 — AB (ref 2.0–3.0)

## 2019-08-17 NOTE — Patient Instructions (Addendum)
Pre visit review using our clinic review tool, if applicable. No additional management support is needed unless otherwise documented below in the visit note.  Take 1 tablet today and then continue to take 1 tablet daily except take 2 tablets on  Saturdays.  Re-check in 4 weeks.

## 2019-08-19 ENCOUNTER — Other Ambulatory Visit: Payer: Self-pay | Admitting: Cardiology

## 2019-08-19 NOTE — Telephone Encounter (Signed)
    1. Which medications need to be refilled? (please list name of each medication and dose if known)  DILTIAZEM 180 MG  2. Which pharmacy/location (including street and city if local pharmacy) is medication to be sent to? [place], [place], [place]   3. Do they need a 30 day or 90 day supply?

## 2019-08-19 NOTE — Telephone Encounter (Signed)
Already done

## 2019-09-14 ENCOUNTER — Ambulatory Visit (INDEPENDENT_AMBULATORY_CARE_PROVIDER_SITE_OTHER): Payer: Medicare Other | Admitting: General Practice

## 2019-09-14 ENCOUNTER — Other Ambulatory Visit: Payer: Self-pay

## 2019-09-14 DIAGNOSIS — Z7901 Long term (current) use of anticoagulants: Secondary | ICD-10-CM

## 2019-09-14 DIAGNOSIS — I4891 Unspecified atrial fibrillation: Secondary | ICD-10-CM

## 2019-09-14 LAB — POCT INR: INR: 1.7 — AB (ref 2.0–3.0)

## 2019-09-14 NOTE — Patient Instructions (Addendum)
Pre visit review using our clinic review tool, if applicable. No additional management support is needed unless otherwise documented below in the visit note.  Take 2 tablets today and then change dosage and take 1 tablet daily except 2 tablets on Wednesdays and Saturdays.  Re-check in 4 weeks.

## 2019-09-29 ENCOUNTER — Other Ambulatory Visit: Payer: Self-pay | Admitting: Family Medicine

## 2019-09-29 DIAGNOSIS — Z7901 Long term (current) use of anticoagulants: Secondary | ICD-10-CM

## 2019-09-29 NOTE — Telephone Encounter (Signed)
Please advise 

## 2019-09-30 ENCOUNTER — Other Ambulatory Visit: Payer: Self-pay | Admitting: Family Medicine

## 2019-10-14 ENCOUNTER — Ambulatory Visit (INDEPENDENT_AMBULATORY_CARE_PROVIDER_SITE_OTHER): Payer: Medicare Other | Admitting: General Practice

## 2019-10-14 ENCOUNTER — Other Ambulatory Visit: Payer: Self-pay

## 2019-10-14 DIAGNOSIS — I4891 Unspecified atrial fibrillation: Secondary | ICD-10-CM | POA: Diagnosis not present

## 2019-10-14 DIAGNOSIS — Z7901 Long term (current) use of anticoagulants: Secondary | ICD-10-CM

## 2019-10-14 LAB — POCT INR: INR: 1.7 — AB (ref 2.0–3.0)

## 2019-10-14 NOTE — Patient Instructions (Addendum)
Pre visit review using our clinic review tool, if applicable. No additional management support is needed unless otherwise documented below in the visit note.  Take a total of 3 tablets (7.5 mg) today and then continue to take 1 tablet daily except 2 tablets on Wednesdays and Saturdays.  Re-check in 3 weeks.

## 2019-10-22 ENCOUNTER — Other Ambulatory Visit: Payer: Self-pay | Admitting: Family Medicine

## 2019-11-04 ENCOUNTER — Ambulatory Visit: Payer: Medicare Other

## 2019-11-09 ENCOUNTER — Ambulatory Visit (INDEPENDENT_AMBULATORY_CARE_PROVIDER_SITE_OTHER): Payer: Medicare Other | Admitting: General Practice

## 2019-11-09 ENCOUNTER — Other Ambulatory Visit: Payer: Self-pay

## 2019-11-09 DIAGNOSIS — I4891 Unspecified atrial fibrillation: Secondary | ICD-10-CM

## 2019-11-09 DIAGNOSIS — Z7901 Long term (current) use of anticoagulants: Secondary | ICD-10-CM

## 2019-11-09 LAB — POCT INR: INR: 2.7 (ref 2.0–3.0)

## 2019-11-09 NOTE — Patient Instructions (Addendum)
Pre visit review using our clinic review tool, if applicable. No additional management support is needed unless otherwise documented below in the visit note.  Continue to take 1 tablet daily except 2 tablets on Wednesdays and Saturdays.  Re-check in 6 weeks.  

## 2019-11-11 ENCOUNTER — Telehealth: Payer: Self-pay | Admitting: Family Medicine

## 2019-11-11 NOTE — Progress Notes (Signed)
  Chronic Care Management   Outreach Note  11/11/2019 Name: Jose Bonilla MRN: [id number] DOB: 29-Jan-1946  Referred by: [name], MD Reason for referral : No chief complaint on file.   An unsuccessful telephone outreach was attempted today. The patient was referred to the pharmacist for assistance with care management and care coordination.   Follow Up Plan:   Carley Perdue [employer] Scheduler

## 2019-11-12 ENCOUNTER — Telehealth: Payer: Self-pay | Admitting: Family Medicine

## 2019-11-12 NOTE — Progress Notes (Signed)
  Chronic Care Management   Outreach Note  11/12/2019 Name: Jose Bonilla MRN: [id number] DOB: 1945-05-30  Referred by: [name], MD Reason for referral : No chief complaint on file.   A second unsuccessful telephone outreach was attempted today. The patient was referred to pharmacist for assistance with care management and care coordination.  Follow Up Plan:   Carley Perdue [employer] Scheduler

## 2019-11-18 ENCOUNTER — Telehealth: Payer: Self-pay | Admitting: Family Medicine

## 2019-11-18 NOTE — Progress Notes (Signed)
  Chronic Care Management   Outreach Note  11/18/2019 Name: Emmette Katt MRN: [id number] DOB: 06-23-1945  Referred by: [name], MD Reason for referral : No chief complaint on file.   A second unsuccessful telephone outreach was attempted today. The patient was referred to pharmacist for assistance with care management and care coordination.  Follow Up Plan:   Carley Perdue [employer] Scheduler

## 2019-11-27 ENCOUNTER — Telehealth: Payer: Self-pay

## 2019-11-27 MED ORDER — DOXYCYCLINE HYCLATE 100 MG PO TABS: 100.0000 mg | ORAL_TABLET | Freq: Two times a day (BID) | ORAL | 0 refills | Status: AC

## 2019-11-27 NOTE — Telephone Encounter (Signed)
Rx sent in & pt is aware of message below.

## 2019-11-27 NOTE — Telephone Encounter (Signed)
Send in doxycycline 100 mg p.o. twice daily for 7 days.  Recommend office follow-up if not better in 1 week

## 2019-11-27 NOTE — Telephone Encounter (Signed)
Patient called in wanting to speak with Dr or Nurse. Patient is having some sinus issues. Patient had a real bad sore throat and roof of mouth. Then started with the throat. That has stopped and now is having some sinus drainage and hurting behind the eyes. Thinking he needs an antibiotic sent in. Wanting to know what he should do. Was tested for covid on Monday 11/23/19 with negative results    Please call and advise

## 2019-11-27 NOTE — Telephone Encounter (Signed)
Please advise 

## 2019-12-09 ENCOUNTER — Telehealth: Payer: Self-pay | Admitting: Family Medicine

## 2019-12-09 NOTE — Progress Notes (Signed)
  Chronic Care Management   Outreach Note  12/09/2019 Name: Jari Dipasquale MRN: [id number] DOB: 1945-02-08  Referred by: [name], MD Reason for referral : No chief complaint on file.   Third unsuccessful telephone outreach was attempted today. The patient was referred to the pharmacist for assistance with care management and care coordination.   Follow Up Plan:   Carley Perdue [employer] Scheduler

## 2019-12-09 NOTE — Progress Notes (Signed)
  Chronic Care Management   Note  12/09/2019 Name: Jose Bonilla MRN: [id number] DOB: [birth year]  [name] is a 75 y.o. year old male who is a primary care patient of Burchette, Alinda Sierras, MD. I reached out to [name] by phone today in response to a referral sent by Jose Bonilla PCP, [name], MD.   Jose Bonilla was given information about Chronic Care Management services today including:  1. CCM service includes personalized support from designated clinical staff supervised by his physician, including individualized plan of care and coordination with other care providers 2. 24/7 contact phone numbers for assistance for urgent and routine care needs. 3. Service will only be billed when office clinical staff spend 20 minutes or more in a month to coordinate care. 4. Only one practitioner may furnish and bill the service in a calendar month. 5. The patient may stop CCM services at any time (effective at the end of the month) by phone call to the office staff.   Patient agreed to services and verbal consent obtained.   Follow up plan:   Carley Perdue [employer] Scheduler

## 2019-12-10 ENCOUNTER — Telehealth: Payer: Self-pay | Admitting: Pharmacist

## 2019-12-10 NOTE — Chronic Care Management (AMB) (Signed)
    Chronic Care Management Pharmacy Assistant   Name: Jose Bonilla  MRN: [id number] DOB: [birth year]  Reason for Encounter: Medication Review/Initial Questions for Pharmacist visit on 12/14/2019  Patient Questions: 1. Have you seen any other providers since your last visit? No 2. Any changes in your medications or health? No 3. Any side effects from any medications? No 4. Do you have any symptoms or problems not managed by your medications? No 5. Any concerns about your health right now? No 6. Has your provider asked that you check blood pressure, blood sugar, or follow a special diet at home? No 7. Do you get any type of exercise regularly? Yes, working  8. Can you think of a goal you would like to reach for your health? Yes, he would like to lose about 10 lbs. 9. Do you have any problems getting your medications? No  10. Is there anything that you would like to discuss during the appointment? No  The patient was asked to please bring medications, blood pressure/ blood sugar log, and supplements to your appointment.  PCP : [name], MD  Allergies:   Allergies  Allergen Reactions  . Indomethacin Other (See Comments)    Increased heart rate and elevated BP per patient    Medications: Outpatient Encounter Medications as of 12/10/2019  Medication Sig  . allopurinol (ZYLOPRIM) 300 MG tablet Take 1 tablet (300 mg total) by mouth daily.  Marland Kitchen atenolol (TENORMIN) 100 MG tablet Take 1/2 (one-half) tablet by mouth twice daily  . diclofenac Sodium (VOLTAREN) 1 % GEL Apply 2 g topically 4 (four) times daily.  Marland Kitchen diltiazem (CARDIZEM CD) 180 MG 24 hr capsule Take 1 capsule by mouth once daily  . lisinopril (ZESTRIL) 20 MG tablet Take 1 tablet by mouth once daily  . warfarin (COUMADIN) 2.5 MG tablet TAKE 1 TABLET BY MOUTH ONCE DAILY EXCEPT  TAKE  2  TABLETS  ON  WEDNESDAY  OR  AS  DIRECTED   No facility-administered encounter medications on file as of 12/10/2019.    Current  Diagnosis: Patient Active Problem List   Diagnosis Date Noted  . Long term (current) use of anticoagulants 10/17/2016  . Acute gout 02/05/2016  . Chronic gout 02/05/2016  . Encounter for therapeutic drug monitoring 03/19/2013  . Atrial fibrillation ([place])   . Ejection fraction < 50%   . Warfarin anticoagulation   . CVA (cerebral infarction)   . Overweight(278.02)   . Edema   . Hypertension 05/10/2011    Goals Addressed   None     Follow-Up:  Pharmacist Review   Maia Breslow, [place] Assistant [phone]

## 2019-12-10 NOTE — Progress Notes (Signed)
Erroneous encounter

## 2019-12-14 ENCOUNTER — Telehealth: Payer: Medicare Other

## 2019-12-15 ENCOUNTER — Ambulatory Visit: Payer: Medicare Other

## 2019-12-15 ENCOUNTER — Telehealth: Payer: Self-pay | Admitting: *Deleted

## 2019-12-15 DIAGNOSIS — I10 Essential (primary) hypertension: Secondary | ICD-10-CM

## 2019-12-15 DIAGNOSIS — M1A9XX1 Chronic gout, unspecified, with tophus (tophi): Secondary | ICD-10-CM

## 2019-12-15 DIAGNOSIS — Z7901 Long term (current) use of anticoagulants: Secondary | ICD-10-CM

## 2019-12-15 DIAGNOSIS — I4891 Unspecified atrial fibrillation: Secondary | ICD-10-CM

## 2019-12-15 NOTE — Telephone Encounter (Signed)
-----   Message from Viona Gilmore, [facility] sent at 12/15/2019  1:46 PM EST ----- Regarding: CCM referral Hi again Apolonio Schneiders,  Can you please put in a CCM referral for another Dr. Elease Hashimoto patient Mr. Angelina Neece?  Thank you so much for your help!  Best, Maddie

## 2019-12-16 ENCOUNTER — Telehealth: Payer: Self-pay | Admitting: Pharmacist

## 2019-12-16 NOTE — Chronic Care Management (AMB) (Signed)
    Chronic Care Management Pharmacy Assistant   Name: Jose Bonilla  MRN: [id number] DOB: 05-Mar-1945   Reason for Encounter: Medication Review/Initial Questions for Pharmacist visit on 12/17/2019   Patient Questions: 1. Have you seen any other providers since your last visit? No 2. Any changes in your medications or health? No 3. Any side effects from any medications? No 4. 4. Do you have any symptoms or problems not managed by your medications?  . He would like to talk about his hypertension medication 5. Any concerns about your health right now? No 6. Has your provider asked that you check blood pressure, blood sugar, or follow a special diet at home? Yes, twice a day 7. Do you get any type of exercise regularly? He is working and doing Haematologist. 8. Can you think of a goal you would like to reach for your health?  . He would like to lose about 10-15 lbs. 9. Do you have any problems getting your medications? No 10. Is there anything that you would like to discuss during the appointment? No  The patient was asked to please bring medications, blood pressure/ blood sugar log, and supplements to your appointment.   PCP : [name], MD  Allergies:   Allergies  Allergen Reactions  . Indomethacin Other (See Comments)    Increased heart rate and elevated BP per patient    Medications: Outpatient Encounter Medications as of 12/16/2019  Medication Sig  . allopurinol (ZYLOPRIM) 300 MG tablet Take 1 tablet (300 mg total) by mouth daily.  Marland Kitchen atenolol (TENORMIN) 100 MG tablet Take 1/2 (one-half) tablet by mouth twice daily  . diclofenac Sodium (VOLTAREN) 1 % GEL Apply 2 g topically 4 (four) times daily.  Marland Kitchen diltiazem (CARDIZEM CD) 180 MG 24 hr capsule Take 1 capsule by mouth once daily  . lisinopril (ZESTRIL) 20 MG tablet Take 1 tablet by mouth once daily  . warfarin (COUMADIN) 2.5 MG tablet TAKE 1 TABLET BY MOUTH ONCE DAILY EXCEPT  TAKE  2  TABLETS  ON  WEDNESDAY  OR  AS   DIRECTED   No facility-administered encounter medications on file as of 12/16/2019.    Current Diagnosis: Patient Active Problem List   Diagnosis Date Noted  . Long term (current) use of anticoagulants 10/17/2016  . Acute gout 02/05/2016  . Chronic gout 02/05/2016  . Encounter for therapeutic drug monitoring 03/19/2013  . Atrial fibrillation ([place])   . Ejection fraction < 50%   . Warfarin anticoagulation   . CVA (cerebral infarction)   . Overweight(278.02)   . Edema   . Hypertension 05/10/2011    Goals Addressed   None     Follow-Up:  Pharmacist Review   Maia Breslow, [place] Assistant [phone]

## 2019-12-17 ENCOUNTER — Ambulatory Visit: Payer: Medicare Other | Admitting: Pharmacist

## 2019-12-17 DIAGNOSIS — M1A9XX1 Chronic gout, unspecified, with tophus (tophi): Secondary | ICD-10-CM

## 2019-12-17 DIAGNOSIS — I10 Essential (primary) hypertension: Secondary | ICD-10-CM

## 2019-12-17 NOTE — Chronic Care Management (AMB) (Signed)
Chronic Care Management Pharmacy  Name: Jose Bonilla  MRN: [id number] DOB: [birth year]  Initial Planning Appointment: completed 12/16/19  Initial Questions: 1. Have you seen any other providers since your last visit? n/a 2. Any changes in your medicines or health? No   Chief Complaint/ HPI  Amanda Pote,  74 y.o. , male presents for their Initial CCM visit with the clinical pharmacist via telephone due to COVID-19 Pandemic.  PCP : [name], MD  Their chronic conditions include: HTN, Afib, gout, arthritis  Office Visits: -11/09/19 Meriam Sprague, RN (cardiology): Patient presented for anti-coag visit. INR 2.7, goal 2-3. Continue warfarin 5 mg every Wed and Sat and 2.5 mg on all other days.  Follow up in 6 weeks.  -04/29/19 Carolann Littler, MD: Patient presented for concussion evaluation. Patient reported loss of consciousness for 20-30 seconds and has some mild dizziness. Initial heart rate was elevated.   -04/28/19 Carolann Littler, MD: Patient presented for telephone visit for concussion evaluation. Patient had a fall and head injury on Saturday and reports feeling sore all over. Recommended in office visit tomorrow.  -01/19/19 Carolann Littler, MD: Patient presented for arthritis in both hands. Patient will consider OTC glucosamine and prescribed trial of diclofenac 1% gel.   Consult Visit: -04/25/19 Patient presented to the ED post fall.  -01/15/19 Rozann Lesches, MD (cardiology): Patient presented for Afib follow up. Ordered EKG and Echo. BP elevated in office. EKG showed rapid atrial fibrillation at 116 BPM. Follow up in 1 year.  Medications: Outpatient Encounter Medications as of 12/17/2019  Medication Sig  . diltiazem (CARDIZEM CD) 180 MG 24 hr capsule Take 1 capsule by mouth once daily  . ibuprofen (ADVIL) 200 MG tablet Take 200 mg by mouth every 6 (six) hours as needed.  . [DISCONTINUED] allopurinol (ZYLOPRIM) 300 MG tablet Take 1 tablet (300 mg total) by mouth  daily.  . [DISCONTINUED] atenolol (TENORMIN) 100 MG tablet Take 1/2 (one-half) tablet by mouth twice daily  . [DISCONTINUED] lisinopril (ZESTRIL) 20 MG tablet Take 1 tablet by mouth once daily  . [DISCONTINUED] warfarin (COUMADIN) 2.5 MG tablet TAKE 1 TABLET BY MOUTH ONCE DAILY EXCEPT  TAKE  2  TABLETS  ON  WEDNESDAY  OR  AS  DIRECTED  . [DISCONTINUED] diclofenac Sodium (VOLTAREN) 1 % GEL Apply 2 g topically 4 (four) times daily. (Patient not taking: Reported on 12/18/2019)   No facility-administered encounter medications on file as of 12/17/2019.   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 He also spends some time working on his small garden and watches TV.   Patient reports his diet is better than it used to be. He has never really eaten a lot of sweets, drinks the ocasional soda, and drinks tea with Stevia. He does eat a lot of bread and has tried to cut back lately. He often craves fruit such as blueberries, strawberries and blackberries and does like veggies. For meat, he has been eating more salmon and chicken and eats less red meat. He also eats out about 2-3 times a week.   For exercise, he still does some jobs building a sound booth and some yard work but does not do any structured exercise. His wife does the laundry and cleaning at home.  Patient sleeps in his recliner sometimes because he gets sore in bed. He often turns his TV on to help with sleep and feels tired the next day. He reports he  has a newer and excellent mattress. Patient reports that some nights he gets 5 hours of sleep. Recommended reading before bedtime instead of TV.   Patient reports he is currently having a blood pressure problem and has occasional ringing in the ears with high BP.   Current Diagnosis/Assessment:  Goals Addressed            This Visit's Progress   . Pharmacy Care Plan       CARE PLAN ENTRY (see longitudinal plan of  care for additional care plan information)  Current Barriers:  . Chronic Disease Management support, education, and care coordination needs related to Hypertension, Atrial Fibrillation, Gout, and arthritis/pain   Hypertension BP Readings from Last 3 Encounters:  04/29/19 138/88  01/19/19 (!) 146/92  01/15/19 (!) 150/92   . Pharmacist Clinical Goal(s): o Over the next 14 days, patient will work with PharmD and providers to achieve BP goal <130/80 . Current regimen:  . Diltiazem 180 mg 1 capsule daily  . Atenolol 100 mg 1/2 tablet twice daily . Lisinopril 20 mg 1 tablet daily . Interventions: o Discussed DASH eating plan recommendations: . Emphasizes vegetables, fruits, and whole-grains . Includes fat-free or low-fat dairy products, fish, poultry, beans, nuts, and vegetable oils . Limits foods that are high in saturated fat. These foods include fatty meats, full-fat dairy products, and tropical oils such as coconut, palm kernel, and palm oils. . Limits sugar-sweetened beverages and sweets . Limiting sodium intake to < 1500 mg/day o Discussed recommendations for moderate aerobic exercise for 150 minutes/week spread out over 5 days for heart healthy lifestyle . Patient self care activities - Over the next 14 days, patient will: o Check blood pressure daily, document, and provide at future appointments o Ensure daily salt intake < 2300 mg/day  AFib Pulse Readings from Last 3 Encounters:  04/29/19 (!) 110  01/19/19 76  01/15/19 92   . Pharmacist Clinical Goal(s): o Over the next 90 days, patient will work with PharmD and providers to maintain pulse < 110 beats per minute . Current regimen:  . Diltiazem 180 mg 1 capsule daily . Atenolol 100 mg 1/2 tablet twice daily . Warfarin 2.5 mg tablet 5 mg every Wed and Sat and 2.5 mg on all other days  . Interventions: o Discussed monitoring for signs of bleeding such as unexplained and excessive bleeding from a cut or injury, easy or  excessive bruising, blood in urine or stools, and nosebleeds without a known cause . Patient self care activities - Over the next 90 days, patient will: o Continue current medications  Gout Lab Results  Component Value Date   LABURIC 7.3 06/20/2016   . Pharmacist Clinical Goal(s): o Over the next 90 days, patient will work with PharmD and providers to achieve uric acid < 6 and avoid recurrent gout symptoms . Current regimen:  o Allopurinol 300 mg 1 tablet daily  . Interventions: o Discussed avoiding/limiting foods with high purine content: . wild game, such as veal, venison, and duck . red meat . some seafood, including tuna, sardines, anchovies, herring, mussels, codfish, scallops, trout, and haddock . organ meat, such as liver, kidneys, and thymus glands, which are known as sweetbreads o Avoiding/limiting foods to allow the body to process purines more effectively: . High-fat foods: Fat holds uric acid in the kidneys, so a person should avoid fried foods, full-fat dairy products, rich desserts, and other high-fat items. . Alcohol: Beer and whiskey are high in purines, but some research shows  that all alcohol consumption can raise uric acid levels. Alcohol also causes dehydration, which hampers the body's ability to flush out uric acid. . Sweetened beverages: Fructose is an ingredient in many sweetened beverages, including fruit juices and sodas, and consuming too much puts a person at risk for gout. . Patient self care activities - Over the next 90 days, patient will: o Patient will start taking allopurinol consistently every day as prescribed  Arthritis/pain . Pharmacist Clinical Goal(s) o Over the next 90 days, patient will work with PharmD and providers to manage pain . Current regimen:  o Ibuprofen 200 mg 1 tablet three times daily . Interventions: o Discussed the risk of bleeding with using ibuprofen consistently with use of warfarin o Recommended use of Tylenol for  pain . Patient self care activities - Over the next 90 days, patient will: o Try to limit use of ibuprofen to avoid risk of bleeding  Medication management . Pharmacist Clinical Goal(s): o Over the next 90 days, patient will work with PharmD and providers to achieve optimal medication adherence . Current pharmacy: [facility] . Interventions o Comprehensive medication review performed. o Continue current medication management strategy . Patient self care activities - Over the next 90 days, patient will: o Take medications as prescribed o Report any questions or concerns to PharmD and/or provider(s)  Initial goal documentation       SDOH Interventions     Most Recent Value  SDOH Interventions  Financial Strain Interventions Intervention Not Indicated  Transportation Interventions Intervention Not Indicated       AFIB   Patient is currently rate controlled. Office heart rates are  Pulse Readings from Last 3 Encounters:  04/29/19 (!) 110  01/19/19 76  01/15/19 92    CHA2DS2-VASc Score =   5 The patient's score is based upon: stroke/TIA, HTN, age > 7   Patient has failed these meds in past: none Patient is currently controlled on the following medications:  . Diltiazem 180 mg 1 capsule daily . Atenolol 100 mg 1/2 tablet twice daily . Warfarin 2.5 mg tablet 5 mg every Wed and Sat and 2.5 mg on all other days   We discussed:  monitoring  for signs of bleeding such as unexplained and excessive bleeding from a cut or injury, easy or excessive bruising, blood in urine or stools, and nosebleeds without a known cause   Plan  Continue current medications  Hypertension   BP goal is:  <130/80  Office blood pressures are  BP Readings from Last 3 Encounters:  04/29/19 138/88  01/19/19 (!) 146/92  01/15/19 (!) 150/92   Patient checks BP at home 3-5x per week Patient home BP readings are ranging: 160/100, lunchtime 120/75, 130/85, occasionally 175/110 (anxious, ringing in  ear)  Patient has failed these meds in the past: lisinopril-HCTZ (unknown) Patient is currently uncontrolled on the following medications:  . Diltiazem 180 mg 1 capsule daily - in PM . Atenolol 100 mg 1/2 tablet twice daily . Lisinopril 20 mg 1 tablet daily - in AM  We discussed diet and exercise extensively -DASH eating plan recommendations: . Emphasizes vegetables, fruits, and whole-grains . Includes fat-free or low-fat dairy products, fish, poultry, beans, nuts, and vegetable oils . Limits foods that are high in saturated fat. These foods include fatty meats, full-fat dairy products, and tropical oils such as coconut, palm kernel, and palm oils. . Limits sugar-sweetened beverages and sweets . Limiting sodium intake to < 1500 mg/day -Discussed recommendations for moderate aerobic exercise  for 150 minutes/week spread out over 5 days for heart healthy lifestyle -Patient is working on deep breathing with high blood pressure  Plan BP assessment with CPA in 2 weeks. Continue current medications    Gout    Lab Results  Component Value Date   LABURIC 7.3 06/20/2016   LABURIC 7.2 11/18/2014   LABURIC 10.4 (H) 08/16/2014   Patient is currently controlled on:  . Allopurinol 300 mg 1 tablet daily   We discussed: Avoiding/limiting foods with high purine content: . wild game, such as veal, venison, and duck . red meat . some seafood, including tuna, sardines, anchovies, herring, mussels, codfish, scallops, trout, and haddock . organ meat, such as liver, kidneys, and thymus glands, which are known as sweetbreads  Avoiding/limiting foods to allow the body to process purines more effectively: . High-fat foods: Fat holds uric acid in the kidneys, so a person should avoid fried foods, full-fat dairy products, rich desserts, and other high-fat items. . Alcohol: Beer and whiskey are high in purines, but some research shows that all alcohol consumption can raise uric acid levels. Alcohol also  causes dehydration, which hampers the body's ability to flush out uric acid. . Sweetened beverages: Fructose is an ingredient in many sweetened beverages, including fruit juices and sodas, and consuming too much puts a person at risk for gout.  Plan Recommend repeat UA - patient stopped taking consistently as he didn't think he needed to anymore but restarted taking recently. Continue current medications  Arthritis/hand pain   Patient has failed these meds in past: Voltaren gel (ineffective) Patient is currently uncontrolled on the following medications:  . Ibuprofen 200 mg 1 tablet three times daily PRN  We discussed:  Limiting ibuprofen use due to increased risk of bleeding with warfarin; recommended using OTC voltaren gel (patient reported didn't help) or Tylenol  Plan Will discuss with PCP about switching to Celebrex for lower risk of GI bleeding.  Continue current medications    Vaccines   Reviewed and discussed patient's vaccination history.    Immunization History  Administered Date(s) Administered  . Moderna SARS-COVID-2 Vaccination 03/21/2019, 04/18/2019    Plan  Recommended patient receive Shingles, tetanus, pneumonia, influenza vaccine in office/at pharmacy.    Medication Management   Pt uses [facility] for all medications Uses pill box? Yes Pt endorses 90% compliance - waits until lunch for some  We discussed: Current pharmacy is preferred with insurance plan and patient is satisfied with pharmacy services  Plan  Continue current medication management strategy   Follow up: 3 month phone visit  Jeni Salles, PharmD Clinical Pharmacist [facility] [place] [phone]

## 2019-12-18 ENCOUNTER — Other Ambulatory Visit: Payer: Self-pay

## 2019-12-18 ENCOUNTER — Ambulatory Visit (INDEPENDENT_AMBULATORY_CARE_PROVIDER_SITE_OTHER): Payer: Medicare Other

## 2019-12-18 DIAGNOSIS — Z0000 Encounter for general adult medical examination without abnormal findings: Secondary | ICD-10-CM

## 2019-12-18 NOTE — Patient Instructions (Addendum)
Jose Bonilla , Thank you for taking time to come for your Medicare Wellness Visit. I appreciate your ongoing commitment to your health goals. Please review the following plan we discussed and let me know if I can assist you in the future.   Screening recommendations/referrals: Colonoscopy: You are currently due for your colonoscopy please let us know when you would like for [place] to schedule you with a Gastroenterologist Recommended yearly ophthalmology/optometry visit for glaucoma screening and checkup Recommended yearly dental visit for hygiene and checkup  Vaccinations: Influenza vaccine: Patient declined Pneumococcal vaccine: Patient declined  Tdap vaccine: Currently due, if you wish to receive this vaccine you may do so at your next office visit  Shingles vaccine: Currently due for Shingrix, if you receive we recommend that you get at your local pharmacy.    Advanced directives: Advance directive discussed with you today. Even though you declined this today please call our office should you change your mind and we can give you the proper paperwork for you to fill out.   Conditions/risks identified: None   Next appointment: 12/21/2019 @ 3:30 PM for Coumadin check  Preventive Care 65 Years and Older, Male Preventive care refers to lifestyle choices and visits with your health care provider that can promote health and wellness. What does preventive care include?  A yearly physical exam. This is also called an annual well check.  Dental exams once or twice a year.  Routine eye exams. Ask your health care provider how often you should have your eyes checked.  Personal lifestyle choices, including:  Daily care of your teeth and gums.  Regular physical activity.  Eating a healthy diet.  Avoiding tobacco and drug use.  Limiting alcohol use.  Practicing safe sex.  Taking low doses of aspirin every day.  Taking vitamin and mineral supplements as recommended by your health care  provider. What happens during an annual well check? The services and screenings done by your health care provider during your annual well check will depend on your age, overall health, lifestyle risk factors, and family history of disease. Counseling  Your health care provider may ask you questions about your:  Alcohol use.  Tobacco use.  Drug use.  Emotional well-being.  Home and relationship well-being.  Sexual activity.  Eating habits.  History of falls.  Memory and ability to understand (cognition).  Work and work Statistician. Screening  You may have the following tests or measurements:  Height, weight, and BMI.  Blood pressure.  Lipid and cholesterol levels. These may be checked every 5 years, or more frequently if you are over 40 years old.  Skin check.  Lung cancer screening. You may have this screening every year starting at age 66 if you have a 30-pack-year history of smoking and currently smoke or have quit within the past 15 years.  Fecal occult blood test (FOBT) of the stool. You may have this test every year starting at age 17.  Flexible sigmoidoscopy or colonoscopy. You may have a sigmoidoscopy every 5 years or a colonoscopy every 10 years starting at age 38.  Prostate cancer screening. Recommendations will vary depending on your family history and other risks.  Hepatitis C blood test.  Hepatitis B blood test.  Sexually transmitted disease (STD) testing.  Diabetes screening. This is done by checking your blood sugar (glucose) after you have not eaten for a while (fasting). You may have this done every 1-3 years.  Abdominal aortic aneurysm (AAA) screening. You may need this if  you are a current or former smoker.  Osteoporosis. You may be screened starting at age 72 if you are at high risk. Talk with your health care provider about your test results, treatment options, and if necessary, the need for more tests. Vaccines  Your health care provider  may recommend certain vaccines, such as:  Influenza vaccine. This is recommended every year.  Tetanus, diphtheria, and acellular pertussis (Tdap, Td) vaccine. You may need a Td booster every 10 years.  Zoster vaccine. You may need this after age 45.  Pneumococcal 13-valent conjugate (PCV13) vaccine. One dose is recommended after age 63.  Pneumococcal polysaccharide (PPSV23) vaccine. One dose is recommended after age 56. Talk to your health care provider about which screenings and vaccines you need and how often you need them. This information is not intended to replace advice given to you by your health care provider. Make sure you discuss any questions you have with your health care provider. Document Released: 02/18/2015 Document Revised: 10/12/2015 Document Reviewed: 11/23/2014 Elsevier Interactive Patient Education  2017 [employer] Prevention in the Home Falls can cause injuries. They can happen to people of all ages. There are many things you can do to make your home safe and to help prevent falls. What can I do on the outside of my home?  Regularly fix the edges of walkways and driveways and fix any cracks.  Remove anything that might make you trip as you walk through a door, such as a raised step or threshold.  Trim any bushes or trees on the path to your home.  Use bright outdoor lighting.  Clear any walking paths of anything that might make someone trip, such as rocks or tools.  Regularly check to see if handrails are loose or broken. Make sure that both sides of any steps have handrails.  Any raised decks and porches should have guardrails on the edges.  Have any leaves, snow, or ice cleared regularly.  Use sand or salt on walking paths during winter.  Clean up any spills in your garage right away. This includes oil or grease spills. What can I do in the bathroom?  Use night lights.  Install grab bars by the toilet and in the tub and shower. Do not use  towel bars as grab bars.  Use non-skid mats or decals in the tub or shower.  If you need to sit down in the shower, use a plastic, non-slip stool.  Keep the floor dry. Clean up any water that spills on the floor as soon as it happens.  Remove soap buildup in the tub or shower regularly.  Attach bath mats securely with double-sided non-slip rug tape.  Do not have throw rugs and other things on the floor that can make you trip. What can I do in the bedroom?  Use night lights.  Make sure that you have a light by your bed that is easy to reach.  Do not use any sheets or blankets that are too big for your bed. They should not hang down onto the floor.  Have a firm chair that has side arms. You can use this for support while you get dressed.  Do not have throw rugs and other things on the floor that can make you trip. What can I do in the kitchen?  Clean up any spills right away.  Avoid walking on wet floors.  Keep items that you use a lot in easy-to-reach places.  If you need to  reach something above you, use a strong step stool that has a grab bar.  Keep electrical cords out of the way.  Do not use floor polish or wax that makes floors slippery. If you must use wax, use non-skid floor wax.  Do not have throw rugs and other things on the floor that can make you trip. What can I do with my stairs?  Do not leave any items on the stairs.  Make sure that there are handrails on both sides of the stairs and use them. Fix handrails that are broken or loose. Make sure that handrails are as long as the stairways.  Check any carpeting to make sure that it is firmly attached to the stairs. Fix any carpet that is loose or worn.  Avoid having throw rugs at the top or bottom of the stairs. If you do have throw rugs, attach them to the floor with carpet tape.  Make sure that you have a light switch at the top of the stairs and the bottom of the stairs. If you do not have them, ask  someone to add them for you. What else can I do to help prevent falls?  Wear shoes that:  Do not have high heels.  Have rubber bottoms.  Are comfortable and fit you well.  Are closed at the toe. Do not wear sandals.  If you use a stepladder:  Make sure that it is fully opened. Do not climb a closed stepladder.  Make sure that both sides of the stepladder are locked into place.  Ask someone to hold it for you, if possible.  Clearly mark and make sure that you can see:  Any grab bars or handrails.  First and last steps.  Where the edge of each step is.  Use tools that help you move around (mobility aids) if they are needed. These include:  Canes.  Walkers.  Scooters.  Crutches.  Turn on the lights when you go into a dark area. Replace any light bulbs as soon as they burn out.  Set up your furniture so you have a clear path. Avoid moving your furniture around.  If any of your floors are uneven, fix them.  If there are any pets around you, be aware of where they are.  Review your medicines with your doctor. Some medicines can make you feel dizzy. This can increase your chance of falling. Ask your doctor what other things that you can do to help prevent falls. This information is not intended to replace advice given to you by your health care provider. Make sure you discuss any questions you have with your health care provider. Document Released: 11/18/2008 Document Revised: 06/30/2015 Document Reviewed: 02/26/2014 Elsevier Interactive Patient Education  2017 [employer].

## 2019-12-18 NOTE — Progress Notes (Signed)
Subjective:   Jose Bonilla is a 74 y.o. male who presents for Medicare Annual/Subsequent preventive examination.  I connected with Jose Bonilla today by telephone and verified that I am speaking with the correct person using two identifiers. Location patient: home Location provider: work Persons participating in the virtual visit: patient, provider.   I discussed the limitations, risks, security and privacy concerns of performing an evaluation and management service by telephone and the availability of in person appointments. I also discussed with the patient that there may be a patient responsible charge related to this service. The patient expressed understanding and verbally consented to this telephonic visit.    Interactive audio and video telecommunications were attempted between this provider and patient, however failed, due to patient having technical difficulties OR patient did not have access to video capability.  We continued and completed visit with audio only.      Review of Systems    N/A  Cardiac Risk Factors include: advanced age (>59men, >22 women);male gender;hypertension     Objective:    Today's Vitals   There is no height or weight on file to calculate BMI.  Advanced Directives 12/18/2019 03/14/2016  Does Patient Have a Medical Advance Directive? No No  Would patient like information on creating a medical advance directive? No - Patient declined -    Current Medications (verified) Outpatient Encounter Medications as of 12/18/2019  Medication Sig  . allopurinol (ZYLOPRIM) 300 MG tablet Take 1 tablet (300 mg total) by mouth daily.  Marland Kitchen atenolol (TENORMIN) 100 MG tablet Take 1/2 (one-half) tablet by mouth twice daily  . diltiazem (CARDIZEM CD) 180 MG 24 hr capsule Take 1 capsule by mouth once daily  . ibuprofen (ADVIL) 200 MG tablet Take 200 mg by mouth every 6 (six) hours as needed.  Marland Kitchen lisinopril (ZESTRIL) 20 MG tablet Take 1 tablet by mouth once daily  .  warfarin (COUMADIN) 2.5 MG tablet TAKE 1 TABLET BY MOUTH ONCE DAILY EXCEPT  TAKE  2  TABLETS  ON  WEDNESDAY  OR  AS  DIRECTED  . diclofenac Sodium (VOLTAREN) 1 % GEL Apply 2 g topically 4 (four) times daily. (Patient not taking: Reported on 12/18/2019)   No facility-administered encounter medications on file as of 12/18/2019.    Allergies (verified) Indomethacin   History: Past Medical History:  Diagnosis Date  . Chronic atrial fibrillation (HCC)    Coumadin therapy  . CVA (cerebral infarction)    Possible history  . Essential hypertension   . Gout   . Overweight(278.02)   . Seasonal allergies   . Secondary cardiomyopathy ([place])    LVEF 40-45% June 2013   History reviewed. No pertinent surgical history. Family History  Problem Relation Age of Onset  . Heart disease Mother   . Hypertension Father   . Diabetes Paternal Aunt    Social History   Socioeconomic History  . Marital status: Married    Spouse name: Not on file  . Number of children: Not on file  . Years of education: Not on file  . Highest education level: Not on file  Occupational History  . Not on file  Tobacco Use  . Smoking status: Never Smoker  . Smokeless tobacco: Never Used  Vaping Use  . Vaping Use: Never used  Substance and Sexual Activity  . Alcohol use: No    Alcohol/week: 0.0 standard drinks    Comment: no use   . Drug use: No  . Sexual activity: Not on file  Other Topics Concern  . Not on file  Social History Narrative   Lives with wife   Married x 57 years   Children; no children   Hobbies; garden   Social Determinants of Radio broadcast assistant Strain: Low Risk   . Difficulty of Paying Living Expenses: Not hard at all  Food Insecurity: No Food Insecurity  . Worried About Charity fundraiser in the Last Year: Never true  . Ran Out of Food in the Last Year: Never true  Transportation Needs: No Transportation Needs  . Lack of Transportation (Medical): No  . Lack of  Transportation (Non-Medical): No  Physical Activity: Inactive  . Days of Exercise per Week: 0 days  . Minutes of Exercise per Session: 0 min  Stress: No Stress Concern Present  . Feeling of Stress : Not at all  Social Connections: Moderately Integrated  . Frequency of Communication with Friends and Family: More than three times a week  . Frequency of Social Gatherings with Friends and Family: More than three times a week  . Attends Religious Services: More than 4 times per year  . Active Member of Clubs or Organizations: No  . Attends Archivist Meetings: Never  . Marital Status: Married    Tobacco Counseling Counseling given: Not Answered   Clinical Intake:  Pre-visit preparation completed: Yes  Pain : No/denies pain     Nutritional Risks: None Diabetes: No  How often do you need to have someone help you when you read instructions, pamphlets, or other written materials from your doctor or pharmacy?: 1 - Never What is the last grade level you completed in school?: Technical school  Diabetic?No  Interpreter Needed?: No  Information entered by :: [place] of Daily Living In your present state of health, do you have any difficulty performing the following activities: 12/18/2019  Hearing? Y  Comment Patient states has some issues with hearing around higher frequencies  Vision? N  Difficulty concentrating or making decisions? N  Walking or climbing stairs? N  Dressing or bathing? N  Doing errands, shopping? N  Preparing Food and eating ? N  Using the Toilet? N  In the past six months, have you accidently leaked urine? N  Do you have problems with loss of bowel control? N  Managing your Medications? N  Managing your Finances? N  Housekeeping or managing your Housekeeping? N  Some recent data might be hidden    Patient Care Team: [name], MD as PCP - General (Family Medicine) Satira Sark, MD as PCP - Cardiology  (Cardiology) Viona Gilmore, [facility] as Pharmacist (Pharmacist)  Indicate any recent Medical Services you may have received from other than [facility] providers in the past year (date may be approximate).     Assessment:   This is a routine wellness examination for Jose Bonilla.  Hearing/Vision screen  Hearing Screening   125Hz  250Hz  500Hz  1000Hz  2000Hz  3000Hz  4000Hz  6000Hz  8000Hz   Right ear:           Left ear:           Vision Screening Comments: Patient states has not had eyes examined in 25+ years  Dietary issues and exercise activities discussed: Current Exercise Habits: The patient has a physically strenuous job, but has no regular exercise apart from work.  Goals    . Exercise 3x per week (30 min per time)    . Weight (lb) < 200 lb (90.7 kg)  Will try to cut bread and stick to healthy foods (vegetables and fruits )   Food Choices to Lower Your Triglycerides Triglycerides are a type of fat in your blood. High levels of triglycerides can increase the risk of heart disease and stroke. If your triglyceride levels are high, the foods you eat and your eating habits are very important. Choosing the right foods can help lower your triglycerides. What general guidelines do I need to follow?  Lose weight if you are overweight.  Limit or avoid alcohol.  Fill one half of your plate with vegetables and green salads.  Limit fruit to two servings a day. Choose fruit instead of juice.  Make one fourth of your plate whole grains. Look for the word "whole" as the first word in the ingredient list.  Fill one fourth of your plate with lean protein foods.  Enjoy fatty fish (such as salmon, mackerel, sardines, and tuna) three times a week.  Choose healthy fats.  Limit foods high in starch and sugar.  Eat more home-cooked food and less restaurant, buffet, and fast food.  Limit fried foods.  Cook foods using methods other than frying.  Limit saturated fats.  Check ingredient lists to avoid  foods with partially hydrogenated oils (trans fats) in them. What foods can I eat? Grains  Whole grains, such as whole wheat or whole grain breads, crackers, cereals, and pasta. Unsweetened oatmeal, bulgur, barley, quinoa, or brown rice. Corn or whole wheat flour tortillas. Vegetables  Fresh or frozen vegetables (raw, steamed, roasted, or grilled). Green salads. Fruits  All fresh, canned (in natural juice), or frozen fruits. Meat and Other Protein Products  Ground beef (85% or leaner), grass-fed beef, or beef trimmed of fat. Skinless chicken or [place]. Ground chicken or [place]. Pork trimmed of fat. All fish and seafood. Eggs. Dried beans, peas, or lentils. Unsalted nuts or seeds. Unsalted canned or dry beans. Dairy  Low-fat dairy products, such as skim or 1% milk, 2% or reduced-fat cheeses, low-fat ricotta or cottage cheese, or plain low-fat yogurt. Fats and Oils  Tub margarines without trans fats. Light or reduced-fat mayonnaise and salad dressings. Avocado. Safflower, olive, or canola oils. Natural peanut or almond butter. The items listed above may not be a complete list of recommended foods or beverages. Contact your dietitian for more options.  What foods are not recommended? Grains  White bread. White pasta. White rice. Cornbread. Bagels, pastries, and croissants. Crackers that contain trans fat. Vegetables  White potatoes. Corn. Creamed or fried vegetables. Vegetables in a cheese sauce. Fruits  Dried fruits. Canned fruit in light or heavy syrup. Fruit juice. Meat and Other Protein Products  Fatty cuts of meat. Ribs, chicken wings, bacon, sausage, bologna, salami, chitterlings, fatback, hot dogs, bratwurst, and packaged luncheon meats. Dairy  Whole or 2% milk, cream, half-and-half, and cream cheese. Whole-fat or sweetened yogurt. Full-fat cheeses. Nondairy creamers and whipped toppings. Processed cheese, cheese spreads, or cheese curds. Sweets and Desserts  Corn syrup, sugars, honey,  and molasses. Candy. Jam and jelly. Syrup. Sweetened cereals. Cookies, pies, cakes, donuts, muffins, and ice cream. Fats and Oils  Butter, stick margarine, lard, shortening, ghee, or bacon fat. Coconut, palm kernel, or palm oils. Beverages  Alcohol. Sweetened drinks (such as sodas, lemonade, and fruit drinks or punches). The items listed above may not be a complete list of foods and beverages to avoid. Contact your dietitian for more information.  This information is not intended to replace advice given to you by your  health care provider. Make sure you discuss any questions you have with your health care provider. Document Released: 11/10/2003 Document Revised: 06/30/2015 Document Reviewed: 11/26/2012 Elsevier Interactive Patient Education  2017 [place] 2/9 Scores 12/18/2019 11/15/2017 03/14/2016 08/16/2014  PHQ - 2 Score 0 0 0 0  PHQ- 9 Score 0 - - -    Fall Risk Fall Risk  12/18/2019 11/15/2017 03/14/2016 09/30/2015 08/16/2014  Falls in the past year? 1 No No No No  Comment fell out of the car while car was backing up - - Emmi Telephone Survey: data to providers prior to load -  Number falls in past yr: 0 - - - -  Injury with Fall? 1 - - - -  Comment had a concussion - - - -  Risk for fall due to : No Fall Risks - - - -  Follow up Falls evaluation completed;Falls prevention discussed - - - -    Any stairs in or around the home? No  If so, are there any without handrails? No  Home free of loose throw rugs in walkways, pet beds, electrical cords, etc? Yes  Adequate lighting in your home to reduce risk of falls? Yes   ASSISTIVE DEVICES UTILIZED TO PREVENT FALLS:  Life alert? No  Use of a cane, [place] or w/c? No  Grab bars in the bathroom? No  Shower chair or bench in shower? No  Elevated toilet seat or a handicapped toilet? No    Cognitive Function: Cognitive screening not indicated based on direct observation. MMSE - Mini Mental State Exam  03/14/2016  Not completed: (No Data)        Immunizations Immunization History  Administered Date(s) Administered  . Moderna SARS-COVID-2 Vaccination 03/21/2019, 04/18/2019    TDAP status: Due, Education has been provided regarding the importance of this vaccine. Advised may receive this vaccine at local pharmacy or Health Dept. Aware to provide a copy of the vaccination record if obtained from local pharmacy or Health Dept. Verbalized acceptance and understanding. Flu Vaccine status: Declined, Education has been provided regarding the importance of this vaccine but patient still declined. Advised may receive this vaccine at local pharmacy or Health Dept. Aware to provide a copy of the vaccination record if obtained from local pharmacy or Health Dept. Verbalized acceptance and understanding. Pneumococcal vaccine status: Declined,  Education has been provided regarding the importance of this vaccine but patient still declined. Advised may receive this vaccine at local pharmacy or Health Dept. Aware to provide a copy of the vaccination record if obtained from local pharmacy or Health Dept. Verbalized acceptance and understanding.  Covid-19 vaccine status: Completed vaccines  Qualifies for Shingles Vaccine? Yes   Zostavax completed No   Shingrix Completed?: No.    Education has been provided regarding the importance of this vaccine. Patient has been advised to call insurance company to determine out of pocket expense if they have not yet received this vaccine. Advised may also receive vaccine at local pharmacy or Health Dept. Verbalized acceptance and understanding.  Screening Tests Health Maintenance  Topic Date Due  . TETANUS/TDAP  Never done  . PNA vac Low Risk Adult (1 of 2 - PCV13) Never done  . COLONOSCOPY  02/09/2011  . INFLUENZA VACCINE  Never done  . COVID-19 Vaccine  Completed  . Hepatitis C Screening  Completed    Health Maintenance  Health Maintenance Due  Topic Date Due  .  TETANUS/TDAP  Never done  . PNA vac Low Risk Adult (1 of 2 - PCV13) Never done  . COLONOSCOPY  02/09/2011  . INFLUENZA VACCINE  Never done    Colorectal cancer screening: Completed 02/08/2001. Repeat every 10 years  Lung Cancer Screening: (Low Dose CT Chest recommended if Age 71-80 years, 30 pack-year currently smoking OR have quit w/in 15years.) does not qualify.   Lung Cancer Screening Referral: N/A  Additional Screening:  Hepatitis C Screening: does qualify; Completed 11/15/2017  Vision Screening: Recommended annual ophthalmology exams for early detection of glaucoma and other disorders of the eye. Is the patient up to date with their annual eye exam?  No  Who is the provider or what is the name of the office in which the patient attends annual eye exams? Patient does not have eye doctor currently  If pt is not established with a provider, would they like to be referred to a provider to establish care? No .   Dental Screening: Recommended annual dental exams for proper oral hygiene  Community Resource Referral / Chronic Care Management: CRR required this visit?  No   CCM required this visit?  No      Plan:     I have personally reviewed and noted the following in the patient's chart:   . Medical and social history . Use of alcohol, tobacco or illicit drugs  . Current medications and supplements . Functional ability and status . Nutritional status . Physical activity . Advanced directives . List of other physicians . Hospitalizations, surgeries, and ER visits in previous 12 months . Vitals . Screenings to include cognitive, depression, and falls . Referrals and appointments  In addition, I have reviewed and discussed with patient certain preventive protocols, quality metrics, and best practice recommendations. A written personalized care plan for preventive services as well as general preventive health recommendations were provided to patient.     Ofilia Neas,  LPN   09/07/2334   Nurse Notes: None

## 2019-12-21 ENCOUNTER — Ambulatory Visit: Payer: Medicare Other

## 2019-12-22 ENCOUNTER — Other Ambulatory Visit: Payer: Self-pay | Admitting: Family Medicine

## 2019-12-25 ENCOUNTER — Other Ambulatory Visit: Payer: Self-pay | Admitting: Family Medicine

## 2019-12-27 ENCOUNTER — Other Ambulatory Visit: Payer: Self-pay | Admitting: Family Medicine

## 2019-12-27 DIAGNOSIS — Z7901 Long term (current) use of anticoagulants: Secondary | ICD-10-CM

## 2019-12-28 ENCOUNTER — Other Ambulatory Visit: Payer: Self-pay

## 2019-12-28 ENCOUNTER — Ambulatory Visit (INDEPENDENT_AMBULATORY_CARE_PROVIDER_SITE_OTHER): Payer: Medicare Other | Admitting: General Practice

## 2019-12-28 ENCOUNTER — Ambulatory Visit: Payer: Medicare Other | Admitting: Dermatology

## 2019-12-28 DIAGNOSIS — I4891 Unspecified atrial fibrillation: Secondary | ICD-10-CM | POA: Diagnosis not present

## 2019-12-28 DIAGNOSIS — Z7901 Long term (current) use of anticoagulants: Secondary | ICD-10-CM

## 2019-12-28 LAB — POCT INR: INR: 1.5 — AB (ref 2.0–3.0)

## 2019-12-28 NOTE — Patient Instructions (Signed)
Pre visit review using our clinic review tool, if applicable. No additional management support is needed unless otherwise documented below in the visit note.  Take 2 tablets today and tomorrow.  On Wednesday continue to take 1 tablet daily except 2 tablets on Wednesdays and Saturdays.  Re-check in 3 to 4 weeks.

## 2019-12-29 ENCOUNTER — Other Ambulatory Visit: Payer: Self-pay | Admitting: General Practice

## 2019-12-29 DIAGNOSIS — M1A00X0 Idiopathic chronic gout, unspecified site, without tophus (tophi): Secondary | ICD-10-CM

## 2019-12-29 DIAGNOSIS — I10 Essential (primary) hypertension: Secondary | ICD-10-CM

## 2019-12-29 DIAGNOSIS — Z7901 Long term (current) use of anticoagulants: Secondary | ICD-10-CM

## 2019-12-29 MED ORDER — WARFARIN SODIUM 2.5 MG PO TABS: ORAL_TABLET | ORAL | 0 refills | Status: DC

## 2019-12-29 MED ORDER — ATENOLOL 100 MG PO TABS: ORAL_TABLET | ORAL | 0 refills | Status: DC

## 2019-12-29 MED ORDER — ALLOPURINOL 300 MG PO TABS: 300.0000 mg | ORAL_TABLET | Freq: Every day | ORAL | 0 refills | Status: DC

## 2020-01-03 NOTE — Progress Notes (Signed)
I recommend he set up 30 minute office follow up.  We can check uric acid, but if not having gout flares and he will not be taking Allopurinol regularly probably of no benefit.  His stroke hx is somewhat equivocal.  I cannot recall why he is not on statin.  He has been followed also by cardiology in past and I'm sure they would have discussed with him.  Would prefer that he not be on any NSAID with coumadin use.  We will discuss.   Thanks for seeing him!  Darnell Level

## 2020-01-04 ENCOUNTER — Telehealth: Payer: Self-pay | Admitting: Pharmacist

## 2020-01-04 NOTE — Chronic Care Management (AMB) (Signed)
Chronic Care Management Pharmacy Assistant   Name: Jose Bonilla  MRN: [id number] DOB: October 05, 1945  Reason for Encounter: Disease State  Patient Questions:  1.  Have you seen any other providers since your last visit? No  2.  Any changes in your medicines or health? No   PCP : [name], MD  Allergies:   Allergies  Allergen Reactions  . Indomethacin Other (See Comments)    Increased heart rate and elevated BP per patient    Medications: Outpatient Encounter Medications as of 01/04/2020  Medication Sig  . allopurinol (ZYLOPRIM) 300 MG tablet Take 1 tablet (300 mg total) by mouth daily.  Marland Kitchen atenolol (TENORMIN) 100 MG tablet Take 1/2 (one-half) tablet by mouth twice daily  . diltiazem (CARDIZEM CD) 180 MG 24 hr capsule Take 1 capsule by mouth once daily  . ibuprofen (ADVIL) 200 MG tablet Take 200 mg by mouth every 6 (six) hours as needed.  Marland Kitchen lisinopril (ZESTRIL) 20 MG tablet Take 1 tablet by mouth once daily  . warfarin (COUMADIN) 2.5 MG tablet Take 1 tablet daily except take 2 tablets on Wed or as directed by anticoagulation clinic   No facility-administered encounter medications on file as of 01/04/2020.    Current Diagnosis: Patient Active Problem List   Diagnosis Date Noted  . Long term (current) use of anticoagulants 10/17/2016  . Acute gout 02/05/2016  . Chronic gout 02/05/2016  . Encounter for therapeutic drug monitoring 03/19/2013  . Atrial fibrillation ([place])   . Ejection fraction < 50%   . Warfarin anticoagulation   . CVA (cerebral infarction)   . Overweight(278.02)   . Edema   . Hypertension 05/10/2011    Goals Addressed   None    Reviewed chart prior to disease state call. Spoke with patient regarding BP  Recent Office Vitals: BP Readings from Last 3 Encounters:  04/29/19 138/88  01/19/19 (!) 146/92  01/15/19 (!) 150/92   Pulse Readings from Last 3 Encounters:  04/29/19 (!) 110  01/19/19 76  01/15/19 92    Wt Readings from Last 3  Encounters:  04/29/19 230 lb 4.8 oz (104.5 kg)  01/19/19 237 lb 1.6 oz (107.5 kg)  01/15/19 243 lb (110.2 kg)     Kidney Function Lab Results  Component Value Date/Time   CREATININE 1.13 11/15/2017 09:18 AM   CREATININE 1.35 (H) 12/28/2016 10:00 PM   GFR 67.49 11/15/2017 09:18 AM   GFRNONAA 51 (L) 12/28/2016 10:00 PM   GFRAA 59 (L) 12/28/2016 10:00 PM    BMP Latest Ref Rng & Units 11/15/2017 12/28/2016 12/17/2016  Glucose 70 - 99 mg/dL 100(H) 129(H) -  BUN 6 - 23 mg/dL 24(H) 20 -  Creatinine 0.40 - 1.50 mg/dL 1.13 1.35(H) 1.00  Sodium 135 - 145 mEq/L 142 136 -  Potassium 3.5 - 5.1 mEq/L 4.5 4.3 -  Chloride 96 - 112 mEq/L 106 105 -  CO2 19 - 32 mEq/L 28 21(L) -  Calcium 8.4 - 10.5 mg/dL 9.2 8.8(L) -   . Current antihypertensive regimen:   Diltiazem 180 mg 1 capsule daily   Atenolol 100 mg 1/2 tablet twice daily  Lisinopril 20 mg 1 tablet daily . How often are you checking your Blood Pressure? 3-5x per week . Current home BP readings: These are an average of each time of day. o Morning 130/87  o Afternoon 11/75 o Evening 120/85 o Bedtime 130/90 o Early Morning ( 2 am-7 am) 150-100 . What recent interventions/DTPs have been made  by any provider to improve Blood Pressure control since last CPP Visit: None . Any recent hospitalizations or ED visits since last visit with CPP? No . What diet changes have been made to improve Blood Pressure Control?  o He states that he eats more greens and eats less red meat. . What exercise is being done to improve your Blood Pressure Control?  o He still works some days, and he does yard work   Adherence Review: Is the patient currently on ACE/ARB medication? Yes Does the patient have >5 day gap between last estimated fill dates? No  Follow-Up:  Pharmacist Review  He states he often wakes up in the middle of the night with some ear pain and that's when his blood pressure is elevated. He denies any side effects with his medication and  denies any problems with his current pharmacy.  Maia Breslow, [place] Assistant [phone]

## 2020-01-06 NOTE — Patient Instructions (Addendum)
Hi Jose Bonilla,  It was lovely to get to speak with you over the phone! Below is a summary of most of the things we discussed. I also reached out to Dr. Elease Hashimoto and he wants you to schedule a follow up appointment with him to address some of the things we had talked about.  Please give me a call if you have questions or need anything before our follow up in 3 months!  Best, Maddie  Jeni Salles, PharmD [facility] Clinical Pharmacist [place] [facility] [place]  Visit Information  Goals Addressed            This Visit's Progress   . Pharmacy Care Plan       CARE PLAN ENTRY (see longitudinal plan of care for additional care plan information)  Current Barriers:  . Chronic Disease Management support, education, and care coordination needs related to Hypertension, Atrial Fibrillation, Gout, and arthritis/pain   Hypertension BP Readings from Last 3 Encounters:  04/29/19 138/88  01/19/19 (!) 146/92  01/15/19 (!) 150/92   . Pharmacist Clinical Goal(s): o Over the next 14 days, patient will work with PharmD and providers to achieve BP goal <130/80 . Current regimen:  . Diltiazem 180 mg 1 capsule daily  . Atenolol 100 mg 1/2 tablet twice daily . Lisinopril 20 mg 1 tablet daily . Interventions: o Discussed DASH eating plan recommendations: . Emphasizes vegetables, fruits, and whole-grains . Includes fat-free or low-fat dairy products, fish, poultry, beans, nuts, and vegetable oils . Limits foods that are high in saturated fat. These foods include fatty meats, full-fat dairy products, and tropical oils such as coconut, palm kernel, and palm oils. . Limits sugar-sweetened beverages and sweets . Limiting sodium intake to < 1500 mg/day o Discussed recommendations for moderate aerobic exercise for 150 minutes/week spread out over 5 days for heart healthy lifestyle . Patient self care activities - Over the next 14 days, patient will: o Check blood pressure daily, document,  and provide at future appointments o Ensure daily salt intake < 2300 mg/day  AFib Pulse Readings from Last 3 Encounters:  04/29/19 (!) 110  01/19/19 76  01/15/19 92   . Pharmacist Clinical Goal(s): o Over the next 90 days, patient will work with PharmD and providers to maintain pulse < 110 beats per minute . Current regimen:  . Diltiazem 180 mg 1 capsule daily . Atenolol 100 mg 1/2 tablet twice daily . Warfarin 2.5 mg tablet 5 mg every Wed and Sat and 2.5 mg on all other days  . Interventions: o Discussed monitoring for signs of bleeding such as unexplained and excessive bleeding from a cut or injury, easy or excessive bruising, blood in urine or stools, and nosebleeds without a known cause . Patient self care activities - Over the next 90 days, patient will: o Continue current medications  Gout Lab Results  Component Value Date   LABURIC 7.3 06/20/2016   . Pharmacist Clinical Goal(s): o Over the next 90 days, patient will work with PharmD and providers to achieve uric acid < 6 and avoid recurrent gout symptoms . Current regimen:  o Allopurinol 300 mg 1 tablet daily  . Interventions: o Discussed avoiding/limiting foods with high purine content: . wild game, such as veal, venison, and duck . red meat . some seafood, including tuna, sardines, anchovies, herring, mussels, codfish, scallops, trout, and haddock . organ meat, such as liver, kidneys, and thymus glands, which are known as sweetbreads o Avoiding/limiting foods to allow the body to process  purines more effectively: . High-fat foods: Fat holds uric acid in the kidneys, so a person should avoid fried foods, full-fat dairy products, rich desserts, and other high-fat items. . Alcohol: Beer and whiskey are high in purines, but some research shows that all alcohol consumption can raise uric acid levels. Alcohol also causes dehydration, which hampers the body's ability to flush out uric acid. . Sweetened beverages: Fructose is  an ingredient in many sweetened beverages, including fruit juices and sodas, and consuming too much puts a person at risk for gout. . Patient self care activities - Over the next 90 days, patient will: o Patient will start taking allopurinol consistently every day as prescribed  Arthritis/pain . Pharmacist Clinical Goal(s) o Over the next 90 days, patient will work with PharmD and providers to manage pain . Current regimen:  o Ibuprofen 200 mg 1 tablet three times daily . Interventions: o Discussed the risk of bleeding with using ibuprofen consistently with use of warfarin o Recommended use of Tylenol for pain . Patient self care activities - Over the next 90 days, patient will: o Try to limit use of ibuprofen to avoid risk of bleeding  Medication management . Pharmacist Clinical Goal(s): o Over the next 90 days, patient will work with PharmD and providers to achieve optimal medication adherence . Current pharmacy: [facility] . Interventions o Comprehensive medication review performed. o Continue current medication management strategy . Patient self care activities - Over the next 90 days, patient will: o Take medications as prescribed o Report any questions or concerns to PharmD and/or provider(s)  Initial goal documentation        Jose Bonilla was given information about Chronic Care Management services today including:  1. CCM service includes personalized support from designated clinical staff supervised by his physician, including individualized plan of care and coordination with other care providers 2. 24/7 contact phone numbers for assistance for urgent and routine care needs. 3. Standard insurance, coinsurance, copays and deductibles apply for chronic care management only during months in which we provide at least 20 minutes of these services. Most insurances cover these services at 100%, however patients may be responsible for any copay, coinsurance and/or deductible if  applicable. This service may help you avoid the need for more expensive face-to-face services. 4. Only one practitioner may furnish and bill the service in a calendar month. 5. The patient may stop CCM services at any time (effective at the end of the month) by phone call to the office staff.  Patient agreed to services and verbal consent obtained.   The patient verbalized understanding of instructions, educational materials, and care plan provided today and agreed to receive a mailed copy of patient instructions, educational materials, and care plan.  Telephone follow up appointment with pharmacy team member scheduled for: 3 months  How to Take Your Blood Pressure You can take your blood pressure at home with a machine. You may need to check your blood pressure at home:  To check if you have high blood pressure (hypertension).  To check your blood pressure over time.  To make sure your blood pressure medicine is working. Supplies needed: You will need a blood pressure machine, or monitor. You can buy one at a drugstore or online. When choosing one:  Choose one with an arm cuff.  Choose one that wraps around your upper arm. Only one finger should fit between your arm and the cuff.  Do not choose one that measures your blood pressure from your  wrist or finger. Your doctor can suggest a monitor. How to prepare Avoid these things for 30 minutes before checking your blood pressure:  Drinking caffeine.  Drinking alcohol.  Eating.  Smoking.  Exercising. Five minutes before checking your blood pressure:  Pee.  Sit in a dining chair. Avoid sitting in a soft couch or armchair.  Be quiet. Do not talk. How to take your blood pressure Follow the instructions that came with your machine. If you have a digital blood pressure monitor, these may be the instructions: 1. Sit up straight. 2. Place your feet on the floor. Do not cross your ankles or legs. 3. Rest your left arm at the  level of your heart. You may rest it on a table, desk, or chair. 4. Pull up your shirt sleeve. 5. Wrap the blood pressure cuff around the upper part of your left arm. The cuff should be 1 inch (2.5 cm) above your elbow. It is best to wrap the cuff around bare skin. 6. Fit the cuff snugly around your arm. You should be able to place only one finger between the cuff and your arm. 7. Put the cord inside the groove of your elbow. 8. Press the power button. 9. Sit quietly while the cuff fills with air and loses air. 10. Write down the numbers on the screen. 11. Wait 2-3 minutes and then repeat steps 1-10. What do the numbers mean? Two numbers make up your blood pressure. The first number is called systolic pressure. The second is called diastolic pressure. An example of a blood pressure reading is "120 over 80" (or 120/80). If you are an adult and do not have a medical condition, use this guide to find out if your blood pressure is normal: Normal  First number: below 120.  Second number: below 80. Elevated  First number: 120-129.  Second number: below 80. Hypertension stage 1  First number: 130-139.  Second number: 80-89. Hypertension stage 2  First number: 140 or above.  Second number: 39 or above. Your blood pressure is above normal even if only the top or bottom number is above normal. Follow these instructions at home:  Check your blood pressure as often as your doctor tells you to.  Take your monitor to your next doctor's appointment. Your doctor will: ? Make sure you are using it correctly. ? Make sure it is working right.  Make sure you understand what your blood pressure numbers should be.  Tell your doctor if your medicines are causing side effects. Contact a doctor if:  Your blood pressure keeps being high. Get help right away if:  Your first blood pressure number is higher than 180.  Your second blood pressure number is higher than 120. This information is  not intended to replace advice given to you by your health care provider. Make sure you discuss any questions you have with your health care provider. Document Revised: 01/04/2017 Document Reviewed: 07/01/2015 Elsevier Patient Education  2020 [employer].

## 2020-01-19 ENCOUNTER — Ambulatory Visit (INDEPENDENT_AMBULATORY_CARE_PROVIDER_SITE_OTHER): Payer: Medicare Other | Admitting: Dermatology

## 2020-01-19 ENCOUNTER — Other Ambulatory Visit: Payer: Self-pay

## 2020-01-19 ENCOUNTER — Encounter: Payer: Self-pay | Admitting: Dermatology

## 2020-01-19 DIAGNOSIS — Z1283 Encounter for screening for malignant neoplasm of skin: Secondary | ICD-10-CM

## 2020-01-19 DIAGNOSIS — L570 Actinic keratosis: Secondary | ICD-10-CM

## 2020-01-20 ENCOUNTER — Ambulatory Visit (INDEPENDENT_AMBULATORY_CARE_PROVIDER_SITE_OTHER): Payer: Medicare Other

## 2020-01-20 ENCOUNTER — Other Ambulatory Visit: Payer: Self-pay | Admitting: Cardiology

## 2020-01-20 DIAGNOSIS — I059 Rheumatic mitral valve disease, unspecified: Secondary | ICD-10-CM

## 2020-01-20 LAB — ECHOCARDIOGRAM COMPLETE
Area-P 1/2: 5.42 cm2
Calc EF: 62.5 %
MV M vel: 4.82 m/s
MV Peak grad: 93.1 mmHg
P 1/2 time: 864 msec
Radius: 0.3 cm
S' Lateral: 3.55 cm
Single Plane A2C EF: 67.1 %
Single Plane A4C EF: 59.8 %

## 2020-01-22 ENCOUNTER — Ambulatory Visit (INDEPENDENT_AMBULATORY_CARE_PROVIDER_SITE_OTHER): Payer: Medicare Other | Admitting: Cardiology

## 2020-01-22 ENCOUNTER — Encounter: Payer: Self-pay | Admitting: Cardiology

## 2020-01-22 VITALS — BP 150/86 | HR 84 | Resp 16 | Ht 68.0 in | Wt 228.4 lb

## 2020-01-22 DIAGNOSIS — I4821 Permanent atrial fibrillation: Secondary | ICD-10-CM | POA: Diagnosis not present

## 2020-01-22 DIAGNOSIS — I340 Nonrheumatic mitral (valve) insufficiency: Secondary | ICD-10-CM

## 2020-01-22 NOTE — Addendum Note (Signed)
Addended by: Merlene Laughter on: 01/22/2020 03:41 PM   Modules accepted: Orders

## 2020-01-22 NOTE — Patient Instructions (Addendum)

## 2020-01-22 NOTE — Progress Notes (Signed)
Cardiology Office Note  Date: 01/22/2020   ID: Jose Bonilla, DOB 04-29-1945, MRN [id number]  PCP:  [name], MD  Cardiologist:  Rozann Lesches, MD Electrophysiologist:  None   Chief Complaint  Patient presents with   Cardiac follow-up    History of Present Illness: Jose Bonilla is a 74 y.o. male last seen in December 2020.  He presents for a routine visit.  From a cardiac perspective he does not report any sense of palpitations.  He states that he had a concussion earlier in the year and was hospitalized.  Still working part-time.  He reports NYHA class II dyspnea, no chest pain or syncope.  He continues on Coumadin with follow-up by Dr. Elease Hashimoto.  He does not report any spontaneous bleeding problems.  Recent follow-up echocardiogram revealed LVEF 60 to 65%, normal RV contraction with RVSP estimated 54 mmHg, biatrial enlargement, and only trivial mitral regurgitation.  I reviewed his medications which are outlined below.  I did talk with him about possibly considering low-dose diuretic if blood pressure remains elevated.  I personally reviewed his ECG today which shows rate controlled atrial fibrillation.  Past Medical History:  Diagnosis Date   Chronic atrial fibrillation (HCC)    Coumadin therapy   CVA (cerebral infarction)    Possible history   Essential hypertension    Gout    Overweight(278.02)    Seasonal allergies    Secondary cardiomyopathy ([place])    LVEF 40-45% June 2013    History reviewed. No pertinent surgical history.  Current Outpatient Medications  Medication Sig Dispense Refill   allopurinol (ZYLOPRIM) 300 MG tablet Take 1 tablet (300 mg total) by mouth daily. 90 tablet 0   atenolol (TENORMIN) 100 MG tablet Take 1/2 (one-half) tablet by mouth twice daily 90 tablet 0   diltiazem (CARDIZEM CD) 180 MG 24 hr capsule Take 1 capsule by mouth once daily 90 capsule 1   ibuprofen (ADVIL) 200 MG tablet Take 200 mg by mouth every 6 (six)  hours as needed.     lisinopril (ZESTRIL) 20 MG tablet Take 1 tablet by mouth once daily 90 tablet 0   warfarin (COUMADIN) 2.5 MG tablet Take 1 tablet daily except take 2 tablets on Wed or as directed by anticoagulation clinic 120 tablet 0   No current facility-administered medications for this visit.   Allergies:  Indomethacin   ROS: No syncope.  Physical Exam: VS:  BP (!) 150/86    Pulse 84    Resp 16    Ht 5\' 8"  (1.727 m)    Wt 228 lb 6.4 oz (103.6 kg)    SpO2 99%    BMI 34.73 kg/m , BMI Body mass index is 34.73 kg/m.  Wt Readings from Last 3 Encounters:  01/22/20 228 lb 6.4 oz (103.6 kg)  04/29/19 230 lb 4.8 oz (104.5 kg)  01/19/19 237 lb 1.6 oz (107.5 kg)    General: Patient appears comfortable at rest. HEENT: Conjunctiva and lids normal, wearing a mask. Neck: Supple, no elevated JVP or carotid bruits, no thyromegaly. Lungs: Clear to auscultation, nonlabored breathing at rest. Cardiac: Irregularly irregular, soft systolic murmur, no gallop. Extremities: No pitting edema.  ECG:  An ECG dated 01/15/2019 was personally reviewed today and demonstrated:  Atrial fibrillation with RVR, intermittent PVCs versus aberrantly conducted complexes, nonspecific ST-T changes.  Recent Labwork:  No interval lab work for review today.  Other Studies Reviewed Today:  Echocardiogram 01/20/2020: 1. Left ventricular ejection fraction, by estimation, is 60  to 65%. The  left ventricle has normal function. The left ventricle has no regional  wall motion abnormalities. There is moderate left ventricular hypertrophy.  Left ventricular diastolic  parameters are indeterminate in the setting of atrial fibrillation.  2. Right ventricular systolic function is normal. The right ventricular  size is mildly enlarged. There is moderately elevated pulmonary artery  systolic pressure. The estimated right ventricular systolic pressure is  60.7 mmHg.  3. Left atrial size was moderately dilated.  4.  Right atrial size was severely dilated.  5. The mitral valve is grossly normal. Trivial mitral valve  regurgitation.  6. The aortic valve is tricuspid. Aortic valve regurgitation is mild.  Aortic regurgitation PHT measures 864 msec.  7. The inferior vena cava is normal in size with <50% respiratory  variability, suggesting right atrial pressure of 8 mmHg.   Assessment and Plan:  1.  Permanent atrial fibrillation.  CHA2DS2-VASc score is 5.  He remains on Coumadin for stroke prophylaxis, followed by PCP.  Heart rate is well controlled today on combination of atenolol and Cardizem CD.  ECG reviewed.  2.  History of cardiomyopathy, LVEF normal at 60 to 65% by recent echocardiogram.  Also normal RV contraction with moderately elevated pulmonary pressures.  I did talk with him about possibly considering a diuretic in addition to his standard regimen.  He does not report any swelling or unusual weight gain.  3.  Mitral regurgitation, only trivial by recent echocardiogram.  Medication Adjustments/Labs and Tests Ordered: Current medicines are reviewed at length with the patient today.  Concerns regarding medicines are outlined above.   Tests Ordered: No orders of the defined types were placed in this encounter.   Medication Changes: No orders of the defined types were placed in this encounter.   Disposition:  Follow up 6 months in the [place] office.  Signed, Satira Sark, MD, [facility] 01/22/2020 3:29 PM    [facility], [place], [place] [address] Phone: [phone]; Fax: [phone]

## 2020-01-23 ENCOUNTER — Encounter: Payer: Self-pay | Admitting: Dermatology

## 2020-01-23 NOTE — Progress Notes (Signed)
   Follow-Up Visit   Subjective  Jose Bonilla is a 74 y.o. male who presents for the following: Skin Problem (Patient here today for spot on his left ear rim x months. No bleeding, no pain. Patient also wants to discuss using a topical for the crust, rough spot on his scalp.).  Crusts Location: Face and scalp Duration:  Quality:  Associated Signs/Symptoms: Modifying Factors:  Severity:  Timing: Context:   Objective  Well appearing patient in no apparent distress; mood and affect are within normal limits. Objective  Left Superior Helix, Right Temple: 2 to 5 mm flattened to hornlike crusts  Objective  Mid Parietal Scalp: Back, upper chest, head and neck examined.  No atypical moles, melanoma, or nonmelanoma skin cancer.   All sun exposed areas plus back examined.   Assessment & Plan    AK (actinic keratosis) (2) Left Superior Helix; Right Temple  Although there are a few small actinic keratoses on the scalp, it is asymptomatic and really not severe enough currently to justify either topical fluorouracil or PDT.  May reconsider in the future.  Destruction of lesion - Left Superior Helix, Right Temple Complexity: simple   Destruction method: cryotherapy   Informed consent: discussed and consent obtained   Timeout:  patient name, date of birth, surgical site, and procedure verified Lesion destroyed using liquid nitrogen: Yes   Cryotherapy cycles:  5 Outcome: patient tolerated procedure well with no complications   Post-procedure details: wound care instructions given    Skin exam for malignant neoplasm Mid Parietal Scalp  Annual skin examination.     I, Lavonna Monarch, MD, have reviewed all documentation for this visit.  The documentation on 01/23/20 for the exam, diagnosis, procedures, and orders are all accurate and complete.

## 2020-01-25 ENCOUNTER — Other Ambulatory Visit: Payer: Self-pay

## 2020-01-25 ENCOUNTER — Ambulatory Visit (INDEPENDENT_AMBULATORY_CARE_PROVIDER_SITE_OTHER): Payer: Medicare Other | Admitting: General Practice

## 2020-01-25 DIAGNOSIS — Z7901 Long term (current) use of anticoagulants: Secondary | ICD-10-CM

## 2020-01-25 DIAGNOSIS — I4891 Unspecified atrial fibrillation: Secondary | ICD-10-CM | POA: Diagnosis not present

## 2020-01-25 LAB — POCT INR: INR: 2.2 (ref 2.0–3.0)

## 2020-01-25 NOTE — Patient Instructions (Addendum)
Pre visit review using our clinic review tool, if applicable. No additional management support is needed unless otherwise documented below in the visit note.  Continue to take 1 tablet daily except 2 tablets on Wednesdays and Saturdays.  Re-check in 4 weeks.

## 2020-02-16 ENCOUNTER — Ambulatory Visit: Payer: Medicare Other

## 2020-02-22 ENCOUNTER — Ambulatory Visit: Payer: Medicare Other

## 2020-02-23 ENCOUNTER — Other Ambulatory Visit: Payer: Self-pay | Admitting: Cardiology

## 2020-02-29 ENCOUNTER — Ambulatory Visit: Payer: Medicare Other

## 2020-03-09 ENCOUNTER — Ambulatory Visit: Payer: Medicare Other

## 2020-03-18 ENCOUNTER — Ambulatory Visit (INDEPENDENT_AMBULATORY_CARE_PROVIDER_SITE_OTHER): Payer: Medicare Other | Admitting: Pharmacist

## 2020-03-18 DIAGNOSIS — I10 Essential (primary) hypertension: Secondary | ICD-10-CM | POA: Diagnosis not present

## 2020-03-18 DIAGNOSIS — I4891 Unspecified atrial fibrillation: Secondary | ICD-10-CM | POA: Diagnosis not present

## 2020-03-18 NOTE — Patient Instructions (Addendum)
Hi Jose Bonilla!  It was great getting to speak with you again today. As we discussed, I think it would be beneficial for you to move lisinopril to be taken in the evening to see if this lowers your morning blood pressure numbers. My assistant will be reaching out to you soon to check back in on those numbers to see if that makes a difference.  Best, Maddie  Jose Bonilla, PharmD [facility] Clinical Pharmacist [place] at [place]   Visit Information  Goals Addressed   None    Patient Care Plan: CCM Pharmacy Care Plan    Problem Identified: Problem: Hypertension, Hyperlipidemia, Atrial Fibrillation, Gout and arthritis     Long-Range Goal: Patient-Specific Goal   Start Date: 03/18/2020  Expected End Date: 03/18/2021  This Visit's Progress: On track  Priority: High  Note:   Current Barriers:  . Unable to independently monitor therapeutic efficacy . Unable to achieve control of blood pressure   Pharmacist Clinical Goal(s):  Marland Kitchen Over the next 90 days, patient will achieve control of blood pressure as evidenced by home blood pressure monitoring  through collaboration with PharmD and provider.   Interventions: . 1:1 collaboration with Jose Post, MD regarding development and update of comprehensive plan of care as evidenced by provider attestation and co-signature . Inter-disciplinary care team collaboration (see longitudinal plan of care) . Comprehensive medication review performed; medication list updated in electronic medical record  Hypertension (BP goal <130/80) -Uncontrolled -Current treatment:  Diltiazem 180 mg 1 capsule daily   Atenolol 100 mg 1/2 tablet twice daily  Lisinopril 20 mg 1 tablet daily -Medications previously tried: lisinopril-HCTZ (unknown) -Current home readings: 150-160/120 in early morning, 117/74 during the day -Current dietary habits: eating more fruit and vegetables lately; has been making blueberry spread  which has curbed  his appetite -Current exercise habits: patient is not regularly exercising -Denies hypotensive/hypertensive symptoms -Educated on Exercise goal of 150 minutes per week; Proper BP monitoring technique; -Counseled to monitor BP at home twice weekly, document, and provide log at future appointments -Recommended moving lisinopril to the evening to help with morning BP lowering   Hyperlipidemia: (LDL goal < 70) -Uncontrolled -Current treatment: . No medications -Medications previously tried: none  -Current dietary patterns: eating more fruit and vegetables lately; has been making blueberry spread  which has curbed his appetite -Current exercise habits: patient is not exercising regularly -Educated on Benefits of statin for ASCVD risk reduction; -Recommended repeat lipid panel  Atrial Fibrillation (Goal: prevent stroke and major bleeding) -Controlled -CHADSVASC: 5 -Current treatment: . Rate control: diltiazem 180 mg 1 capsule daily . Anticoagulation: warfarin 5 mg every Wed and Sat and 2.5 mg on all other days -Medications previously tried: none -Home BP and HR readings: 150-160/120 in early morning, 117/74 during the day -Counseled on bleeding risk associated with Eliquis and importance of self-monitoring for signs/symptoms of bleeding; avoidance of NSAIDs due to increased bleeding risk with anticoagulants; -Recommended to continue current medication  Gout (Goal: uric acid < 6 and prevent gout flare ups) -Controlled -Current treatment  . Allopurinol 300 mg 1 tablet daily -Medications previously tried: none  -Recommended to continue current medication Recommended repeat uric acid level  Arthritis (Goal: minimize pain in hand) -Controlled -Current treatment  . Ibuprofen 200 mg 1 tablet three times daily  -Medications previously tried: Voltaren gel (ineffective) -Recommended preference of Tylenol over NSAIDs for pain   Health Maintenance -Vaccine gaps: Shingles, tetanus,  pneumonia and influenza -Current therapy:  . None -Educated  on  continuing as is -Patient is satisfied with current therapy and denies issues -Recommended to continue without supplementation  Patient Goals/Self-Care Activities . Over the next 90 days, patient will:  - check blood pressure twice weekly, document, and provide at future appointments  Follow Up Plan: Telephone follow up appointment with care management team member scheduled for: 3 months       Patient verbalizes understanding of instructions provided today and agrees to view in [place].  Telephone follow up appointment with pharmacy team member scheduled for:3 months  Viona Gilmore, [facility]

## 2020-03-18 NOTE — Progress Notes (Signed)
Chronic Care Management Pharmacy Note  03/30/2020 Name:  Jose Bonilla MRN:  [id number] DOB:  04-15-1945  Subjective: Jose Bonilla is an 75 y.o. year old male who is a primary patient of Burchette, Alinda Sierras, MD.  The CCM team was consulted for assistance with disease management and care coordination needs.    Engaged with patient by telephone for follow up visit in response to provider referral for pharmacy case management and/or care coordination services.   Consent to Services:  The patient was given information about Chronic Care Management services, agreed to services, and gave verbal consent prior to initiation of services.  Please see initial visit note for detailed documentation.   Patient Care Team: [name], MD as PCP - General (Family Medicine) Satira Sark, MD as PCP - Cardiology (Cardiology) Viona Gilmore, [facility] as Pharmacist (Pharmacist)  Recent office visits: 01/25/20 Meriam Sprague, RN: Patient presented for anti-coag visit. INR 2.2, goal 2-3. Continued warfarin 5 mg every Wed and Sat and 2.5 mg on all other days.  12/18/19 Ofilia Neas, LPN: Patient presented for medicare annual wellness visit.  -04/29/19 Carolann Littler, MD: Patient presented for concussion evaluation. Patient reported loss of consciousness for 20-30 seconds and has some mild dizziness. Initial heart rate was elevated.   -04/28/19 Carolann Littler, MD: Patient presented for telephone visit for concussion evaluation. Patient had a fall and head injury on Saturday and reports feeling sore all over. Recommended in office visit tomorrow.  Recent consult visits: 01/22/20 Rozann Lesches, MD (cardiology): Patient presented for Afib follow up. BP elevated in office.  01/19/20 Lavonna Monarch, MD (dermatology): Patient presented for actinic keratosis follow up.   Hospital visits: None in previous 6 months  Objective:  Lab Results  Component Value Date   CREATININE 1.13 11/15/2017   BUN 24  (H) 11/15/2017   GFR 67.49 11/15/2017   GFRNONAA 51 (L) 12/28/2016   GFRAA 59 (L) 12/28/2016   NA 142 11/15/2017   K 4.5 11/15/2017   CALCIUM 9.2 11/15/2017   CO2 28 11/15/2017    Lab Results  Component Value Date/Time   GFR 67.49 11/15/2017 09:18 AM   GFR 76.26 06/20/2016 02:29 PM    Last diabetic Eye exam: No results found for: HMDIABEYEEXA  Last diabetic Foot exam: No results found for: HMDIABFOOTEX   Lab Results  Component Value Date   CHOL 151 08/16/2014   HDL 27.50 (L) 08/16/2014   LDLDIRECT 67.0 08/16/2014   TRIG 440.0 (H) 08/16/2014   CHOLHDL 5 08/16/2014    Hepatic Function Latest Ref Rng & Units 06/20/2016 08/16/2014  Total Protein 6.0 - 8.3 g/dL 6.5 6.8  Albumin 3.5 - 5.2 g/dL 4.2 4.0  AST 0 - 37 U/L 20 23  ALT 0 - 53 U/L 16 18  Alk Phosphatase 39 - 117 U/L 78 75  Total Bilirubin 0.2 - 1.2 mg/dL 0.9 0.8  Bilirubin, Direct 0.0 - 0.3 mg/dL 0.2 0.1    Lab Results  Component Value Date/Time   TSH 1.80 05/10/2011 01:02 PM    CBC Latest Ref Rng & Units 12/28/2016 06/20/2016 11/18/2014  WBC 4.0 - 10.5 K/uL 6.5 5.0 6.6  Hemoglobin 13.0 - 17.0 g/dL 13.4 14.0 12.7(L)  Hematocrit 39.0 - 52.0 % 43.8 43.2 39.3  Platelets 150 - 400 K/uL 149(L) 162.0 194.0    No results found for: VD25OH  Clinical ASCVD: Yes  The ASCVD Risk score Mikey Bussing DC Jr., et al., 2013) failed to calculate for the following reasons:  Cannot find a previous HDL lab   Cannot find a previous total cholesterol lab    Depression screen [facility] 2/9 12/18/2019 11/15/2017 03/14/2016  Decreased Interest 0 0 0  Down, Depressed, Hopeless 0 0 0  PHQ - 2 Score 0 0 0  Altered sleeping 0 - -  Tired, decreased energy 0 - -  Change in appetite 0 - -  Feeling bad or failure about yourself  0 - -  Trouble concentrating 0 - -  Moving slowly or fidgety/restless 0 - -  Suicidal thoughts 0 - -  PHQ-9 Score 0 - -  Difficult doing work/chores Not difficult at all - -    CHA2DS2/VAS Stroke Risk Points  Current as  of 5 days ago     3 >= 2 Points: High Risk  1 - 1.99 Points: Medium Risk  0 Points: Low Risk    No Change      Details    This score determines the patient's risk of having a stroke if the  patient has atrial fibrillation.       Points Metrics  0 Has Congestive Heart Failure:  No    Current as of 5 days ago  0 Has Vascular Disease:  No    Current as of 5 days ago  1 Has Hypertension:  Yes    Current as of 5 days ago  2 Age:  2    Current as of 5 days ago  0 Has Diabetes:  No    Current as of 5 days ago  2 Had Stroke:  No  Had TIA:  Yes  Had Thromboembolism:  No    Current as of 5 days ago  0 Male:  No    Current as of 5 days ago     Social History   Tobacco Use  Smoking Status Never Smoker  Smokeless Tobacco Never Used   BP Readings from Last 3 Encounters:  01/22/20 (!) 150/86  04/29/19 138/88  01/19/19 (!) 146/92   Pulse Readings from Last 3 Encounters:  01/22/20 84  04/29/19 (!) 110  01/19/19 76   Wt Readings from Last 3 Encounters:  01/22/20 228 lb 6.4 oz (103.6 kg)  04/29/19 230 lb 4.8 oz (104.5 kg)  01/19/19 237 lb 1.6 oz (107.5 kg)    Assessment/Interventions: Review of patient past medical history, allergies, medications, health status, including review of consultants reports, laboratory and other test data, was performed as part of comprehensive evaluation and provision of chronic care management services.   SDOH:  (Social Determinants of Health) assessments and interventions performed: No   CCM Care Plan  Allergies  Allergen Reactions  . Indomethacin Other (See Comments)    Increased heart rate and elevated BP per patient    Medications Reviewed Today    Reviewed by Lavonna Monarch, MD (Physician) on 01/23/20 at [phone]  Med List Status: <None>  Medication Order Taking? Sig Documenting Provider Last Dose Status Informant  allopurinol (ZYLOPRIM) 300 MG tablet [id number] Yes Take 1 tablet (300 mg total) by mouth daily. [name], MD  Taking Active   atenolol (TENORMIN) 100 MG tablet [id number] Yes Take 1/2 (one-half) tablet by mouth twice daily Burchette, Alinda Sierras, MD Taking Active   diltiazem (CARDIZEM CD) 180 MG 24 hr capsule [id number] Yes Take 1 capsule by mouth once daily Satira Sark, MD Taking Active   ibuprofen (ADVIL) 200 MG tablet [id number] Yes Take 200 mg by mouth every 6 (six) hours as needed. [provider] Taking Active   lisinopril (ZESTRIL) 20 MG tablet [id number] Yes Take 1 tablet by mouth once daily Burchette, Alinda Sierras, MD Taking Active   warfarin (COUMADIN) 2.5 MG tablet [id number] Yes Take 1 tablet daily except take 2 tablets on Wed or as directed by anticoagulation clinic [name], MD Taking Active           Patient Active Problem List   Diagnosis Date Noted  . Long term (current) use of anticoagulants 10/17/2016  . Acute gout 02/05/2016  . Chronic gout 02/05/2016  . Encounter for therapeutic drug monitoring 03/19/2013  . Atrial fibrillation ([place])   . Ejection fraction < 50%   . Warfarin anticoagulation   . CVA (cerebral infarction)   . Overweight(278.02)   . Edema   . Hypertension 05/10/2011    Immunization History  Administered Date(s) Administered  . Moderna SARS-COV2 Booster Vaccination 02/02/2020  . Moderna Sars-Covid-2 Vaccination 03/21/2019, 04/18/2019    Conditions to be addressed/monitored:  Hypertension, Hyperlipidemia, Atrial Fibrillation, Gout and arthritis  Care Plan : [place]  Updates made by Viona Gilmore, [place] since 03/30/2020 12:00 AM    Problem: Problem: Hypertension, Hyperlipidemia, Atrial Fibrillation, Gout and arthritis     Long-Range Goal: Patient-Specific Goal   Start Date: 03/18/2020  Expected End Date: 03/18/2021  This Visit's Progress: On track  Priority: High  Note:   Current Barriers:  . Unable to independently monitor therapeutic efficacy . Unable to achieve control of blood pressure   Pharmacist Clinical  Goal(s):  Marland Kitchen Over the next 90 days, patient will achieve control of blood pressure as evidenced by home blood pressure monitoring  through collaboration with PharmD and provider.   Interventions: . 1:1 collaboration with [name], MD regarding development and update of comprehensive plan of care as evidenced by provider attestation and co-signature . Inter-disciplinary care team collaboration (see longitudinal plan of care) . Comprehensive medication review performed; medication list updated in electronic medical record  Hypertension (BP goal <130/80) -Uncontrolled -Current treatment:  Diltiazem 180 mg 1 capsule daily   Atenolol 100 mg 1/2 tablet twice daily  Lisinopril 20 mg 1 tablet daily -Medications previously tried: lisinopril-HCTZ (unknown) -Current home readings: 150-160/120 in early morning, 117/74 during the day -Current dietary habits: eating more fruit and vegetables lately; has been making blueberry spread  which has curbed his appetite -Current exercise habits: patient is not regularly exercising -Denies hypotensive/hypertensive symptoms -Educated on Exercise goal of 150 minutes per week; Proper BP monitoring technique; -Counseled to monitor BP at home twice weekly, document, and provide log at future appointments -Recommended moving lisinopril to the evening to help with morning BP lowering   Hyperlipidemia: (LDL goal < 70) -Uncontrolled -Current treatment: . No medications -Medications previously tried: none  -Current dietary patterns: eating more fruit and vegetables lately; has been making blueberry spread  which has curbed his appetite -Current exercise habits: patient is not exercising regularly -Educated on Benefits of statin for ASCVD risk reduction; -Recommended repeat lipid panel  Atrial Fibrillation (Goal: prevent stroke and major bleeding) -Controlled -CHADSVASC: 5 -Current treatment: . Rate control: diltiazem 180 mg 1 capsule  daily . Anticoagulation: warfarin 5 mg every Wed and Sat and 2.5 mg on all other days -Medications previously tried: none -Home BP and HR readings: 150-160/120 in early morning, 117/74 during the day -Counseled on bleeding risk associated with Eliquis and importance of self-monitoring for signs/symptoms of bleeding; avoidance of NSAIDs due to increased bleeding risk with anticoagulants; -  Recommended to continue current medication  Gout (Goal: uric acid < 6 and prevent gout flare ups) -Controlled -Current treatment  . Allopurinol 300 mg 1 tablet daily -Medications previously tried: none  -Recommended to continue current medication Recommended repeat uric acid level  Arthritis (Goal: minimize pain in hand) -Controlled -Current treatment  . Ibuprofen 200 mg 1 tablet three times daily  -Medications previously tried: Voltaren gel (ineffective) -Recommended preference of Tylenol over NSAIDs for pain   Health Maintenance -Vaccine gaps: Shingles, tetanus, pneumonia and influenza -Current therapy:  . None -Educated on  continuing as is -Patient is satisfied with current therapy and denies issues -Recommended to continue without supplementation  Patient Goals/Self-Care Activities . Over the next 90 days, patient will:  - check blood pressure twice weekly, document, and provide at future appointments  Follow Up Plan: Telephone follow up appointment with care management team member scheduled for: 3 months      Medication Assistance: None required.  Patient affirms current coverage meets needs.  Patient's preferred pharmacy is:  [facility] [address], [place] [address] Phone: [phone] Fax: [facility] [address], [place] - [place] [address] [place] [address] Phone: [phone] Fax: [phone]  Uses pill box? Yes Pt endorses 90% compliance  We discussed: Current pharmacy is  preferred with insurance plan and patient is satisfied with pharmacy services Patient decided to: Continue current medication management strategy  Care Plan and Follow Up Patient Decision:  Patient agrees to Care Plan and Follow-up.  Plan: Telephone follow up appointment with care management team member scheduled for:  3 months  Jeni Salles, PharmD [place] Pharmacist [place] at [place] [phone]

## 2020-03-19 ENCOUNTER — Other Ambulatory Visit: Payer: Self-pay | Admitting: Family Medicine

## 2020-03-19 DIAGNOSIS — Z7901 Long term (current) use of anticoagulants: Secondary | ICD-10-CM

## 2020-03-30 ENCOUNTER — Ambulatory Visit (INDEPENDENT_AMBULATORY_CARE_PROVIDER_SITE_OTHER): Payer: Medicare Other | Admitting: General Practice

## 2020-03-30 ENCOUNTER — Other Ambulatory Visit: Payer: Self-pay

## 2020-03-30 DIAGNOSIS — I4891 Unspecified atrial fibrillation: Secondary | ICD-10-CM | POA: Diagnosis not present

## 2020-03-30 DIAGNOSIS — Z7901 Long term (current) use of anticoagulants: Secondary | ICD-10-CM

## 2020-03-30 LAB — POCT INR: INR: 2.9 (ref 2.0–3.0)

## 2020-03-30 NOTE — Patient Instructions (Signed)
Pre visit review using our clinic review tool, if applicable. No additional management support is needed unless otherwise documented below in the visit note. 

## 2020-04-14 ENCOUNTER — Telehealth: Payer: Self-pay | Admitting: Pharmacist

## 2020-04-14 NOTE — Chronic Care Management (AMB) (Signed)
Chronic Care Management Pharmacy Assistant   Name: Jose Bonilla  MRN: [id number] DOB: [birth year]  Reason for Encounter: Disease State   Conditions to be addressed/monitored: HTN  Recent office visits:  None  Recent consult visits:  None  Hospital visits:  None in previous 6 months  Medications: Outpatient Encounter Medications as of 04/14/2020  Medication Sig  . allopurinol (ZYLOPRIM) 300 MG tablet Take 1 tablet (300 mg total) by mouth daily.  Marland Kitchen atenolol (TENORMIN) 100 MG tablet Take 1/2 (one-half) tablet by mouth twice daily  . diltiazem (CARDIZEM CD) 180 MG 24 hr capsule Take 1 capsule by mouth once daily  . ibuprofen (ADVIL) 200 MG tablet Take 200 mg by mouth every 6 (six) hours as needed.  Marland Kitchen lisinopril (ZESTRIL) 20 MG tablet Take 1 tablet by mouth once daily  . warfarin (COUMADIN) 2.5 MG tablet TAKE 1 TABLET BY MOUTH ONCE DAILY EXCEPT  TAKE  2  TABLETS  ON  WEDNESDAYS  OR  AS  DIRECTED  BY  ANTICOAGULATION  CLINIC   No facility-administered encounter medications on file as of 04/14/2020.   Reviewed chart prior to disease state call. Spoke with patient regarding BP  Recent Office Vitals: BP Readings from Last 3 Encounters:  01/22/20 (!) 150/86  04/29/19 138/88  01/19/19 (!) 146/92   Pulse Readings from Last 3 Encounters:  01/22/20 84  04/29/19 (!) 110  01/19/19 76    Wt Readings from Last 3 Encounters:  01/22/20 228 lb 6.4 oz (103.6 kg)  04/29/19 230 lb 4.8 oz (104.5 kg)  01/19/19 237 lb 1.6 oz (107.5 kg)     Kidney Function Lab Results  Component Value Date/Time   CREATININE 1.13 11/15/2017 09:18 AM   CREATININE 1.35 (H) 12/28/2016 10:00 PM   GFR 67.49 11/15/2017 09:18 AM   GFRNONAA 51 (L) 12/28/2016 10:00 PM   GFRAA 59 (L) 12/28/2016 10:00 PM    BMP Latest Ref Rng & Units 11/15/2017 12/28/2016 12/17/2016  Glucose 70 - 99 mg/dL 100(H) 129(H) -  BUN 6 - 23 mg/dL 24(H) 20 -  Creatinine 0.40 - 1.50 mg/dL 1.13 1.35(H) 1.00  Sodium 135 - 145 mEq/L 142  136 -  Potassium 3.5 - 5.1 mEq/L 4.5 4.3 -  Chloride 96 - 112 mEq/L 106 105 -  CO2 19 - 32 mEq/L 28 21(L) -  Calcium 8.4 - 10.5 mg/dL 9.2 8.8(L) -    . Current antihypertensive regimen:   Diltiazem 180 mg 1 capsule daily   Atenolol 100 mg 1/2 tablet twice daily  Lisinopril 20 mg 1 tablet daily . How often are you checking your Blood Pressure? 3-5x per week . Current home BP readings:  o 03.01 140/88 o 03.02 138/88 o 03.04 135/76 o 03.07 142/82 . What recent interventions/DTPs have been made by any provider to improve Blood Pressure control since last CPP Visit: Lisinopril 20 mg changed to bedtime . Any recent hospitalizations or ED visits since last visit with CPP? No . What diet changes have been made to improve Blood Pressure Control?  o None . What exercise is being done to improve your Blood Pressure Control?  o Deep breathing  exercise  Adherence Review: Is the patient currently on ACE/ARB medication? Yes Does the patient have >5 day gap between last estimated fill dates? No  Star Rating Drugs:  Dispensed Day Supply Quantity  Lisinopril 20 mg 11.16.2021 90 90 each  Atenolol 100 mg 12.28.2021 90 90 each  Diltiazem Er 180 mg 01.18.2022 90 90  each    Maia Breslow, [place] Assistant [phone]

## 2020-04-15 ENCOUNTER — Other Ambulatory Visit: Payer: Self-pay | Admitting: Family Medicine

## 2020-04-15 DIAGNOSIS — I10 Essential (primary) hypertension: Secondary | ICD-10-CM

## 2020-04-15 DIAGNOSIS — Z7901 Long term (current) use of anticoagulants: Secondary | ICD-10-CM

## 2020-04-15 DIAGNOSIS — M1A00X0 Idiopathic chronic gout, unspecified site, without tophus (tophi): Secondary | ICD-10-CM

## 2020-04-18 ENCOUNTER — Other Ambulatory Visit: Payer: Self-pay | Admitting: Family Medicine

## 2020-04-18 ENCOUNTER — Other Ambulatory Visit: Payer: Self-pay | Admitting: Cardiology

## 2020-05-11 ENCOUNTER — Ambulatory Visit: Payer: Medicare Other

## 2020-05-17 ENCOUNTER — Other Ambulatory Visit: Payer: Self-pay

## 2020-05-18 ENCOUNTER — Ambulatory Visit (INDEPENDENT_AMBULATORY_CARE_PROVIDER_SITE_OTHER): Payer: Medicare Other | Admitting: General Practice

## 2020-05-18 DIAGNOSIS — I4891 Unspecified atrial fibrillation: Secondary | ICD-10-CM | POA: Diagnosis not present

## 2020-05-18 DIAGNOSIS — Z7901 Long term (current) use of anticoagulants: Secondary | ICD-10-CM

## 2020-05-18 LAB — POCT INR: INR: 1.8 — AB (ref 2.0–3.0)

## 2020-05-18 NOTE — Patient Instructions (Addendum)
Pre visit review using our clinic review tool, if applicable. No additional management support is needed unless otherwise documented below in the visit note.  Continue to take 1 tablet daily except 2 tablets on Wednesdays and Saturdays.  Re-check in 6 weeks.

## 2020-06-01 ENCOUNTER — Other Ambulatory Visit: Payer: Self-pay | Admitting: Family Medicine

## 2020-06-01 DIAGNOSIS — Z7901 Long term (current) use of anticoagulants: Secondary | ICD-10-CM

## 2020-06-14 ENCOUNTER — Telehealth: Payer: Self-pay | Admitting: Pharmacist

## 2020-06-14 NOTE — Chronic Care Management (AMB) (Signed)
    Chronic Care Management Pharmacy Assistant   Name: Jose Bonilla  MRN: [id number] DOB: 11-10-1945  Left voicemail reminding patient of appointment on 06/15/20 at 12pm by phone with Jeni Salles clinical pharmacist. Advised patient to have all medications and any blood pressure or blood glucose reading available.   [place] [phone]

## 2020-06-15 ENCOUNTER — Telehealth: Payer: Medicare Other

## 2020-06-15 NOTE — Progress Notes (Deleted)
Chronic Care Management Pharmacy Note  06/15/2020 Name:  Jose Bonilla MRN:  [id number] DOB:  [birth year]  Subjective: Jose Bonilla is an 75 y.o. year old male who is a primary patient of Burchette, Alinda Sierras, MD.  The CCM team was consulted for assistance with disease management and care coordination needs.    Engaged with patient by telephone for follow up visit in response to provider referral for pharmacy case management and/or care coordination services.   Consent to Services:  The patient was given information about Chronic Care Management services, agreed to services, and gave verbal consent prior to initiation of services.  Please see initial visit note for detailed documentation.   Patient Care Team: [name], MD as PCP - General (Family Medicine) Satira Sark, MD as PCP - Cardiology (Cardiology) Viona Gilmore, [facility] as Pharmacist (Pharmacist)  Recent office visits: 05/18/20 Meriam Sprague, RN: Patient presented for anti-coag visit. INR 1.8, goal 2-3. Boosted dose x 1 day. Continued warfarin 5 mg every Wed and Sat and 2.5 mg on all other days. Recheck in 6 weeks.  01/25/20 Meriam Sprague, RN: Patient presented for anti-coag visit. INR 2.2, goal 2-3. Continued warfarin 5 mg every Wed and Sat and 2.5 mg on all other days.  12/18/19 Ofilia Neas, LPN: Patient presented for medicare annual wellness visit.  -04/29/19 Carolann Littler, MD: Patient presented for concussion evaluation. Patient reported loss of consciousness for 20-30 seconds and has some mild dizziness. Initial heart rate was elevated.   -04/28/19 Carolann Littler, MD: Patient presented for telephone visit for concussion evaluation. Patient had a fall and head injury on Saturday and reports feeling sore all over. Recommended in office visit tomorrow.  Recent consult visits: 01/22/20 Rozann Lesches, MD (cardiology): Patient presented for Afib follow up. BP elevated in office.  01/19/20 Lavonna Monarch, MD  (dermatology): Patient presented for actinic keratosis follow up.   Hospital visits: None in previous 6 months  Objective:  Lab Results  Component Value Date   CREATININE 1.13 11/15/2017   BUN 24 (H) 11/15/2017   GFR 67.49 11/15/2017   GFRNONAA 51 (L) 12/28/2016   GFRAA 59 (L) 12/28/2016   NA 142 11/15/2017   K 4.5 11/15/2017   CALCIUM 9.2 11/15/2017   CO2 28 11/15/2017    Lab Results  Component Value Date/Time   GFR 67.49 11/15/2017 09:18 AM   GFR 76.26 06/20/2016 02:29 PM    Last diabetic Eye exam: No results found for: HMDIABEYEEXA  Last diabetic Foot exam: No results found for: HMDIABFOOTEX   Lab Results  Component Value Date   CHOL 151 08/16/2014   HDL 27.50 (L) 08/16/2014   LDLDIRECT 67.0 08/16/2014   TRIG 440.0 (H) 08/16/2014   CHOLHDL 5 08/16/2014    Hepatic Function Latest Ref Rng & Units 06/20/2016 08/16/2014  Total Protein 6.0 - 8.3 g/dL 6.5 6.8  Albumin 3.5 - 5.2 g/dL 4.2 4.0  AST 0 - 37 U/L 20 23  ALT 0 - 53 U/L 16 18  Alk Phosphatase 39 - 117 U/L 78 75  Total Bilirubin 0.2 - 1.2 mg/dL 0.9 0.8  Bilirubin, Direct 0.0 - 0.3 mg/dL 0.2 0.1    Lab Results  Component Value Date/Time   TSH 1.80 05/10/2011 01:02 PM    CBC Latest Ref Rng & Units 12/28/2016 06/20/2016 11/18/2014  WBC 4.0 - 10.5 K/uL 6.5 5.0 6.6  Hemoglobin 13.0 - 17.0 g/dL 13.4 14.0 12.7(L)  Hematocrit 39.0 - 52.0 % 43.8 43.2 39.3  Platelets 150 -  400 K/uL 149(L) 162.0 194.0    No results found for: VD25OH  Clinical ASCVD: Yes  The ASCVD Risk score Mikey Bussing DC Jr., et al., 2013) failed to calculate for the following reasons:   Cannot find a previous HDL lab   Cannot find a previous total cholesterol lab    Depression screen [facility] 2/9 12/18/2019 11/15/2017 03/14/2016  Decreased Interest 0 0 0  Down, Depressed, Hopeless 0 0 0  PHQ - 2 Score 0 0 0  Altered sleeping 0 - -  Tired, decreased energy 0 - -  Change in appetite 0 - -  Feeling bad or failure about yourself  0 - -  Trouble  concentrating 0 - -  Moving slowly or fidgety/restless 0 - -  Suicidal thoughts 0 - -  PHQ-9 Score 0 - -  Difficult doing work/chores Not difficult at all - -    CHA2DS2/VAS Stroke Risk Points  Current as of 5 days ago     3 >= 2 Points: High Risk  1 - 1.99 Points: Medium Risk  0 Points: Low Risk    No Change      Details    This score determines the patient's risk of having a stroke if the  patient has atrial fibrillation.       Points Metrics  0 Has Congestive Heart Failure:  No    Current as of 5 days ago  0 Has Vascular Disease:  No    Current as of 5 days ago  1 Has Hypertension:  Yes    Current as of 5 days ago  2 Age:  2    Current as of 5 days ago  0 Has Diabetes:  No    Current as of 5 days ago  2 Had Stroke:  No  Had TIA:  Yes  Had Thromboembolism:  No    Current as of 5 days ago  0 Male:  No    Current as of 5 days ago     Social History   Tobacco Use  Smoking Status Never Smoker  Smokeless Tobacco Never Used   BP Readings from Last 3 Encounters:  01/22/20 (!) 150/86  04/29/19 138/88  01/19/19 (!) 146/92   Pulse Readings from Last 3 Encounters:  01/22/20 84  04/29/19 (!) 110  01/19/19 76   Wt Readings from Last 3 Encounters:  01/22/20 228 lb 6.4 oz (103.6 kg)  04/29/19 230 lb 4.8 oz (104.5 kg)  01/19/19 237 lb 1.6 oz (107.5 kg)    Assessment/Interventions: Review of patient past medical history, allergies, medications, health status, including review of consultants reports, laboratory and other test data, was performed as part of comprehensive evaluation and provision of chronic care management services.   SDOH:  (Social Determinants of Health) assessments and interventions performed: No   CCM Care Plan  Allergies  Allergen Reactions  . Indomethacin Other (See Comments)    Increased heart rate and elevated BP per patient    Medications Reviewed Today    Reviewed by Lavonna Monarch, MD (Physician) on 01/23/20 at [phone]  Med List Status:  <None>  Medication Order Taking? Sig Documenting Provider Last Dose Status Informant  allopurinol (ZYLOPRIM) 300 MG tablet [id number] Yes Take 1 tablet (300 mg total) by mouth daily. [name], MD Taking Active   atenolol (TENORMIN) 100 MG tablet [id number] Yes Take 1/2 (one-half) tablet by mouth twice daily Burchette, Alinda Sierras, MD Taking Active   diltiazem (CARDIZEM CD) 180 MG 24 hr  capsule [id number] Yes Take 1 capsule by mouth once daily Satira Sark, MD Taking Active   ibuprofen (ADVIL) 200 MG tablet [id number] Yes Take 200 mg by mouth every 6 (six) hours as needed. [provider] Taking Active   lisinopril (ZESTRIL) 20 MG tablet [id number] Yes Take 1 tablet by mouth once daily Burchette, Alinda Sierras, MD Taking Active   warfarin (COUMADIN) 2.5 MG tablet [id number] Yes Take 1 tablet daily except take 2 tablets on Wed or as directed by anticoagulation clinic [name], MD Taking Active           Patient Active Problem List   Diagnosis Date Noted  . Long term (current) use of anticoagulants 10/17/2016  . Acute gout 02/05/2016  . Chronic gout 02/05/2016  . Encounter for therapeutic drug monitoring 03/19/2013  . Atrial fibrillation ([place])   . Ejection fraction < 50%   . Warfarin anticoagulation   . CVA (cerebral infarction)   . Overweight(278.02)   . Edema   . Hypertension 05/10/2011    Immunization History  Administered Date(s) Administered  . Moderna SARS-COV2 Booster Vaccination 02/02/2020  . Moderna Sars-Covid-2 Vaccination 03/21/2019, 04/18/2019   Lab Results  Component Value Date   CHOL 151 08/16/2014   HDL 27.50 (L) 08/16/2014   LDLDIRECT 67.0 08/16/2014   TRIG 440.0 (H) 08/16/2014   CHOLHDL 5 08/16/2014    Conditions to be addressed/monitored:  Hypertension, Hyperlipidemia, Atrial Fibrillation, Gout and arthritis  There are no care plans that you recently modified to display for this patient.  Patient Care Plan: CCM Pharmacy Care Plan     Problem Identified: Problem: Hypertension, Hyperlipidemia, Atrial Fibrillation, Gout and arthritis     Long-Range Goal: Patient-Specific Goal   Start Date: 03/18/2020  Expected End Date: 03/18/2021  This Visit's Progress: On track  Priority: High  Note:   Current Barriers:  . Unable to independently monitor therapeutic efficacy . Unable to achieve control of blood pressure   Pharmacist Clinical Goal(s):  Marland Kitchen Over the next 90 days, patient will achieve control of blood pressure as evidenced by home blood pressure monitoring  through collaboration with PharmD and provider.   Interventions: . 1:1 collaboration with [name], MD regarding development and update of comprehensive plan of care as evidenced by provider attestation and co-signature . Inter-disciplinary care team collaboration (see longitudinal plan of care) . Comprehensive medication review performed; medication list updated in electronic medical record  Hypertension (BP goal <130/80) -Uncontrolled -Current treatment:  Diltiazem 180 mg 1 capsule daily   Atenolol 100 mg 1/2 tablet twice daily  Lisinopril 20 mg 1 tablet daily -Medications previously tried: lisinopril-HCTZ (unknown) -Current home readings: 150-160/120 in early morning, 117/74 during the day -Current dietary habits: eating more fruit and vegetables lately; has been making blueberry spread  which has curbed his appetite -Current exercise habits: patient is not regularly exercising -Denies hypotensive/hypertensive symptoms -Educated on Exercise goal of 150 minutes per week; Proper BP monitoring technique; -Counseled to monitor BP at home twice weekly, document, and provide log at future appointments -Recommended moving lisinopril to the evening to help with morning BP lowering   Hyperlipidemia: (LDL goal < 70) -Uncontrolled -Current treatment: . No medications -Medications previously tried: none  -Current dietary patterns: eating more fruit and  vegetables lately; has been making blueberry spread  which has curbed his appetite -Current exercise habits: patient is not exercising regularly -Educated on Benefits of statin for ASCVD risk reduction; -Recommended repeat lipid panel  Atrial Fibrillation (Goal: prevent  stroke and major bleeding) -Controlled -CHADSVASC: 5 -Current treatment: . Rate control: diltiazem 180 mg 1 capsule daily . Anticoagulation: warfarin 5 mg every Wed and Sat and 2.5 mg on all other days -Medications previously tried: none -Home BP and HR readings: 150-160/120 in early morning, 117/74 during the day -Counseled on bleeding risk associated with Eliquis and importance of self-monitoring for signs/symptoms of bleeding; avoidance of NSAIDs due to increased bleeding risk with anticoagulants; -Recommended to continue current medication  Gout (Goal: uric acid < 6 and prevent gout flare ups) -Controlled -Current treatment  . Allopurinol 300 mg 1 tablet daily -Medications previously tried: none  -Recommended to continue current medication Recommended repeat uric acid level  Arthritis (Goal: minimize pain in hand) -Controlled -Current treatment  . Ibuprofen 200 mg 1 tablet three times daily  -Medications previously tried: Voltaren gel (ineffective) -Recommended preference of Tylenol over NSAIDs for pain   Health Maintenance -Vaccine gaps: Shingles, tetanus, pneumonia and influenza -Current therapy:  . None -Educated on  continuing as is -Patient is satisfied with current therapy and denies issues -Recommended to continue without supplementation  Patient Goals/Self-Care Activities . Over the next 90 days, patient will:  - check blood pressure twice weekly, document, and provide at future appointments  Follow Up Plan: Telephone follow up appointment with care management team member scheduled for: 3 months       Medication Assistance: None required.  Patient affirms current coverage meets  needs.  Patient's preferred pharmacy is:  [facility] [address] [place] [address] Phone: [phone] Fax: [place] [address], [place] - [place] [address] [place] [address] Phone: [phone] Fax: [phone]  Uses pill box? Yes Pt endorses 90% compliance  We discussed: Current pharmacy is preferred with insurance plan and patient is satisfied with pharmacy services Patient decided to: Continue current medication management strategy  Care Plan and Follow Up Patient Decision:  Patient agrees to Care Plan and Follow-up.  Plan: Telephone follow up appointment with care management team member scheduled for:  3 months  Jeni Salles, PharmD [place] Pharmacist [place] [facility] [phone]

## 2020-06-20 ENCOUNTER — Encounter: Payer: Self-pay | Admitting: Family Medicine

## 2020-06-20 ENCOUNTER — Ambulatory Visit (INDEPENDENT_AMBULATORY_CARE_PROVIDER_SITE_OTHER): Payer: Medicare Other | Admitting: Family Medicine

## 2020-06-20 ENCOUNTER — Other Ambulatory Visit: Payer: Self-pay

## 2020-06-20 VITALS — BP 150/98 | HR 90 | Temp 97.7°F | Wt 218.1 lb

## 2020-06-20 DIAGNOSIS — W57XXXA Bitten or stung by nonvenomous insect and other nonvenomous arthropods, initial encounter: Secondary | ICD-10-CM | POA: Diagnosis not present

## 2020-06-20 DIAGNOSIS — I10 Essential (primary) hypertension: Secondary | ICD-10-CM

## 2020-06-20 DIAGNOSIS — R519 Headache, unspecified: Secondary | ICD-10-CM | POA: Diagnosis not present

## 2020-06-20 MED ORDER — DOXYCYCLINE HYCLATE 100 MG PO TABS: 100.0000 mg | ORAL_TABLET | Freq: Two times a day (BID) | ORAL | 0 refills | Status: DC

## 2020-06-20 NOTE — Patient Instructions (Signed)
Tick Bite Information, Adult Ticks are insects that draw blood for food. Most ticks live in shrubs and grassy and wooded areas. They climb onto people and animals that brush against the leaves and grasses that they rest on. Then they bite, attaching themselves to the skin. Most ticks are harmless, but some ticks may carry germs that can spread to a person through a bite and cause a disease. To reduce your risk of getting a disease from a tick bite, make sure you:  Take steps to prevent tick bites.  Check for ticks after being outdoors where ticks live.  Watch for symptoms of disease if a tick attached to you or if you suspect a tick bite. How can I prevent tick bites? Take these steps to help prevent tick bites when you go outdoors in an area where ticks live: Use insect repellent  Use insect repellent that has DEET (20% or higher), picaridin, or IR3535 in it. Follow the instructions on the label. Use these products on: ? Bare skin. ? The top of your boots. ? Your pant legs. ? Your sleeve cuffs.  For insect repellent that contains permethrin, follow the instructions on the label. Use these products on: ? Clothing. ? Boots. ? Outdoor gear. ? Tents. When you are outside  Wear protective clothing. Long sleeves and long pants offer the best protection from ticks.  Wear light-colored clothing so you can see ticks more easily.  Tuck your pant legs into your socks.  If you go walking on a trail, stay in the middle of the trail so your skin, hair, and clothing do not touch the bushes.  Avoid walking through areas with long grass.  Check for ticks on your clothing, hair, and skin often while you are outside, and check again before you go inside. Make sure to check the scalp, neck, armpits, waist, groin, and joint areas. These are the spots where ticks attach themselves most often. When you go indoors  Check your clothing for ticks. Tumble dry clothes in a dryer on high heat for at least  10 minutes. If clothes are damp, additional time may be needed. If clothes require washing, use hot water.  Examine gear and pets.  Shower soon after being outdoors.  Check your body for ticks. Conduct a full body check using a mirror. What is the proper way to remove a tick? If you find a tick on your body, remove it as soon as possible. Removing a tick sooner can prevent germs from passing to your body. Do not remove the tick with your bare fingers. To remove a tick that is crawling on your skin but has not bitten, use either of these methods:  Go outdoors and brush the tick off.  Remove the tick with tape or a lint roller. To remove a tick that is attached to your skin: 1. Wash your hands. If you have latex gloves, put them on. 2. Use fine-tipped tweezers, curved forceps, or a tick-removal tool to gently grasp the tick as close to your skin and the tick's head as possible. 3. Gently pull with a steady, upward, even pressure until the tick lets go. 4. When removing the tick: ? Take care to keep the tick's head attached to its body. ? Do not twist or jerk the tick. This can make the tick's head or mouth parts break off and remain in the skin. ? Do not squeeze or crush the tick's body. This could force disease-carrying fluids from the tick   into your body. Do not try to remove a tick with heat, alcohol, petroleum jelly, or fingernail polish. Using these methods can cause the tick to salivate and regurgitate into your bloodstream, increasing your risk of getting a disease.   What should I do after removing a tick?  Dispose of the tick. Do not crush a tick with your fingers.  Clean the bite area and your hands with soap and water, rubbing alcohol, or an iodine scrub.  If an antiseptic cream or ointment is available, apply a small amount to the bite site.  Wash and disinfect any instruments that you used to remove the tick. How should I dispose of a tick? To dispose of a live tick, use  one of these methods:  Place it in rubbing alcohol.  Place it in a sealed bag or container.  Wrap it tightly in tape.  Flush it down the toilet. Contact a health care provider if:  You have symptoms of a disease after a tick bite. Symptoms of a tick-borne disease can occur from moments after the tick bites to 30 days after a tick is removed. Symptoms include: ? Fever or chills. ? Any of these signs in the bite area:  A red rash that makes a circle (bull's-eye rash) in the bite area.  Redness and swelling. ? Headache. ? Muscle, joint, or bone pain. ? Abnormal tiredness. ? Numbness in your legs or difficulty walking or moving your legs. ? Tender, swollen lymph glands.  A part of a tick breaks off and gets stuck in your skin. Get help right away if:  You are not able to remove a tick.  You experience muscle weakness or paralysis.  Your symptoms get worse or you experience new symptoms.  You find an engorged tick on your skin and you are in an area where disease from ticks is a high risk. Summary  Ticks may carry germs that can spread to a person through a bite and cause a disease.  Wear protective clothing and use insect repellent to prevent tick bites. Follow the instructions on the label.  If you find a tick on your body, remove it as soon as possible. If the tick is attached, do not try to remove with heat, alcohol, petroleum jelly, or fingernail polish.  Remove the attached tick using fine-tipped tweezers, curved forceps, or a tick-removal tool. Gently pull with steady, upward, even pressure until the tick lets go. Do not twist or jerk the tick. Do not squeeze or crush the tick's body.  If you have symptoms of a disease after being bitten by a tick, contact a health care provider. This information is not intended to replace advice given to you by your health care provider. Make sure you discuss any questions you have with your health care provider. Document Revised:  01/19/2019 Document Reviewed: 01/19/2019 Elsevier Patient Education  2021 [employer]  

## 2020-06-20 NOTE — Progress Notes (Signed)
Established Patient Office Visit  Subjective:  Patient ID: Jose Bonilla, male    DOB: 1945-11-07  Age: 75 y.o. MRN: [id number]  CC:  Chief Complaint  Patient presents with  . Tick Removal    Tick bite, x 2 weeks, last week pt started to feel "off." very fatigued, had a slight head ache    HPI Jose Bonilla presents for somewhat vague symptoms following tick bite about 2 weeks ago.  He pulled off what he states was a [place] tick left waist area.  He states this actually may have been as long as 3 to 4 weeks ago.  He feels that he was able to remove the tick entirely.  About 10 days ago he developed some fatigue and slight bifrontal headache.  No increased arthralgias.  No skin rash.  His other chronic problems include history of atrial fibrillation, hypertension, gout, chronic Coumadin therapy.  Not monitoring blood pressures regularly at home currently.  Very high reading here initially today.  No alcohol use.  He states he is compliant with blood pressure medications including diltiazem, lisinopril, and atenolol.  Past Medical History:  Diagnosis Date  . Chronic atrial fibrillation (HCC)    Coumadin therapy  . CVA (cerebral infarction)    Possible history  . Essential hypertension   . Gout   . Overweight(278.02)   . Seasonal allergies   . Secondary cardiomyopathy ([place])    LVEF 40-45% June 2013    No past surgical history on file.  Family History  Problem Relation Age of Onset  . Heart disease Mother   . Hypertension Father   . Diabetes Paternal Aunt     Social History   Socioeconomic History  . Marital status: Married    Spouse name: Not on file  . Number of children: Not on file  . Years of education: Not on file  . Highest education level: Not on file  Occupational History  . Not on file  Tobacco Use  . Smoking status: Never Smoker  . Smokeless tobacco: Never Used  Vaping Use  . Vaping Use: Never used  Substance and Sexual Activity  . Alcohol use: No     Alcohol/week: 0.0 standard drinks    Comment: no use   . Drug use: No  . Sexual activity: Not on file  Other Topics Concern  . Not on file  Social History Narrative   Lives with wife   Married x 75 years   Children; no children   Hobbies; garden   Social Determinants of Radio broadcast assistant Strain: Low Risk   . Difficulty of Paying Living Expenses: Not hard at all  Food Insecurity: No Food Insecurity  . Worried About Charity fundraiser in the Last Year: Never true  . Ran Out of Food in the Last Year: Never true  Transportation Needs: No Transportation Needs  . Lack of Transportation (Medical): No  . Lack of Transportation (Non-Medical): No  Physical Activity: Inactive  . Days of Exercise per Week: 0 days  . Minutes of Exercise per Session: 0 min  Stress: No Stress Concern Present  . Feeling of Stress : Not at all  Social Connections: Moderately Integrated  . Frequency of Communication with Friends and Family: More than three times a week  . Frequency of Social Gatherings with Friends and Family: More than three times a week  . Attends Religious Services: More than 4 times per year  . Active Member of Clubs or  Organizations: No  . Attends Archivist Meetings: Never  . Marital Status: Married  Human resources officer Violence: Not At Risk  . Fear of Current or Ex-Partner: No  . Emotionally Abused: No  . Physically Abused: No  . Sexually Abused: No    Outpatient Medications Prior to Visit  Medication Sig Dispense Refill  . allopurinol (ZYLOPRIM) 300 MG tablet Take 1 tablet by mouth once daily 90 tablet 0  . atenolol (TENORMIN) 100 MG tablet Take 1/2 (one-half) tablet by mouth twice daily 90 tablet 0  . diltiazem (CARDIZEM CD) 180 MG 24 hr capsule Take 1 capsule by mouth once daily 90 capsule 3  . ibuprofen (ADVIL) 200 MG tablet Take 200 mg by mouth every 6 (six) hours as needed.    Marland Kitchen lisinopril (ZESTRIL) 20 MG tablet Take 1 tablet by mouth once daily 90 tablet 0   . warfarin (COUMADIN) 2.5 MG tablet Take 1 tablet daily except take 2 tablets on Wed and Saturdays or Take as directed by anticoagulation clinic 120 tablet 0   No facility-administered medications prior to visit.    Allergies  Allergen Reactions  . Indomethacin Other (See Comments)    Increased heart rate and elevated BP per patient    ROS Review of Systems  Constitutional: Positive for fatigue.  Eyes: Negative for visual disturbance.  Respiratory: Negative for cough, chest tightness and shortness of breath.   Cardiovascular: Negative for chest pain, palpitations and leg swelling.  Musculoskeletal: Negative for arthralgias.  Skin: Negative for rash.  Neurological: Positive for headaches. Negative for dizziness, syncope, weakness and light-headedness.      Objective:    Physical Exam Vitals reviewed.  Constitutional:      Appearance: Normal appearance.  Cardiovascular:     Rate and Rhythm: Normal rate and regular rhythm.  Pulmonary:     Effort: Pulmonary effort is normal.     Breath sounds: Normal breath sounds.  Skin:    Findings: No rash.  Neurological:     Mental Status: He is alert.     BP (!) 150/98 (BP Location: Left Arm, Cuff Size: Large)   Pulse 90   Temp 97.7 F (36.5 C) (Oral)   Wt 218 lb 1.6 oz (98.9 kg)   SpO2 98%   BMI 33.16 kg/m  Wt Readings from Last 3 Encounters:  06/20/20 218 lb 1.6 oz (98.9 kg)  01/22/20 228 lb 6.4 oz (103.6 kg)  04/29/19 230 lb 4.8 oz (104.5 kg)     Health Maintenance Due  Topic Date Due  . TETANUS/TDAP  Never done  . PNA vac Low Risk Adult (1 of 2 - PCV13) Never done  . COVID-19 Vaccine (4 - Booster for Moderna series) 05/02/2020    There are no preventive care reminders to display for this patient.  Lab Results  Component Value Date   TSH 1.80 05/10/2011   Lab Results  Component Value Date   WBC 6.5 12/28/2016   HGB 13.4 12/28/2016   HCT 43.8 12/28/2016   MCV 87.3 12/28/2016   PLT 149 (L) 12/28/2016    Lab Results  Component Value Date   NA 142 11/15/2017   K 4.5 11/15/2017   CO2 28 11/15/2017   GLUCOSE 100 (H) 11/15/2017   BUN 24 (H) 11/15/2017   CREATININE 1.13 11/15/2017   BILITOT 0.9 06/20/2016   ALKPHOS 78 06/20/2016   AST 20 06/20/2016   ALT 16 06/20/2016   PROT 6.5 06/20/2016   ALBUMIN 4.2 06/20/2016  CALCIUM 9.2 11/15/2017   ANIONGAP 10 12/28/2016   GFR 67.49 11/15/2017   Lab Results  Component Value Date   CHOL 151 08/16/2014   Lab Results  Component Value Date   HDL 27.50 (L) 08/16/2014   No results found for: [facility] Lab Results  Component Value Date   TRIG 440.0 (H) 08/16/2014   Lab Results  Component Value Date   CHOLHDL 5 08/16/2014   No results found for: HGBA1C    Assessment & Plan:   #1 recent tick bite.  Patient has vague symptoms including low-grade headache and fatigue of uncertain etiology.  Does not have any fever or skin rash.  Low clinical suspicion for [facility] spotted fever. -We decided to go and cover with doxycycline 100 mg twice daily for 10 days -We discussed limitations of antibody testing -Follow-up promptly for any fever, rash, or any other new symptoms -We did mention that antibiotics including doxycycline can affect Coumadin pro time and he plans to get his pro time checked next Wednesday  #2 hypertension-poorly controlled by initial reading but this did come down some during the office visit. -Keep sodium intake less than 2400 mg daily -Set up office follow-up in 1 month reassess.  Consider bring his blood pressure cuff in for comparison.  Adjust medication accordingly if still up at that time   Meds ordered this encounter  Medications  . doxycycline (VIBRA-TABS) 100 MG tablet    Sig: Take 1 tablet (100 mg total) by mouth 2 (two) times daily.    Dispense:  20 tablet    Refill:  0    Follow-up: Return in about 1 month (around 07/21/2020).    Carolann Littler, MD

## 2020-06-21 ENCOUNTER — Other Ambulatory Visit: Payer: Self-pay

## 2020-06-22 ENCOUNTER — Ambulatory Visit (INDEPENDENT_AMBULATORY_CARE_PROVIDER_SITE_OTHER): Payer: Medicare Other | Admitting: General Practice

## 2020-06-22 DIAGNOSIS — I4891 Unspecified atrial fibrillation: Secondary | ICD-10-CM | POA: Diagnosis not present

## 2020-06-22 DIAGNOSIS — Z7901 Long term (current) use of anticoagulants: Secondary | ICD-10-CM

## 2020-06-22 LAB — POCT INR: INR: 3.1 — AB (ref 2.0–3.0)

## 2020-06-22 NOTE — Patient Instructions (Addendum)
Pre visit review using our clinic review tool, if applicable. No additional management support is needed unless otherwise documented below in the visit note.  Take 1 tablet today (5/18) and then take 1 tablet until you finish the doxycycline. On the 27 th continue to take 1 tablet daily except 2 tablets on Wednesdays and Saturday.  Re-check on 6/1.

## 2020-07-06 ENCOUNTER — Other Ambulatory Visit: Payer: Self-pay

## 2020-07-06 ENCOUNTER — Ambulatory Visit (INDEPENDENT_AMBULATORY_CARE_PROVIDER_SITE_OTHER): Payer: Medicare Other | Admitting: General Practice

## 2020-07-06 DIAGNOSIS — I4891 Unspecified atrial fibrillation: Secondary | ICD-10-CM | POA: Diagnosis not present

## 2020-07-06 DIAGNOSIS — Z7901 Long term (current) use of anticoagulants: Secondary | ICD-10-CM

## 2020-07-06 LAB — POCT INR: INR: 3.5 — AB (ref 2.0–3.0)

## 2020-07-06 NOTE — Patient Instructions (Addendum)
Pre visit review using our clinic review tool, if applicable. No additional management support is needed unless otherwise documented below in the visit note.  Hold dosage tomorrow (6/2) and then change dosage and take 1 tablet daily except 2 tablets on Saturdays.  Re-check in 3 weeks.

## 2020-07-18 ENCOUNTER — Other Ambulatory Visit: Payer: Self-pay | Admitting: Family Medicine

## 2020-07-18 DIAGNOSIS — I10 Essential (primary) hypertension: Secondary | ICD-10-CM

## 2020-07-18 DIAGNOSIS — M1A00X0 Idiopathic chronic gout, unspecified site, without tophus (tophi): Secondary | ICD-10-CM

## 2020-07-22 ENCOUNTER — Telehealth: Payer: Self-pay | Admitting: Pharmacist

## 2020-07-24 NOTE — Chronic Care Management (AMB) (Addendum)
Chronic Care Management Pharmacy Assistant   Name: Jose Bonilla  MRN: [id number] DOB: [birth year]  Reason for Encounter: Disease State   Conditions to be addressed/monitored: HTN  Recent office visits:  05.16.2022 Jose Post, MD patient seen for acute visit. Patient seen for tick bite. Medication prescribed Doxycycline Hyclate 100 mg Oral 2 times daily  Recent consult visits:  None  Hospital visits:  None in previous 6 months  Medications: Outpatient Encounter Medications as of 07/22/2020  Medication Sig   allopurinol (ZYLOPRIM) 300 MG tablet Take 1 tablet by mouth once daily   atenolol (TENORMIN) 100 MG tablet Take 1/2 (one-half) tablet by mouth twice daily   diltiazem (CARDIZEM CD) 180 MG 24 hr capsule Take 1 capsule by mouth once daily   doxycycline (VIBRA-TABS) 100 MG tablet Take 1 tablet (100 mg total) by mouth 2 (two) times daily.   ibuprofen (ADVIL) 200 MG tablet Take 200 mg by mouth every 6 (six) hours as needed.   lisinopril (ZESTRIL) 20 MG tablet Take 1 tablet by mouth once daily   warfarin (COUMADIN) 2.5 MG tablet Take 1 tablet daily except take 2 tablets on Wed and Saturdays or Take as directed by anticoagulation clinic   No facility-administered encounter medications on file as of 07/22/2020.   Reviewed chart prior to disease state call. Spoke with patient regarding BP  Recent Office Vitals: BP Readings from Last 3 Encounters:  06/20/20 (!) 150/98  01/22/20 (!) 150/86  04/29/19 138/88   Pulse Readings from Last 3 Encounters:  06/20/20 90  01/22/20 84  04/29/19 (!) 110    Wt Readings from Last 3 Encounters:  06/20/20 218 lb 1.6 oz (98.9 kg)  01/22/20 228 lb 6.4 oz (103.6 kg)  04/29/19 230 lb 4.8 oz (104.5 kg)     Kidney Function Lab Results  Component Value Date/Time   CREATININE 1.13 11/15/2017 09:18 AM   CREATININE 1.35 (H) 12/28/2016 10:00 PM   GFR 67.49 11/15/2017 09:18 AM   GFRNONAA 51 (L) 12/28/2016 10:00 PM   GFRAA 59 (L)  12/28/2016 10:00 PM    BMP Latest Ref Rng & Units 11/15/2017 12/28/2016 12/17/2016  Glucose 70 - 99 mg/dL 100(H) 129(H) -  BUN 6 - 23 mg/dL 24(H) 20 -  Creatinine 0.40 - 1.50 mg/dL 1.13 1.35(H) 1.00  Sodium 135 - 145 mEq/L 142 136 -  Potassium 3.5 - 5.1 mEq/L 4.5 4.3 -  Chloride 96 - 112 mEq/L 106 105 -  CO2 19 - 32 mEq/L 28 21(L) -  Calcium 8.4 - 10.5 mg/dL 9.2 8.8(L) -   Current antihypertensive regimen:  Diltiazem 180 mg 1 capsule daily Atenolol 100 mg 1/2 tablet twice daily Lisinopril 20 mg 1 tablet daily How often are you checking your Blood Pressure? 3-5x per week Current home BP readings:  06.12 135/85 06.13 142/80 06.14 138/90 06.15 136/82 06.16 136/80 What recent interventions/DTPs have been made by any provider to improve Blood Pressure control since last CPP Visit: None Any recent hospitalizations or ED visits since last visit with CPP? No What diet changes have been made to improve Blood Pressure Control?  Increased vegetables and water intake. Portion control better What exercise is being done to improve your Blood Pressure Control?  Walking more and gardening 2-3 times a week  Adherence Review: Is the patient currently on ACE/ARB medication? Yes Does the patient have >5 day gap between last estimated fill dates? No  I spoke with the patient about medication adherence. He states that he  has been doing well. He saw his PCP in May and he was concerned about his blood pressure readings. He states that he has dramatically changed his diet. He has stopped drinking soda and increased his water intake. He has cut his carbohydrate intake in half and is eating more fruits and vegetables. Since he was last seen by the doctor, he states that he is lost about 10 pounds. He stated that he is feeling much better. he states that he is having less swelling in his joints and his legs. He stated that he is walking more during the week, and he can do his gardening. There have been no  recent changes to his medications. He stated he is not experiencing any side effects from his medications. There have been no emergency department or urgent care visits since his last CPP or PCP visit. He currently does not have any issues with his pharmacy. He stated he would like to make an appointment with his primary care doctor after he gets under 200 pounds. His next CCM will appointment is scheduled for August 2022.   Star Rating Drugs: Medication Dispensed  Quantity Pharmacy  Lisinopril 20 mg  03.15.2022 [facility]    Amilia (Revonda Standard, [place] Pharmacist Assistant [phone]

## 2020-07-27 ENCOUNTER — Ambulatory Visit (INDEPENDENT_AMBULATORY_CARE_PROVIDER_SITE_OTHER): Payer: Medicare Other | Admitting: General Practice

## 2020-07-27 ENCOUNTER — Other Ambulatory Visit: Payer: Self-pay

## 2020-07-27 DIAGNOSIS — Z7901 Long term (current) use of anticoagulants: Secondary | ICD-10-CM

## 2020-07-27 DIAGNOSIS — I4891 Unspecified atrial fibrillation: Secondary | ICD-10-CM

## 2020-07-27 LAB — POCT INR: INR: 2.2 (ref 2.0–3.0)

## 2020-07-27 NOTE — Patient Instructions (Addendum)
Pre visit review using our clinic review tool, if applicable. No additional management support is needed unless otherwise documented below in the visit note.  Continue to take 1 tablet daily except 2 tablets on Saturdays.  Re-check in 6 weeks.

## 2020-08-19 ENCOUNTER — Ambulatory Visit: Payer: Medicare Other | Admitting: Family Medicine

## 2020-08-22 ENCOUNTER — Other Ambulatory Visit: Payer: Self-pay | Admitting: Family Medicine

## 2020-08-22 DIAGNOSIS — Z7901 Long term (current) use of anticoagulants: Secondary | ICD-10-CM

## 2020-09-06 ENCOUNTER — Other Ambulatory Visit: Payer: Self-pay

## 2020-09-07 ENCOUNTER — Ambulatory Visit (INDEPENDENT_AMBULATORY_CARE_PROVIDER_SITE_OTHER): Payer: Medicare Other | Admitting: General Practice

## 2020-09-07 DIAGNOSIS — I4891 Unspecified atrial fibrillation: Secondary | ICD-10-CM | POA: Diagnosis not present

## 2020-09-07 DIAGNOSIS — Z7901 Long term (current) use of anticoagulants: Secondary | ICD-10-CM

## 2020-09-07 LAB — POCT INR: INR: 1.5 — AB (ref 2.0–3.0)

## 2020-09-07 NOTE — Patient Instructions (Addendum)
Pre visit review using our clinic review tool, if applicable. No additional management support is needed unless otherwise documented below in the visit note.  Take 2 tablets today and tomorrow and then continue to take 1 tablet daily except 2 tablets on Saturdays.  Re-check in 3 to 4 weeks.

## 2020-09-16 ENCOUNTER — Telehealth: Payer: Self-pay | Admitting: Pharmacist

## 2020-09-16 NOTE — Chronic Care Management (AMB) (Signed)
    Chronic Care Management Pharmacy Assistant   Name: Jose Bonilla  MRN: [id number] DOB: 04-19-1945  09/16/20-  Called patient to remind of appointment with Jeni Salles) on (09/19/20 at 10am via telephone.)  Patient aware of appointment date, time, and type of appointment (either telephone or in person). Patient aware to have/bring all medications, supplements, blood pressure and/or blood sugar logs to visit.  Questions: Have you had any recent office visit or specialist visit outside of [facility]? No.  Are there any concerns you would like to discuss during your office visit? No concerns at this time. Patient wanted to be explained in detail the benefit of the CCM program with [facility]. I explained to patient in detail the benefit and details of the program. Patient decided to say with program and keep his appointment.   Are you having any problems obtaining your medications? Patient has had no issues.   If patient has any PAP medications ask if they are having any problems getting their PAP medication or refill? No patient assistance at this time.   Care Gaps:  AWV - message sent to [name] to schedule Tetanus/TDAP - never done Zoster vaccines - never done Pneumonia vaccine - never done Covid - 19 vaccine booster 4 - overdue since 05/02/20 Influenza vaccine - due  Star Rating Drug:  Lisinopril '20mg'$  - last filled on 08/23/20 90DS at [facility]  Any gaps in medications fill history? No.  [place]  Clinical Pharmacist Assistant [phone]

## 2020-09-19 ENCOUNTER — Telehealth: Payer: Medicare Other

## 2020-09-19 ENCOUNTER — Telehealth: Payer: Self-pay | Admitting: Pharmacist

## 2020-09-19 NOTE — Telephone Encounter (Signed)
  Chronic Care Management   Outreach Note  09/19/2020 Name: Jose Bonilla MRN: [id number] DOB: 02-09-1945  Referred by: [name], MD  Patient had a phone appointment scheduled with clinical pharmacist today.  An unsuccessful telephone outreach was attempted today. The patient was referred to the pharmacist for assistance with care management and care coordination.   If possible, a message was left to return call to: [phone] or to [facility]: [place], PharmD, [place] [facility] [place]

## 2020-09-19 NOTE — Progress Notes (Deleted)
Chronic Care Management Pharmacy Note  09/19/2020 Name:  Jose Bonilla MRN:  [id number] DOB:  May 01, 1945  Summary: ***   Recommendations/Changes made from today's visit: ***   Plan: ***  Subjective: Jose Bonilla is an 75 y.o. year old male who is a primary patient of Burchette, Alinda Sierras, MD.  The CCM team was consulted for assistance with disease management and care coordination needs.    Engaged with patient by telephone for follow up visit in response to provider referral for pharmacy case management and/or care coordination services.   Consent to Services:  The patient was given information about Chronic Care Management services, agreed to services, and gave verbal consent prior to initiation of services.  Please see initial visit note for detailed documentation.   Patient Care Team: [name], MD as PCP - General (Family Medicine) Satira Sark, MD as PCP - Cardiology (Cardiology) Viona Gilmore, [facility] as Pharmacist (Pharmacist)  Recent office visits: 09/07/20 Meriam Sprague, RN: Patient presented for anti-coag visit. INR 1.5, goal 2-3. Boosted dose x 2 days. Then continued warfarin 5 mg every Sat and 2.5 mg on all other days.  06/20/20 Burchette, Alinda Sierras, MD patient seen for acute visit. Patient seen for tick bite. Prescribed Doxycycline Hyclate 100 mg 2 times daily.  12/18/19 Ofilia Neas, LPN: Patient presented for medicare annual wellness visit.  Recent consult visits: 01/22/20 Rozann Lesches, MD (cardiology): Patient presented for Afib follow up. BP elevated in office.  01/19/20 Lavonna Monarch, MD (dermatology): Patient presented for actinic keratosis follow up.   Hospital visits: None in previous 6 months  Objective:  Lab Results  Component Value Date   CREATININE 1.13 11/15/2017   BUN 24 (H) 11/15/2017   GFR 67.49 11/15/2017   GFRNONAA 51 (L) 12/28/2016   GFRAA 59 (L) 12/28/2016   NA 142 11/15/2017   K 4.5 11/15/2017   CALCIUM 9.2 11/15/2017    CO2 28 11/15/2017    Lab Results  Component Value Date/Time   GFR 67.49 11/15/2017 09:18 AM   GFR 76.26 06/20/2016 02:29 PM    Last diabetic Eye exam: No results found for: HMDIABEYEEXA  Last diabetic Foot exam: No results found for: HMDIABFOOTEX   Lab Results  Component Value Date   CHOL 151 08/16/2014   HDL 27.50 (L) 08/16/2014   LDLDIRECT 67.0 08/16/2014   TRIG 440.0 (H) 08/16/2014   CHOLHDL 5 08/16/2014    Hepatic Function Latest Ref Rng & Units 06/20/2016 08/16/2014  Total Protein 6.0 - 8.3 g/dL 6.5 6.8  Albumin 3.5 - 5.2 g/dL 4.2 4.0  AST 0 - 37 U/L 20 23  ALT 0 - 53 U/L 16 18  Alk Phosphatase 39 - 117 U/L 78 75  Total Bilirubin 0.2 - 1.2 mg/dL 0.9 0.8  Bilirubin, Direct 0.0 - 0.3 mg/dL 0.2 0.1    Lab Results  Component Value Date/Time   TSH 1.80 05/10/2011 01:02 PM    CBC Latest Ref Rng & Units 12/28/2016 06/20/2016 11/18/2014  WBC 4.0 - 10.5 K/uL 6.5 5.0 6.6  Hemoglobin 13.0 - 17.0 g/dL 13.4 14.0 12.7(L)  Hematocrit 39.0 - 52.0 % 43.8 43.2 39.3  Platelets 150 - 400 K/uL 149(L) 162.0 194.0    No results found for: VD25OH  Clinical ASCVD: Yes  The ASCVD Risk score Jose Bonilla DC Jr., et al., 2013) failed to calculate for the following reasons:   Cannot find a previous HDL lab   Cannot find a previous total cholesterol lab    Depression  screen [facility] 2/9 12/18/2019 11/15/2017 03/14/2016  Decreased Interest 0 0 0  Down, Depressed, Hopeless 0 0 0  PHQ - 2 Score 0 0 0  Altered sleeping 0 - -  Tired, decreased energy 0 - -  Change in appetite 0 - -  Feeling bad or failure about yourself  0 - -  Trouble concentrating 0 - -  Moving slowly or fidgety/restless 0 - -  Suicidal thoughts 0 - -  PHQ-9 Score 0 - -  Difficult doing work/chores Not difficult at all - -    CHA2DS2/VAS Stroke Risk Points  Current as of 5 days ago     3 >= 2 Points: High Risk  1 - 1.99 Points: Medium Risk  0 Points: Low Risk    No Change      Details    This score determines the  patient's risk of having a stroke if the  patient has atrial fibrillation.       Points Metrics  0 Has Congestive Heart Failure:  No    Current as of 5 days ago  0 Has Vascular Disease:  No    Current as of 5 days ago  1 Has Hypertension:  Yes    Current as of 5 days ago  2 Age:  89    Current as of 5 days ago  0 Has Diabetes:  No    Current as of 5 days ago  2 Had Stroke:  No  Had TIA:  Yes  Had Thromboembolism:  No    Current as of 5 days ago  0 Male:  No    Current as of 5 days ago     Social History   Tobacco Use  Smoking Status Never  Smokeless Tobacco Never   BP Readings from Last 3 Encounters:  06/20/20 (!) 150/98  01/22/20 (!) 150/86  04/29/19 138/88   Pulse Readings from Last 3 Encounters:  06/20/20 90  01/22/20 84  04/29/19 (!) 110   Wt Readings from Last 3 Encounters:  06/20/20 218 lb 1.6 oz (98.9 kg)  01/22/20 228 lb 6.4 oz (103.6 kg)  04/29/19 230 lb 4.8 oz (104.5 kg)    Assessment/Interventions: Review of patient past medical history, allergies, medications, health status, including review of consultants reports, laboratory and other test data, was performed as part of comprehensive evaluation and provision of chronic care management services.   SDOH:  (Social Determinants of Health) assessments and interventions performed: No   CCM Care Plan  Allergies  Allergen Reactions   Indomethacin Other (See Comments)    Increased heart rate and elevated BP per patient    Medications Reviewed Today     Reviewed by [name], MD (Physician) on 06/20/20 at [facility] List Status: <None>   Medication Order Taking? Sig Documenting Provider Last Dose Status Informant  allopurinol (ZYLOPRIM) 300 MG tablet [id number] Yes Take 1 tablet by mouth once daily Burchette, Alinda Sierras, MD Taking Active   atenolol (TENORMIN) 100 MG tablet [id number] Yes Take 1/2 (one-half) tablet by mouth twice daily Burchette, Alinda Sierras, MD Taking Active   diltiazem (CARDIZEM CD)  180 MG 24 hr capsule [id number] Yes Take 1 capsule by mouth once daily Satira Sark, MD Taking Active   doxycycline (VIBRA-TABS) 100 MG tablet [id number] Yes Take 1 tablet (100 mg total) by mouth 2 (two) times daily. [name], MD  Active   ibuprofen (ADVIL) 200 MG tablet [id number] Yes Take 200 mg by mouth  every 6 (six) hours as needed. [provider] Taking Active   lisinopril (ZESTRIL) 20 MG tablet [id number] Yes Take 1 tablet by mouth once daily Burchette, Alinda Sierras, MD Taking Active   warfarin (COUMADIN) 2.5 MG tablet [id number] Yes Take 1 tablet daily except take 2 tablets on Wed and Saturdays or Take as directed by anticoagulation clinic [name], MD Taking Active             Patient Active Problem List   Diagnosis Date Noted   Long term (current) use of anticoagulants 10/17/2016   Acute gout 02/05/2016   Chronic gout 02/05/2016   Encounter for therapeutic drug monitoring 03/19/2013   Atrial fibrillation ([place])    Ejection fraction < 50%    Warfarin anticoagulation    CVA (cerebral infarction)    Overweight(278.02)    Edema    Hypertension 05/10/2011    Immunization History  Administered Date(s) Administered   Moderna SARS-COV2 Booster Vaccination 02/02/2020   Moderna Sars-Covid-2 Vaccination 03/21/2019, 04/18/2019    Conditions to be addressed/monitored:  Hypertension, Hyperlipidemia, Atrial Fibrillation, Gout and arthritis  There are no care plans that you recently modified to display for this patient.   Medication Assistance: None required.  Patient affirms current coverage meets needs.  Compliance/Adherence/Medication fill history: Care Gaps: Shingrix, influenza, tetanus, COVID booster, Prevnar   Star-Rating Drugs: Lisinopril 9m - [facility]  [facility] [place] [address]Phone: [phone]:  [address] [place] [address] [place]Phone: [phone]: [phone] Uses pill box? Yes Pt endorses 90% compliance  We discussed: Current pharmacy is preferred with insurance plan and patient is satisfied with pharmacy services Patient decided to: Continue current medication management strategy  Care Plan and Follow Up Patient Decision:  Patient agrees to Care Plan and Follow-up.  Plan: Telephone follow up appointment with care management team member scheduled for:  3 months  MJeni Salles PharmD [place]tingPharmacist [phone]

## 2020-10-05 ENCOUNTER — Ambulatory Visit: Payer: Medicare Other

## 2020-10-12 NOTE — Progress Notes (Signed)
Cardiology Office Note  Date: 10/13/2020   ID: Jose Bonilla, DOB February 23, 1945, MRN [id number]  PCP:  [name], MD  Cardiologist:  Rozann Lesches, MD Electrophysiologist:  None   Chief Complaint: 6 month follow up.  History of Present Illness: Jose Bonilla is a 75 y.o. male with a history of atrial fibrillation, HTN, CVA.  He was last seen by Dr. Domenic Polite on 01/22/2020 for routine visit.  He did not report any  palpitations.  He reported NYHA class II dyspnea without chest pain or syncope.  He was continuing Coumadin with follow-up by PCP.  He did not report any spontaneous bleeding problems.  Recent follow-up echocardiogram revealed EF of 60 to 65%, normal RV contraction with RVSP estimated at 54 mmHg.  Biatrial enlargement and trivial mitral regurgitation.  CHA2DS2-VASc score was 5.  He was continuing Coumadin for stroke prophylaxis.  Heart rate was well controlled on both Cardizem CD and atenolol.  Adding diuretic was discussed.  He did not report any swelling or unusual weight gain.  He is here today for 67-monthfollow-up.  He denies any recent acute illnesses or hospitalizations in the interim since last visit.  He denies any sense of palpitations or arrhythmias.  No lightheadedness dizziness, near syncopal or syncopal episodes.  No CVA or TIA-like symptoms.  No PND or orthopnea no bleeding on Coumadin.  States she has lost a fair amount of weight over the last few months.  He has been modifying his diet which he states is contributing to his weight loss.  EKG today shows atrial fibrillation with PVC or aberrantly conducted complexes rate of 86.  Blood pressure is elevated on arrival today.  He states his blood pressure at home is much better in the 130s over 80s range.  He denies any medication changes since last visit.  Current cardiac regimen includes Coumadin 2.5 mg per Coumadin clinic.  Lisinopril 20 mg daily, atenolol 50 mg p.o. twice daily, diltiazem CD1 80 mg daily.   Past  Medical History:  Diagnosis Date   Chronic atrial fibrillation (HCC)    Coumadin therapy   CVA (cerebral infarction)    Possible history   Essential hypertension    Gout    Overweight(278.02)    Seasonal allergies    [place]    LVEF 40-45% June 2013    No past surgical history on file.  Current Outpatient Medications  Medication Sig Dispense Refill   allopurinol (ZYLOPRIM) 300 MG tablet Take 1 tablet by mouth once daily 90 tablet 0   atenolol (TENORMIN) 100 MG tablet Take 1/2 (one-half) tablet by mouth twice daily 90 tablet 0   diltiazem (CARDIZEM CD) 180 MG 24 hr capsule Take 1 capsule by mouth once daily 90 capsule 3   doxycycline (VIBRA-TABS) 100 MG tablet Take 1 tablet (100 mg total) by mouth 2 (two) times daily. 20 tablet 0   ibuprofen (ADVIL) 200 MG tablet Take 200 mg by mouth every 6 (six) hours as needed.     lisinopril (ZESTRIL) 20 MG tablet Take 1 tablet by mouth once daily 90 tablet 0   warfarin (COUMADIN) 2.5 MG tablet TAKE 1 TABLET BY MOUTH ONCE DAILY EXCEPT  TAKE  2  ON  WEDNESDAY  AND  SATURDAYS  OR  AS  DIRECTED  BY  ANTICOAGULATION  CLINIC 120 tablet 0   No current facility-administered medications for this visit.   Allergies:  Indomethacin   Social History: The patient  reports that he  has never smoked. He has never used smokeless tobacco. He reports that he does not drink alcohol and does not use drugs.   Family History: The patient's family history includes Diabetes in his paternal aunt; Heart disease in his mother; Hypertension in his father.   ROS:  Please see the history of present illness. Otherwise, complete review of systems is positive for none.  All other systems are reviewed and negative.   Physical Exam: VS:  BP (!) 152/88   Pulse 92   Ht '5\' 8"'$  (1.727 m)   Wt 206 lb 9.6 oz (93.7 kg)   SpO2 96%   BMI 31.41 kg/m , BMI Body mass index is 31.41 kg/m.  Wt Readings from Last 3 Encounters:  10/13/20 206 lb 9.6 oz (93.7 kg)   06/20/20 218 lb 1.6 oz (98.9 kg)  01/22/20 228 lb 6.4 oz (103.6 kg)    General: Patient appears comfortable at rest. Neck: Supple, no elevated JVP or carotid bruits, no thyromegaly. Lungs: Clear to auscultation, nonlabored breathing at rest. Cardiac: Irregularly irregular rate and rhythm, no S3 or significant systolic murmur, no pericardial rub. Extremities: No pitting edema, distal pulses 2+. Skin: Warm and dry. Musculoskeletal: No kyphosis. Neuropsychiatric: Alert and oriented x3, affect grossly appropriate.  ECG: 10/13/2020 atrial fibrillation with premature ventricular or aberrantly conducted complexes rate of 86.  Recent Labwork: No results found for requested labs within last 8760 hours.     Component Value Date/Time   CHOL 151 08/16/2014 1517   TRIG 440.0 (H) 08/16/2014 1517   HDL 27.50 (L) 08/16/2014 1517   CHOLHDL 5 08/16/2014 1517   VLDL 45.4 (H) 05/10/2011 1302   LDLDIRECT 67.0 08/16/2014 1517    Other Studies Reviewed Today:  Echocardiogram 01/20/2020:  1. Left ventricular ejection fraction, by estimation, is 60 to 65%. The  left ventricle has normal function. The left ventricle has no regional  wall motion abnormalities. There is moderate left ventricular hypertrophy.  Left ventricular diastolic  parameters are indeterminate in the setting of atrial fibrillation.   2. Right ventricular systolic function is normal. The right ventricular  size is mildly enlarged. There is moderately elevated pulmonary artery  systolic pressure. The estimated right ventricular systolic pressure is  0000000 mmHg.   3. Left atrial size was moderately dilated.   4. Right atrial size was severely dilated.   5. The mitral valve is grossly normal. Trivial mitral valve  regurgitation.   6. The aortic valve is tricuspid. Aortic valve regurgitation is mild.  Aortic regurgitation PHT measures 864 msec.   7. The inferior vena cava is normal in size with <50% respiratory  variability,  suggesting right atrial pressure of 8 mmHg.    Assessment and Plan:  1. Permanent atrial fibrillation ([place])   2. History of cardiomyopathy   3. Mitral valve insufficiency, unspecified etiology   4. Primary hypertension    1. Permanent atrial fibrillation (HCC) Continues in controlled atrial fibrillation with EKG today showing atrial fibrillation with premature ventricular aberrantly conducted complexes rate of 86.  Continue Cardizem CD 180 mg p.o. daily.  Continue atenolol 50 mg p.o. twice daily.  Continue Coumadin per Coumadin clinic currently  2. History of cardiomyopathy Most recent echocardiogram 01/20/2020: EF 60 to 65%.  No WMA's, moderate LVH, indeterminate diastolic parameters, PASP 54 mmHg.  LA moderately dilated, RA severely dilated, trivial MR, mild AR.   3. Mitral valve insufficiency, unspecified etiology Trivial MR by most recent echo 01/20/2020  4.  Essential hypertension. Blood pressure elevated  today at 152/88.  Patient states he just rushed in from a job and this is the likely reason for elevated blood pressure.  He states his blood pressures at home are usually in the range of A999333 systolic and [id number] diastolic.  Continue atenolol 50 mg p.o. twice daily.  Continue lisinopril 20 mg p.o. daily.   Medication Adjustments/Labs and Tests Ordered: Current medicines are reviewed at length with the patient today.  Concerns regarding medicines are outlined above.   Disposition: Follow-up with Dr. Domenic Polite or APP 6 months  Signed, Levell July, NP 10/13/2020 5:10 PM    [facility] [place], [place], [place] [address] Phone: [phone]; Fax: [phone]

## 2020-10-13 ENCOUNTER — Encounter: Payer: Self-pay | Admitting: Family Medicine

## 2020-10-13 ENCOUNTER — Ambulatory Visit (INDEPENDENT_AMBULATORY_CARE_PROVIDER_SITE_OTHER): Payer: Medicare Other | Admitting: Family Medicine

## 2020-10-13 VITALS — BP 152/88 | HR 92 | Ht 68.0 in | Wt 206.6 lb

## 2020-10-13 DIAGNOSIS — Z8679 Personal history of other diseases of the circulatory system: Secondary | ICD-10-CM

## 2020-10-13 DIAGNOSIS — I340 Nonrheumatic mitral (valve) insufficiency: Secondary | ICD-10-CM | POA: Diagnosis not present

## 2020-10-13 DIAGNOSIS — I4821 Permanent atrial fibrillation: Secondary | ICD-10-CM

## 2020-10-13 DIAGNOSIS — I10 Essential (primary) hypertension: Secondary | ICD-10-CM | POA: Diagnosis not present

## 2020-10-13 NOTE — Patient Instructions (Addendum)
Medication Instructions:  Continue all current medications.   Labwork: none  Testing/Procedures: none  Follow-Up: 6 months   Any Other Special Instructions Will Be Listed Below (If Applicable).   If you need a refill on your cardiac medications before your next appointment, please call your pharmacy.  

## 2020-10-21 DIAGNOSIS — U071 COVID-19: Secondary | ICD-10-CM | POA: Diagnosis not present

## 2020-10-24 ENCOUNTER — Ambulatory Visit: Payer: Medicare Other

## 2020-10-25 ENCOUNTER — Other Ambulatory Visit: Payer: Self-pay | Admitting: Family Medicine

## 2020-10-25 DIAGNOSIS — I10 Essential (primary) hypertension: Secondary | ICD-10-CM

## 2020-10-25 DIAGNOSIS — M1A00X0 Idiopathic chronic gout, unspecified site, without tophus (tophi): Secondary | ICD-10-CM

## 2020-11-02 ENCOUNTER — Ambulatory Visit: Payer: Medicare Other

## 2020-11-02 ENCOUNTER — Telehealth: Payer: Self-pay | Admitting: Pharmacist

## 2020-11-02 NOTE — Chronic Care Management (AMB) (Signed)
Chronic Care Management Pharmacy Assistant   Name: Jose Bonilla  MRN: [id number] DOB: 1945-07-31   Reason for Encounter: Disease State/ Hypertension Assessment Call.   Conditions to be addressed/monitored: HTN  Recent office visits:  None.  Recent consult visits:  10/13/20 Levell July NP (Cardiology) - seen for permanent atrial fibrillation and other issues. No medication changes. Follow up in 6 months.   Hospital visits:  None in previous 6 months  Medications: Outpatient Encounter Medications as of 11/02/2020  Medication Sig   allopurinol (ZYLOPRIM) 300 MG tablet Take 1 tablet by mouth once daily   atenolol (TENORMIN) 100 MG tablet Take 1/2 (one-half) tablet by mouth twice daily   diltiazem (CARDIZEM CD) 180 MG 24 hr capsule Take 1 capsule by mouth once daily   doxycycline (VIBRA-TABS) 100 MG tablet Take 1 tablet (100 mg total) by mouth 2 (two) times daily.   ibuprofen (ADVIL) 200 MG tablet Take 200 mg by mouth every 6 (six) hours as needed.   lisinopril (ZESTRIL) 20 MG tablet Take 1 tablet by mouth once daily   warfarin (COUMADIN) 2.5 MG tablet TAKE 1 TABLET BY MOUTH ONCE DAILY EXCEPT  TAKE  2  ON  WEDNESDAY  AND  SATURDAYS  OR  AS  DIRECTED  BY  ANTICOAGULATION  CLINIC   No facility-administered encounter medications on file as of 11/02/2020.   Fill History: ALLOPURINOL 300MG    TAB 10/26/2020 90   ATENOLOL 100MG       TAB 10/26/2020 90   LISINOPRIL 20MG      TAB 08/23/2020 90   WARFARIN 2.5MG       TAB 08/23/2020 93   DILTIAZEM ER180MG    CAP 10/25/2020 90   Reviewed chart prior to disease state call. Spoke with patient regarding BP  Recent Office Vitals: BP Readings from Last 3 Encounters:  10/13/20 (!) 152/88  06/20/20 (!) 150/98  01/22/20 (!) 150/86   Pulse Readings from Last 3 Encounters:  10/13/20 92  06/20/20 90  01/22/20 84    Wt Readings from Last 3 Encounters:  10/13/20 206 lb 9.6 oz (93.7 kg)  06/20/20 218 lb 1.6 oz (98.9 kg)  01/22/20 228 lb 6.4  oz (103.6 kg)     Kidney Function Lab Results  Component Value Date/Time   CREATININE 1.13 11/15/2017 09:18 AM   CREATININE 1.35 (H) 12/28/2016 10:00 PM   GFR 67.49 11/15/2017 09:18 AM   GFRNONAA 51 (L) 12/28/2016 10:00 PM   GFRAA 59 (L) 12/28/2016 10:00 PM    BMP Latest Ref Rng & Units 11/15/2017 12/28/2016 12/17/2016  Glucose 70 - 99 mg/dL 100(H) 129(H) -  BUN 6 - 23 mg/dL 24(H) 20 -  Creatinine 0.40 - 1.50 mg/dL 1.13 1.35(H) 1.00  Sodium 135 - 145 mEq/L 142 136 -  Potassium 3.5 - 5.1 mEq/L 4.5 4.3 -  Chloride 96 - 112 mEq/L 106 105 -  CO2 19 - 32 mEq/L 28 21(L) -  Calcium 8.4 - 10.5 mg/dL 9.2 8.8(L) -    Current antihypertensive regimen:  Atenolol 100mg  - take 1/2 tablet by mouth twice daily.  Lisinopril 20mg  - take 1 tablet by mouth once daily. Diltiazem 180mg  - take 1 capsule by mouth once daily.  How often are you checking your Blood Pressure? weekly Current home BP readings: 135/86 on 10/29/20. What recent interventions/DTPs have been made by any provider to improve Blood Pressure control since last CPP Visit: None. Any recent hospitalizations or ED visits since last visit with CPP? No  Adherence Review:  Is the patient currently on ACE/ARB medication? Yes Does the patient have >5 day gap between last estimated fill dates? No  Notes: Spoke with patient and reviewed all medications as prescribed. Patient reports no changes or issues on his medications. Patient has been on a diet of eating only two meals a day. He eats a late breakfast and an early dinner. He has cut out all sugar and and starches from his diet. Patient is eating mostly lean meats and vegetables. He drinks mainly water and drinks at least half of his body weight in ounces of water a day. Patient walks regularly for his activity. Patient weight has went from 230s down to low 190s. Patient scheduled for his follow up appointment with Jeni Salles.  Care Gaps:  AWV - message sent to [name] CMA to  schedule. Tetanus/TDAP - never done Zoster vaccines - never done Covid-19 vaccine booster 4 - overdue since 04/26/20 Flu vaccine - due  Star Rating Drugs:  Lisinopril 20mg  - last filled on 08/23/20 90DS at [facility] [phone]

## 2020-11-03 ENCOUNTER — Telehealth (INDEPENDENT_AMBULATORY_CARE_PROVIDER_SITE_OTHER): Payer: Medicare Other | Admitting: Family Medicine

## 2020-11-03 ENCOUNTER — Encounter: Payer: Self-pay | Admitting: Family Medicine

## 2020-11-03 ENCOUNTER — Other Ambulatory Visit: Payer: Self-pay

## 2020-11-03 DIAGNOSIS — U071 COVID-19: Secondary | ICD-10-CM

## 2020-11-03 MED ORDER — MOLNUPIRAVIR EUA 200MG CAPSULE: 4.0000 | ORAL_CAPSULE | Freq: Two times a day (BID) | ORAL | 0 refills | Status: AC

## 2020-11-03 NOTE — Patient Instructions (Addendum)
HOME CARE TIPS:   -I sent the medication(s) we discussed to your pharmacy: No orders of the defined types were placed in this encounter.    -I sent in the [place] treatment or referral you requested per our discussion. Please see the information provided below and discuss further with the pharmacist/treatment team.   -there is a chance of rebound illness after finishing your treatment. If you become sick again please isolate for an additional 5 days.    -can use tylenol if needed for fevers, aches and pains per instructions  -can use nasal saline a few times per day if you have nasal congestion  -stay hydrated, drink plenty of fluids and eat small healthy meals - avoid dairy  -If the Covid test is positive, check out the [facility] website for more information on home care, transmission and treatment for COVID19  -follow up with your doctor in 2-3 days unless improving and feeling better  -stay home while sick, except to seek medical care. If you have COVID19, ideally it would be best to stay home for a full 10 days since the onset of symptoms PLUS one day of no fever and feeling better. Wear a good mask that fits snugly (such as N95 or KN95) if around others to reduce the risk of transmission.  It was nice to meet you today, and I really hope you are feeling better soon. I help [facility] out with telemedicine visits on Tuesdays and Thursdays and am available for visits on those days. If you have any concerns or questions following this visit please schedule a follow up visit with your Primary Care doctor or seek care at a local urgent care clinic to avoid delays in care.    Seek in person care or schedule a follow up video visit promptly if your symptoms worsen, new concerns arise or you are not improving with treatment. Call 911 and/or seek emergency care if your symptoms are severe or life threatening.     Fact Sheet for Patients And Caregivers Emergency Use Authorization (EUA) Of  LAGEVRIOT (molnupiravir) capsules For Coronavirus Disease 2019 (COVID-19)  What is the most important information I should know about LAGEVRIO? LAGEVRIO may cause serious side effects, including: ? LAGEVRIO may cause harm to your unborn baby. It is not known if LAGEVRIO will harm your baby if you take LAGEVRIO during pregnancy. o LAGEVRIO is not recommended for use in pregnancy. o LAGEVRIO has not been studied in pregnancy. LAGEVRIO was studied in pregnant animals only. When LAGEVRIO was given to pregnant animals, LAGEVRIO caused harm to their unborn babies. o You and your healthcare provider may decide that you should take LAGEVRIO during pregnancy if there are no other COVID-19 treatment options approved or authorized by the FDA that are accessible or clinically appropriate for you. o If you and your healthcare provider decide that you should take LAGEVRIO during pregnancy, you and your healthcare provider should discuss the known and potential benefits and the potential risks of taking LAGEVRIO during pregnancy. For individuals who are able to become pregnant: ? You should use a reliable method of birth control (contraception) consistently and correctly during treatment with LAGEVRIO and for 4 days after the last dose of LAGEVRIO. Talk to your healthcare provider about reliable birth control methods. ? Before starting treatment with [facility] your healthcare provider may do a pregnancy test to see if you are pregnant before starting treatment with LAGEVRIO. ? Tell your healthcare provider right away if you become pregnant or think  you may be pregnant during treatment with LAGEVRIO. Pregnancy Surveillance Program: ? There is a pregnancy surveillance program for individuals who take LAGEVRIO during pregnancy. The purpose of this program is to collect information about the health of you and your baby. Talk to your healthcare provider about how to take part in this program. ? If you take  LAGEVRIO during pregnancy and you agree to participate in the pregnancy surveillance program and allow your healthcare provider to share your information with [facility], then your healthcare provider will report your use of [place] during pregnancy to [place]. by calling [phone] or [email or web address]. For individuals who are sexually active with partners who are able to become pregnant: ? It is not known if LAGEVRIO can affect sperm. While the risk is regarded as low, animal studies to fully assess the potential for LAGEVRIO to affect the babies of males treated with LAGEVRIO have not been completed. A reliable method of birth control (contraception) should be used consistently and correctly during treatment with LAGEVRIO and for at least 3 months after the last dose. The risk to sperm beyond 3 months is not known. Studies to understand the risk to sperm beyond 3 months are ongoing. Talk to your healthcare provider about reliable birth control methods. Talk to your healthcare provider if you have questions or concerns about how LAGEVRIO may affect sperm. You are being given this fact sheet because your healthcare provider believes it is necessary to provide you with LAGEVRIO for the treatment of adults with mild-to-moderate coronavirus disease 2019 (COVID-19) with positive results of direct SARS-CoV-2 viral testing, and who are at high risk for progression to severe COVID-19 including hospitalization or death, and for whom other COVID-19 treatment options approved or authorized by the FDA are not accessible or clinically appropriate. The U.S. Food and Drug Administration (FDA) has issued an Emergency Use Authorization (EUA) to make LAGEVRIO available during the COVID-19 pandemic (for more details about an EUA please see "What is an Emergency Use Authorization?" at the end of this document). LAGEVRIO is not an FDA-approved medicine in the [place]. Read this Fact Sheet for information about LAGEVRIO. Talk to your healthcare provider about your options if you have any questions. It is your choice to take LAGEVRIO.  What is COVID-19? COVID-19 is caused by a virus called a coronavirus. You can get COVID-19 through close contact with another person who has the virus. COVID-19 illnesses have ranged from very mild-to-severe, including illness resulting in death. While information so far suggests that most COVID-19 illness is mild, serious illness can happen and may cause some of your other medical conditions to become worse. Older people and people of all ages with severe, long lasting (chronic) medical conditions like heart disease, lung disease and diabetes, for example seem to be at higher risk of being hospitalized for COVID-19.  What is LAGEVRIO? LAGEVRIO is an investigational medicine used to treat mild-to-moderate COVID-19 in adults: ? with positive results of direct SARS-CoV-2 viral testing, and ? who are at high risk for progression to severe COVID-19 including hospitalization or death, and for whom other COVID-19 treatment options approved or authorized by the FDA are not accessible or clinically appropriate. The FDA has authorized the emergency use of LAGEVRIO for the treatment of mild-tomoderate COVID-19 in adults under an EUA. For more information on EUA, see the "What is an Emergency Use Authorization (EUA)?" section at the end of this Fact Sheet. LAGEVRIO is not authorized: ?  for use in people less than 66 years of age. ? for prevention of COVID-19. ? for people needing hospitalization for COVID-19. ? for use for longer than 5 consecutive days.  What should I tell my healthcare provider before I take LAGEVRIO? Tell your healthcare provider if you: ? Have any allergies ? Are breastfeeding or plan to breastfeed ? Have any serious illnesses ? Are taking any medicines (prescription, over-the-counter, vitamins, or  herbal products).  How do I take LAGEVRIO? ? Take LAGEVRIO exactly as your healthcare provider tells you to take it. ? Take 4 capsules of LAGEVRIO every 12 hours (for example, at 8 am and at 8 pm) ? Take LAGEVRIO for 5 days. It is important that you complete the full 5 days of treatment with LAGEVRIO. Do not stop taking LAGEVRIO before you complete the full 5 days of treatment, even if you feel better. ? Take LAGEVRIO with or without food. ? You should stay in isolation for as long as your healthcare provider tells you to. Talk to your healthcare provider if you are not sure about how to properly isolate while you have COVID-19. ? Swallow LAGEVRIO capsules whole. Do not open, break, or crush the capsules. If you cannot swallow capsules whole, tell your healthcare provider. ? What to do if you miss a dose: o If it has been less than 10 hours since the missed dose, take it as soon as you remember o If it has been more than 10 hours since the missed dose, skip the missed dose and take your dose at the next scheduled time. ? Do not double the dose of LAGEVRIO to make up for a missed dose.  What are the important possible side effects of LAGEVRIO? ? See, "What is the most important information I should know about LAGEVRIO?" ? Allergic Reactions. Allergic reactions can happen in people taking LAGEVRIO, even after only 1 dose. Stop taking LAGEVRIO and call your healthcare provider right away if you get any of the following symptoms of an allergic reaction: o hives o rapid heartbeat o trouble swallowing or breathing o swelling of the mouth, lips, or face o throat tightness o hoarseness o skin rash The most common side effects of LAGEVRIO are: ? diarrhea ? nausea ? dizziness These are not all the possible side effects of LAGEVRIO. Not many people have taken LAGEVRIO. Serious and unexpected side effects may happen. This medicine is still being studied, so it is possible that all of the  risks are not known at this time.  What other treatment choices are there?  Veklury (remdesivir) is FDA-approved as an intravenous (IV) infusion for the treatment of mildto-moderate DJMEQ-68 in certain adults and children. Talk with your doctor to see if Marijean Heath is appropriate for you. Like LAGEVRIO, FDA may also allow for the emergency use of other medicines to treat people with COVID-19. Go to LacrosseProperties.si for more information. It is your choice to be treated or not to be treated with LAGEVRIO. Should you decide not to take it, it will not change your standard medical care.  What if I am breastfeeding? Breastfeeding is not recommended during treatment with LAGEVRIO and for 4 days after the last dose of LAGEVRIO. If you are breastfeeding or plan to breastfeed, talk to your healthcare provider about your options and specific situation before taking LAGEVRIO.  How do I report side effects with LAGEVRIO? Contact your healthcare provider if you have any side effects that bother you or do not go away.  Report side effects to FDA MedWatch at SmoothHits.hu or call 1-800-FDA-1088 (1- 331-745-7601).  How should I store [place]? ? Store LAGEVRIO capsules at room temperature between 41F to 36F (20C to 25C). ? Keep LAGEVRIO and all medicines out of the reach of children and pets. How can I learn more about COVID-19? ? Ask your healthcare provider. ? Visit SeekRooms.co.uk ? Contact your local or state public health department. ? Call [place] at [phone] (toll free in the [place] ? Visit www.molnupiravir.com  What Is an Emergency Use Authorization (EUA)? The [place] FDA has made [place] available under an emergency access mechanism called an Emergency Use Authorization (EUA) The EUA is supported by a [place], broadcasting Health and Human Service (HHS)  declaration that circumstances exist to justify emergency use of drugs and biological products during the COVID-19 pandemic. LAGEVRIO for the treatment of mild-to-moderate COVID-19 in adults with positive results of direct SARS-CoV-2 viral testing, who are at high risk for progression to severe COVID-19, including hospitalization or death, and for whom alternative COVID-19 treatment options approved or authorized by FDA are not accessible or clinically appropriate, has not undergone the same type of review as an FDA-approved product. In issuing an EUA under the OILNZ-97 public health emergency, the FDA has determined, among other things, that based on the total amount of scientific evidence available including data from adequate and well-controlled clinical trials, if available, it is reasonable to believe that the product may be effective for diagnosing, treating, or preventing COVID-19, or a serious or life-threatening disease or condition caused by COVID-19; that the known and potential benefits of the product, when used to diagnose, treat, or prevent such disease or condition, outweigh the known and potential risks of such product; and that there are no adequate, approved, and available alternatives.  All of these criteria must be met to allow for the product to be used in the treatment of patients during the COVID-19 pandemic. The EUA for LAGEVRIO is in effect for the duration of the COVID-19 declaration justifying emergency use of LAGEVRIO, unless terminated or revoked (after which LAGEVRIO may no longer be used under the EUA). For patent information: http://rogers.info/ Copyright  2021-2022 [employer] [place], [place] and its affiliates. All rights reserved. [id number] Revised: March 2022

## 2020-11-03 NOTE — Progress Notes (Signed)
Virtual Visit via Telephone Note  I connected with Jose Bonilla on 11/03/20 at 11:40 AM EDT by telephone and verified that I am speaking with the correct person using two identifiers.   I discussed the limitations, risks, security and privacy concerns of performing an evaluation and management service by telephone and the availability of in person appointments. I also discussed with the patient that there may be a patient responsible charge related to this service. The patient expressed understanding and agreed to proceed.  Location patient: home, [place] Location provider: work or home office Participants present for the call: patient, provider Patient did not have a visit with me in the prior 7 days to address this/these issue(s).   History of Present Illness:  Acute telemedicine visit for Covid19: -Onset: 2 days ago; positive test yesterday -Symptoms include: nasal congestion, cough, headache, reports his O2 is about 98-99 -Denies:fever, CP, SOB, NVD, inability to eat/drink/get out of bed -Pertinent past medical history:see below -Pertinent medication allergies: Allergies  Allergen Reactions   Indomethacin Other (See Comments)    Increased heart rate and elevated BP per patient  -COVID-19 vaccine status:2 doses and a booster -no recent labs for renal check  Past Medical History:  Diagnosis Date   Chronic atrial fibrillation (HCC)    Coumadin therapy   CVA (cerebral infarction)    Possible history   Essential hypertension    Gout    Overweight(278.02)    Seasonal allergies    Secondary cardiomyopathy ([place])    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 I do not appreciate any SOB. Speech and thought processing are grossly intact. Patient reported vitals:  Assessment and Plan:  COVID-19   Discussed treatment options (infusions and oral options and risk of drug interactions), ideal treatment window, potential  complications, isolation and precautions for COVID-19.  Discussed possibility of rebound with antivirals and the need to reisolate if it should occur for 5 days. Checked for/reviewed any labs done in the last 90 days with GFR listed in HPI if available. After lengthy discussion, the patient opted for treatment with molnupiravir due to being higher risk for complications of covid or severe disease and other factors. Discussed EUA status of this drug and the fact that there is preliminary limited knowledge of risks/interactions/side effects per EUA document vs possible benefits and precautions. This information was shared with patient during the visit and also was provided in patient instructions. Also, advised that patient discuss risks/interactions and use with pharmacist/treatment team as well. The patient did want a prescription for cough, Tessalon Rx sent.  Other symptomatic care measures summarized in patient instructions. Advised to seek prompt in person care if worsening, new symptoms arise, or if is not improving with treatment. Advised of options for inperson care in case PCP office not available. Did let the patient know that I only do telemedicine shifts for [place] on Tuesdays and Thursdays and advised a follow up visit with PCP or at an [facility] if has further questions or concerns.   Follow Up Instructions:  I did not refer this patient for an OV with me in the next 24 hours for this/these issue(s).  I discussed the assessment and treatment plan with the patient. The patient was provided an opportunity to ask questions and all were answered. The patient agreed with the plan and demonstrated an understanding of the instructions.   I spent 14 minutes on the date of this visit in the  care of this patient. See summary of tasks completed to properly care for this patient in the detailed notes above which also included counseling of above, review of PMH, medications, allergies, evaluation of the patient  and ordering and/or  instructing patient on testing and care options.     Lucretia Kern, DO

## 2020-11-22 ENCOUNTER — Telehealth: Payer: Self-pay

## 2020-11-22 NOTE — Telephone Encounter (Signed)
Pt NS apt.  LVM for pt to call to RS coumadin clinic apt

## 2020-11-22 NOTE — Telephone Encounter (Signed)
Pt returned call. Apt has been RS

## 2020-11-23 DIAGNOSIS — U071 COVID-19: Secondary | ICD-10-CM | POA: Diagnosis not present

## 2020-11-30 ENCOUNTER — Other Ambulatory Visit: Payer: Self-pay

## 2020-11-30 ENCOUNTER — Ambulatory Visit (INDEPENDENT_AMBULATORY_CARE_PROVIDER_SITE_OTHER): Payer: Medicare Other

## 2020-11-30 ENCOUNTER — Telehealth: Payer: Self-pay | Admitting: Family Medicine

## 2020-11-30 DIAGNOSIS — Z7901 Long term (current) use of anticoagulants: Secondary | ICD-10-CM | POA: Diagnosis not present

## 2020-11-30 LAB — POCT INR: INR: 2.7 (ref 2.0–3.0)

## 2020-11-30 NOTE — Patient Instructions (Addendum)
Pre visit review using our clinic review tool, if applicable. No additional management support is needed unless otherwise documented below in the visit note.  Continue to take 1 tablet daily except 2 tablets on Saturdays.  Re-check in 4 weeks.

## 2020-11-30 NOTE — Telephone Encounter (Signed)
Left message for patient to call back and schedule Medicare Annual Wellness Visit (AWV) either virtually or in office. Left  my Herbie Drape number [phone]   Last AWV ;12/18/19  please schedule at anytime with [facility] Nurse Health Advisor 1 or 2   This should be a 45 minute visit.

## 2020-11-30 NOTE — Telephone Encounter (Signed)
Correction Patient didn't answer could not leave message

## 2020-12-07 ENCOUNTER — Other Ambulatory Visit: Payer: Self-pay | Admitting: Family Medicine

## 2020-12-07 DIAGNOSIS — Z7901 Long term (current) use of anticoagulants: Secondary | ICD-10-CM

## 2020-12-08 NOTE — Telephone Encounter (Signed)
Pt compliant with warfarin management. Sent in refill. 

## 2020-12-13 ENCOUNTER — Telehealth: Payer: Self-pay | Admitting: Pharmacist

## 2020-12-13 NOTE — Chronic Care Management (AMB) (Signed)
Chronic Care Management Pharmacy Assistant   Name: Jose Bonilla  MRN: [id number] DOB: [birth year]  Reason for Encounter: Disease State / Hypertension Assessment Call   Conditions to be addressed/monitored: HTN  Recent office visits:  11/03/2020 Colin Benton DO (PCP) - Patient was seen for Covid 19. Started on Molnupiravir 800 mg  twice daily, Discontinued Doxycylcine 100 mg. No follow up noted.  Recent consult visits:  10/13/2020 Levell July NP (cardiology) - Patient was seen for permanent atrial fibrillation and additional issues. No medication changes. Follow up in 6 months.  Hospital visits:  None  Medications: Outpatient Encounter Medications as of 12/13/2020  Medication Sig   allopurinol (ZYLOPRIM) 300 MG tablet Take 1 tablet by mouth once daily   atenolol (TENORMIN) 100 MG tablet Take 1/2 (one-half) tablet by mouth twice daily   diltiazem (CARDIZEM CD) 180 MG 24 hr capsule Take 1 capsule by mouth once daily   ibuprofen (ADVIL) 200 MG tablet Take 200 mg by mouth every 6 (six) hours as needed.   lisinopril (ZESTRIL) 20 MG tablet Take 1 tablet by mouth once daily   Multiple Vitamin (MULTIVITAMIN) capsule Take 1 capsule by mouth daily.   warfarin (COUMADIN) 2.5 MG tablet TAKE 1 TABLET BY MOUTH ONCE DAILY EXCEPT  TAKE  2  TABLETS ON  SATURDAYS  OR  AS  DIRECTED  BY  ANTICOAGULATION  CLINIC   No facility-administered encounter medications on file as of 12/13/2020.   Fill History:  ALLOPURINOL 300MG    TAB 10/26/2020 90   ATENOLOL 100MG       TAB 10/26/2020 90   LISINOPRIL 20MG      TAB 12/08/2020 90   WARFARIN 2.5MG       TAB 08/23/2020 93   DILTIAZEM ER180MG    CAP 10/25/2020 90   Reviewed chart prior to disease state call. Spoke with patient regarding BP  Recent Office Vitals: BP Readings from Last 3 Encounters:  10/13/20 (!) 152/88  06/20/20 (!) 150/98  01/22/20 (!) 150/86   Pulse Readings from Last 3 Encounters:  10/13/20 92  06/20/20 90  01/22/20 84    Wt  Readings from Last 3 Encounters:  10/13/20 206 lb 9.6 oz (93.7 kg)  06/20/20 218 lb 1.6 oz (98.9 kg)  01/22/20 228 lb 6.4 oz (103.6 kg)     Kidney Function Lab Results  Component Value Date/Time   CREATININE 1.13 11/15/2017 09:18 AM   CREATININE 1.35 (H) 12/28/2016 10:00 PM   GFR 67.49 11/15/2017 09:18 AM   GFRNONAA 51 (L) 12/28/2016 10:00 PM   GFRAA 59 (L) 12/28/2016 10:00 PM    BMP Latest Ref Rng & Units 11/15/2017 12/28/2016 12/17/2016  Glucose 70 - 99 mg/dL 100(H) 129(H) -  BUN 6 - 23 mg/dL 24(H) 20 -  Creatinine 0.40 - 1.50 mg/dL 1.13 1.35(H) 1.00  Sodium 135 - 145 mEq/L 142 136 -  Potassium 3.5 - 5.1 mEq/L 4.5 4.3 -  Chloride 96 - 112 mEq/L 106 105 -  CO2 19 - 32 mEq/L 28 21(L) -  Calcium 8.4 - 10.5 mg/dL 9.2 8.8(L) -    Current antihypertensive regimen:  Atenolol 100 mg 1/2 tablet twice daily Lisinopril 20 mg daily  How often are you checking your Blood Pressure? 3-5x per week  Current home BP readings: between 130-140/80-90  What recent interventions/DTPs have been made by any provider to improve Blood Pressure control since last CPP Visit: None  Any recent hospitalizations or ED visits since last visit with CPP? No  What diet changes  have been made to improve Blood Pressure Control?  Patient has recently started eating very low carbohydrate way of eating. Once a week he will treat himself to a burger. For breakfast he will have eggs and bacon, sometimes some green beans with a dab of mayo. For dinner he will have either steak, fish or chicken with a vegetable.  Patient states he has lost a few pounds, he is now at 098 with a goal of 180.  What exercise is being done to improve your Blood Pressure Control?  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   Adherence Review: Is the patient currently on ACE/ARB medication? Yes Does the patient have >5 day gap between last estimated fill dates? No  Time spent with patient on the phone 32  minutes.  Care Gaps: AWV - message sent to [name] to schedule Last BP - 152/88 on 10/13/2020 Pneumovax - never done TDAP - never done Covid vaccine - overdue Flu - due  Star Rating Drugs: Lisinopril 20 mg - last filled 12/08/2020 90DS at [facility] Pharmacist Assistant [phone]

## 2020-12-23 ENCOUNTER — Ambulatory Visit: Payer: Medicare Other

## 2020-12-28 ENCOUNTER — Ambulatory Visit: Payer: Medicare Other

## 2021-01-03 ENCOUNTER — Telehealth: Payer: Self-pay

## 2021-01-03 ENCOUNTER — Ambulatory Visit (INDEPENDENT_AMBULATORY_CARE_PROVIDER_SITE_OTHER): Payer: Medicare Other

## 2021-01-03 VITALS — Ht 68.0 in | Wt 197.0 lb

## 2021-01-03 DIAGNOSIS — Z0000 Encounter for general adult medical examination without abnormal findings: Secondary | ICD-10-CM | POA: Diagnosis not present

## 2021-01-03 NOTE — Telephone Encounter (Signed)
This nurse called patient in regards to missed AWV appointment. Message left that we will call to reschedule for another time.

## 2021-01-03 NOTE — Progress Notes (Signed)
I connected with Jose Bonilla today by telephone and verified that I am speaking with the correct person using two identifiers. Location patient: home Location provider: work Persons participating in the virtual visit: Danarius Mcconathy, Glenna Durand LPN.   I discussed the limitations, risks, security and privacy concerns of performing an evaluation and management service by telephone and the availability of in person appointments. I also discussed with the patient that there may be a patient responsible charge related to this service. The patient expressed understanding and verbally consented to this telephonic visit.    Interactive audio and video telecommunications were attempted between this provider and patient, however failed, due to patient having technical difficulties OR patient did not have access to video capability.  We continued and completed visit with audio only.     Vital signs may be patient reported or missing.  Subjective:   Jose Bonilla is a 75 y.o. male who presents for Medicare Annual/Subsequent preventive examination.  Review of Systems     Cardiac Risk Factors include: advanced age (>33men, >25 women);hypertension;male gender     Objective:    Today's Vitals   01/03/21 1608  Weight: 197 lb (89.4 kg)  Height: 5\' 8"  (1.727 m)   Body mass index is 29.95 kg/m.  Advanced Directives 01/03/2021 12/18/2019 03/14/2016  Does Patient Have a Medical Advance Directive? No No No  Would patient like information on creating a medical advance directive? - No - Patient declined -    Current Medications (verified) Outpatient Encounter Medications as of 01/03/2021  Medication Sig   allopurinol (ZYLOPRIM) 300 MG tablet Take 1 tablet by mouth once daily   atenolol (TENORMIN) 100 MG tablet Take 1/2 (one-half) tablet by mouth twice daily   diltiazem (CARDIZEM CD) 180 MG 24 hr capsule Take 1 capsule by mouth once daily   ibuprofen (ADVIL) 200 MG tablet Take 200 mg by mouth every  6 (six) hours as needed.   lisinopril (ZESTRIL) 20 MG tablet Take 1 tablet by mouth once daily   Multiple Vitamin (MULTIVITAMIN) capsule Take 1 capsule by mouth daily.   warfarin (COUMADIN) 2.5 MG tablet TAKE 1 TABLET BY MOUTH ONCE DAILY EXCEPT  TAKE  2  TABLETS ON  SATURDAYS  OR  AS  DIRECTED  BY  ANTICOAGULATION  CLINIC   No facility-administered encounter medications on file as of 01/03/2021.    Allergies (verified) Indomethacin   History: Past Medical History:  Diagnosis Date   Chronic atrial fibrillation (HCC)    Coumadin therapy   CVA (cerebral infarction)    Possible history   Essential hypertension    Gout    Overweight(278.02)    Seasonal allergies    Secondary cardiomyopathy ([place])    LVEF 40-45% June 2013   History reviewed. No pertinent surgical history. Family History  Problem Relation Age of Onset   Heart disease Mother    Hypertension Father    Diabetes Paternal Aunt    Social History   Socioeconomic History   Marital status: Married    Spouse name: Not on file   Number of children: Not on file   Years of education: Not on file   Highest education level: Not on file  Occupational History   Not on file  Tobacco Use   Smoking status: Never   Smokeless tobacco: Never  Vaping Use   Vaping Use: Never used  Substance and Sexual Activity   Alcohol use: No    Alcohol/week: 0.0 standard drinks    Comment: no  use    Drug use: No   Sexual activity: Not on file  Other Topics Concern   Not on file  Social History Narrative   Lives with wife   Married x 17 years   Children; no children   Hobbies; garden   Social Determinants of Radio broadcast assistant Strain: Low Risk    Difficulty of Paying Living Expenses: Not hard at all  Food Insecurity: No Food Insecurity   Worried About Charity fundraiser in the Last Year: Never true   Arboriculturist in the Last Year: Never true  Transportation Needs: No Transportation Needs   Lack of Transportation  (Medical): No   Lack of Transportation (Non-Medical): No  Physical Activity: Inactive   Days of Exercise per Week: 0 days   Minutes of Exercise per Session: 0 min  Stress: No Stress Concern Present   Feeling of Stress : Not at all  Social Connections: Not on file    Tobacco Counseling Counseling given: Not Answered   Clinical Intake:  Pre-visit preparation completed: Yes  Pain : No/denies pain     Nutritional Status: BMI 25 -29 Overweight Nutritional Risks: None Diabetes: No  How often do you need to have someone help you when you read instructions, pamphlets, or other written materials from your doctor or pharmacy?: 1 - Never What is the last grade level you completed in school?: college  Diabetic? no  Interpreter Needed?: No  Information entered by :: NAllen LPN   Activities of Daily Living In your present state of health, do you have any difficulty performing the following activities: 01/03/2021  Hearing? N  Vision? N  Difficulty concentrating or making decisions? N  Walking or climbing stairs? N  Dressing or bathing? N  Doing errands, shopping? N  Preparing Food and eating ? N  Using the Toilet? N  In the past six months, have you accidently leaked urine? N  Do you have problems with loss of bowel control? N  Managing your Medications? N  Managing your Finances? N  Housekeeping or managing your Housekeeping? N  Some recent data might be hidden    Patient Care Team: [name], MD as PCP - General (Family Medicine) Satira Sark, MD as PCP - Cardiology (Cardiology) Viona Gilmore, [facility] as Pharmacist (Pharmacist)  Indicate any recent Medical Services you may have received from other than [facility] providers in the past year (date may be approximate).     Assessment:   This is a routine wellness examination for Jose Bonilla.  Hearing/Vision screen Vision Screening - Comments:: No regular eye exams  Dietary issues and exercise activities  discussed: Current Exercise Habits: The patient does not participate in regular exercise at present   Goals Addressed             This Visit's Progress    Patient Stated       01/03/2021, lose weight       Depression Screen PHQ 2/9 Scores 01/03/2021 12/18/2019 11/15/2017 03/14/2016 08/16/2014  PHQ - 2 Score 0 0 0 0 0  PHQ- 9 Score - 0 - - -    Fall Risk Fall Risk  01/03/2021 12/18/2019 11/15/2017 03/14/2016 09/30/2015  Falls in the past year? 0 1 No No No  Comment - fell out of the car while car was backing up - - Emmi Telephone Survey: data to providers prior to load  Number falls in past yr: - 0 - - -  Injury  with Fall? - 1 - - -  Comment - had a concussion - - -  Risk for fall due to : Medication side effect No Fall Risks - - -  Follow up Falls evaluation completed;Education provided;Falls prevention discussed Falls evaluation completed;Falls prevention discussed - - -    FALL RISK PREVENTION PERTAINING TO THE HOME:  Any stairs in or around the home? No  If so, are there any without handrails? N/a Home free of loose throw rugs in walkways, pet beds, electrical cords, etc? Yes  Adequate lighting in your home to reduce risk of falls? Yes   ASSISTIVE DEVICES UTILIZED TO PREVENT FALLS:  Life alert? No  Use of a cane, Dry or w/c? No  Grab bars in the bathroom? Yes  Shower chair or bench in shower? No  Elevated toilet seat or a handicapped toilet? No   TIMED UP AND GO:  Was the test performed? No .  .     Cognitive Function: MMSE - Mini Mental State Exam 03/14/2016  Not completed: (No Data)     6CIT Screen 01/03/2021  What Year? 0 points  What month? 0 points  What time? 0 points  Count back from 20 0 points  Months in reverse 0 points  Repeat phrase 0 points  Total Score 0    Immunizations Immunization History  Administered Date(s) Administered   Moderna SARS-COV2 Booster Vaccination 02/02/2020   Moderna Sars-Covid-2 Vaccination 03/21/2019, 04/18/2019     TDAP status: Due, Education has been provided regarding the importance of this vaccine. Advised may receive this vaccine at local pharmacy or Health Dept. Aware to provide a copy of the vaccination record if obtained from local pharmacy or Health Dept. Verbalized acceptance and understanding.  Flu Vaccine status: Declined, Education has been provided regarding the importance of this vaccine but patient still declined. Advised may receive this vaccine at local pharmacy or Health Dept. Aware to provide a copy of the vaccination record if obtained from local pharmacy or Health Dept. Verbalized acceptance and understanding.  Pneumococcal vaccine status: Declined,  Education has been provided regarding the importance of this vaccine but patient still declined. Advised may receive this vaccine at local pharmacy or Health Dept. Aware to provide a copy of the vaccination record if obtained from local pharmacy or Health Dept. Verbalized acceptance and understanding.   Covid-19 vaccine status: Completed vaccines  Qualifies for Shingles Vaccine? Yes   Zostavax completed No   Shingrix Completed?: No.    Education has been provided regarding the importance of this vaccine. Patient has been advised to call insurance company to determine out of pocket expense if they have not yet received this vaccine. Advised may also receive vaccine at local pharmacy or Health Dept. Verbalized acceptance and understanding.  Screening Tests Health Maintenance  Topic Date Due   TETANUS/TDAP  Never done   Zoster Vaccines- Shingrix (1 of 2) Never done   COVID-19 Vaccine (3 - Moderna risk series) 03/01/2020   INFLUENZA VACCINE  05/05/2021 (Originally 09/05/2020)   Pneumonia Vaccine 32+ Years old (1 - PCV) 01/03/2022 (Originally 03/29/1951)   Hepatitis C Screening  Completed   HPV VACCINES  Aged Out   COLONOSCOPY (Pts 45-34yrs Insurance coverage will need to be confirmed)  Discontinued    Health Maintenance  Health  Maintenance Due  Topic Date Due   TETANUS/TDAP  Never done   Zoster Vaccines- Shingrix (1 of 2) Never done   COVID-19 Vaccine (3 - Moderna risk series) 03/01/2020  Colorectal cancer screening: No longer required.   Lung Cancer Screening: (Low Dose CT Chest recommended if Age 17-80 years, 30 pack-year currently smoking OR have quit w/in 15years.) does not qualify.   Lung Cancer Screening Referral: no  Additional Screening:  Hepatitis C Screening: does qualify; Completed 11/15/2017  Vision Screening: Recommended annual ophthalmology exams for early detection of glaucoma and other disorders of the eye. Is the patient up to date with their annual eye exam?  No  Who is the provider or what is the name of the office in which the patient attends annual eye exams? none If pt is not established with a provider, would they like to be referred to a provider to establish care? No .   Dental Screening: Recommended annual dental exams for proper oral hygiene  Community Resource Referral / Chronic Care Management: CRR required this visit?  No   CCM required this visit?  No      Plan:     I have personally reviewed and noted the following in the patient's chart:   Medical and social history Use of alcohol, tobacco or illicit drugs  Current medications and supplements including opioid prescriptions. Patient is not currently taking opioid prescriptions. Functional ability and status Nutritional status Physical activity Advanced directives List of other physicians Hospitalizations, surgeries, and ER visits in previous 12 months Vitals Screenings to include cognitive, depression, and falls Referrals and appointments  In addition, I have reviewed and discussed with patient certain preventive protocols, quality metrics, and best practice recommendations. A written personalized care plan for preventive services as well as general preventive health recommendations were provided to  patient.     Kellie Simmering, LPN   [id number]   Nurse Notes: none

## 2021-01-03 NOTE — Patient Instructions (Signed)
[name] , Thank you for taking time to come for your Medicare Wellness Visit. I appreciate your ongoing commitment to your health goals. Please review the following plan we discussed and let me know if I can assist you in the future.   Screening recommendations/referrals: Colonoscopy: not required Recommended yearly ophthalmology/optometry visit for glaucoma screening and checkup Recommended yearly dental visit for hygiene and checkup  Vaccinations: Influenza vaccine: decline Pneumococcal vaccine: decline Tdap vaccine: due Shingles vaccine: discussed   Covid-19:  02/02/2020, 04/18/2019, 03/21/2019  Advanced directives: Advance directive discussed with you today.   Conditions/risks identified: none  Next appointment: Follow up in one year for your annual wellness visit.   Preventive Care 70 Years and Older, Male Preventive care refers to lifestyle choices and visits with your health care provider that can promote health and wellness. What does preventive care include? A yearly physical exam. This is also called an annual well check. Dental exams once or twice a year. Routine eye exams. Ask your health care provider how often you should have your eyes checked. Personal lifestyle choices, including: Daily care of your teeth and gums. Regular physical activity. Eating a healthy diet. Avoiding tobacco and drug use. Limiting alcohol use. Practicing safe sex. Taking low doses of aspirin every day. Taking vitamin and mineral supplements as recommended by your health care provider. What happens during an annual well check? The services and screenings done by your health care provider during your annual well check will depend on your age, overall health, lifestyle risk factors, and family history of disease. Counseling  Your health care provider may ask you questions about your: Alcohol use. Tobacco use. Drug use. Emotional well-being. Home and relationship well-being. Sexual  activity. Eating habits. History of falls. Memory and ability to understand (cognition). Work and work Statistician. Screening  You may have the following tests or measurements: Height, weight, and BMI. Blood pressure. Lipid and cholesterol levels. These may be checked every 5 years, or more frequently if you are over 45 years old. Skin check. Lung cancer screening. You may have this screening every year starting at age 20 if you have a 30-pack-year history of smoking and currently smoke or have quit within the past 15 years. Fecal occult blood test (FOBT) of the stool. You may have this test every year starting at age 39. Flexible sigmoidoscopy or colonoscopy. You may have a sigmoidoscopy every 5 years or a colonoscopy every 10 years starting at age 29. Prostate cancer screening. Recommendations will vary depending on your family history and other risks. Hepatitis C blood test. Hepatitis B blood test. Sexually transmitted disease (STD) testing. Diabetes screening. This is done by checking your blood sugar (glucose) after you have not eaten for a while (fasting). You may have this done every 1-3 years. Abdominal aortic aneurysm (AAA) screening. You may need this if you are a current or former smoker. Osteoporosis. You may be screened starting at age 26 if you are at high risk. Talk with your health care provider about your test results, treatment options, and if necessary, the need for more tests. Vaccines  Your health care provider may recommend certain vaccines, such as: Influenza vaccine. This is recommended every year. Tetanus, diphtheria, and acellular pertussis (Tdap, Td) vaccine. You may need a Td booster every 10 years. Zoster vaccine. You may need this after age 35. Pneumococcal 13-valent conjugate (PCV13) vaccine. One dose is recommended after age 86. Pneumococcal polysaccharide (PPSV23) vaccine. One dose is recommended after age 65. Talk to  your health care provider about which  screenings and vaccines you need and how often you need them. This information is not intended to replace advice given to you by your health care provider. Make sure you discuss any questions you have with your health care provider. Document Released: 02/18/2015 Document Revised: 10/12/2015 Document Reviewed: 11/23/2014 Elsevier Interactive Patient Education  2017 [place] Prevention in the Home Falls can cause injuries. They can happen to people of all ages. There are many things you can do to make your home safe and to help prevent falls. What can I do on the outside of my home? Regularly fix the edges of walkways and driveways and fix any cracks. Remove anything that might make you trip as you walk through a door, such as a raised step or threshold. Trim any bushes or trees on the path to your home. Use bright outdoor lighting. Clear any walking paths of anything that might make someone trip, such as rocks or tools. Regularly check to see if handrails are loose or broken. Make sure that both sides of any steps have handrails. Any raised decks and porches should have guardrails on the edges. Have any leaves, snow, or ice cleared regularly. Use sand or salt on walking paths during winter. Clean up any spills in your garage right away. This includes oil or grease spills. What can I do in the bathroom? Use night lights. Install grab bars by the toilet and in the tub and shower. Do not use towel bars as grab bars. Use non-skid mats or decals in the tub or shower. If you need to sit down in the shower, use a plastic, non-slip stool. Keep the floor dry. Clean up any water that spills on the floor as soon as it happens. Remove soap buildup in the tub or shower regularly. Attach bath mats securely with double-sided non-slip rug tape. Do not have throw rugs and other things on the floor that can make you trip. What can I do in the bedroom? Use night lights. Make sure that you have a  light by your bed that is easy to reach. Do not use any sheets or blankets that are too big for your bed. They should not hang down onto the floor. Have a firm chair that has side arms. You can use this for support while you get dressed. Do not have throw rugs and other things on the floor that can make you trip. What can I do in the kitchen? Clean up any spills right away. Avoid walking on wet floors. Keep items that you use a lot in easy-to-reach places. If you need to reach something above you, use a strong step stool that has a grab bar. Keep electrical cords out of the way. Do not use floor polish or wax that makes floors slippery. If you must use wax, use non-skid floor wax. Do not have throw rugs and other things on the floor that can make you trip. What can I do with my stairs? Do not leave any items on the stairs. Make sure that there are handrails on both sides of the stairs and use them. Fix handrails that are broken or loose. Make sure that handrails are as long as the stairways. Check any carpeting to make sure that it is firmly attached to the stairs. Fix any carpet that is loose or worn. Avoid having throw rugs at the top or bottom of the stairs. If you do have throw rugs, attach  them to the floor with carpet tape. Make sure that you have a light switch at the top of the stairs and the bottom of the stairs. If you do not have them, ask someone to add them for you. What else can I do to help prevent falls? Wear shoes that: Do not have high heels. Have rubber bottoms. Are comfortable and fit you well. Are closed at the toe. Do not wear sandals. If you use a stepladder: Make sure that it is fully opened. Do not climb a closed stepladder. Make sure that both sides of the stepladder are locked into place. Ask someone to hold it for you, if possible. Clearly mark and make sure that you can see: Any grab bars or handrails. First and last steps. Where the edge of each step  is. Use tools that help you move around (mobility aids) if they are needed. These include: Canes. Walkers. Scooters. Crutches. Turn on the lights when you go into a dark area. Replace any light bulbs as soon as they burn out. Set up your furniture so you have a clear path. Avoid moving your furniture around. If any of your floors are uneven, fix them. If there are any pets around you, be aware of where they are. Review your medicines with your doctor. Some medicines can make you feel dizzy. This can increase your chance of falling. Ask your doctor what other things that you can do to help prevent falls. This information is not intended to replace advice given to you by your health care provider. Make sure you discuss any questions you have with your health care provider. Document Released: 11/18/2008 Document Revised: 06/30/2015 Document Reviewed: 02/26/2014 Elsevier Interactive Patient Education  2017 [employer].

## 2021-01-04 ENCOUNTER — Ambulatory Visit (INDEPENDENT_AMBULATORY_CARE_PROVIDER_SITE_OTHER): Payer: Medicare Other

## 2021-01-04 DIAGNOSIS — Z7901 Long term (current) use of anticoagulants: Secondary | ICD-10-CM

## 2021-01-04 LAB — POCT INR: INR: 1.8 — AB (ref 2.0–3.0)

## 2021-01-04 NOTE — Progress Notes (Signed)
Take 2 tablets today and then continue to take 1 tablet daily except 2 tablets on Saturdays.  Re-check in 5 weeks.

## 2021-01-04 NOTE — Patient Instructions (Addendum)
Pre visit review using our clinic review tool, if applicable. No additional management support is needed unless otherwise documented below in the visit note.  Take 2 tablets today and then continue to take 1 tablet daily except 2 tablets on Saturdays.  Re-check in 5 weeks.

## 2021-02-07 ENCOUNTER — Other Ambulatory Visit: Payer: Self-pay | Admitting: Family Medicine

## 2021-02-07 DIAGNOSIS — I10 Essential (primary) hypertension: Secondary | ICD-10-CM

## 2021-02-07 DIAGNOSIS — M1A00X0 Idiopathic chronic gout, unspecified site, without tophus (tophi): Secondary | ICD-10-CM

## 2021-02-08 ENCOUNTER — Ambulatory Visit (INDEPENDENT_AMBULATORY_CARE_PROVIDER_SITE_OTHER): Payer: Medicare Other

## 2021-02-08 DIAGNOSIS — Z7901 Long term (current) use of anticoagulants: Secondary | ICD-10-CM

## 2021-02-08 LAB — POCT INR: INR: 1.8 — AB (ref 2.0–3.0)

## 2021-02-08 NOTE — Progress Notes (Signed)
Change weekly dose to take 1 tablet daily except take 1 1/2 tablets on Mon, Wed, and Fri. Recheck in 5 weeks per pt request.

## 2021-02-08 NOTE — Patient Instructions (Addendum)
Pre visit review using our clinic review tool, if applicable. No additional management support is needed unless otherwise documented below in the visit note.  Change weekly dose to take 1 tablet daily except take 1 1/2 tablets on Mon, Wed, and Fri. Recheck in 5 weeks per pt request.

## 2021-02-08 NOTE — Telephone Encounter (Signed)
Last OV 06/10/20 for a tick bite. Appt needed for future refills. Pt notified of this & will call back to schedule. Pt notified that meds will be refilled for 53mo only & appt will need to be made. Pt verb understanding.

## 2021-02-28 ENCOUNTER — Ambulatory Visit: Payer: Medicare Other | Admitting: Family Medicine

## 2021-03-01 ENCOUNTER — Telehealth: Payer: Medicare Other

## 2021-03-15 ENCOUNTER — Other Ambulatory Visit: Payer: Self-pay

## 2021-03-15 ENCOUNTER — Ambulatory Visit (INDEPENDENT_AMBULATORY_CARE_PROVIDER_SITE_OTHER): Payer: Medicare Other

## 2021-03-15 ENCOUNTER — Ambulatory Visit (INDEPENDENT_AMBULATORY_CARE_PROVIDER_SITE_OTHER): Payer: Medicare Other | Admitting: Family Medicine

## 2021-03-15 VITALS — BP 160/100 | HR 59 | Temp 97.4°F | Wt 202.3 lb

## 2021-03-15 DIAGNOSIS — Z7901 Long term (current) use of anticoagulants: Secondary | ICD-10-CM | POA: Diagnosis not present

## 2021-03-15 DIAGNOSIS — Z79899 Other long term (current) drug therapy: Secondary | ICD-10-CM | POA: Diagnosis not present

## 2021-03-15 DIAGNOSIS — I10 Essential (primary) hypertension: Secondary | ICD-10-CM | POA: Diagnosis not present

## 2021-03-15 DIAGNOSIS — I4891 Unspecified atrial fibrillation: Secondary | ICD-10-CM

## 2021-03-15 DIAGNOSIS — M1A9XX1 Chronic gout, unspecified, with tophus (tophi): Secondary | ICD-10-CM

## 2021-03-15 LAB — POCT INR: INR: 2 (ref 2.0–3.0)

## 2021-03-15 NOTE — Patient Instructions (Addendum)
Pre visit review using our clinic review tool, if applicable. No additional management support is needed unless otherwise documented below in the visit note.  Continue 1 tablet daily except take 1 1/2 tablets on Mon, Wed, and Fri. Recheck in 6 weeks per pt request.

## 2021-03-15 NOTE — Patient Instructions (Signed)
Set up one month follow up.   If BP still up at that time we will need to add additional medication.         Keep daily intake of sodium < 2,500 mg

## 2021-03-15 NOTE — Progress Notes (Signed)
Established Patient Office Visit  Subjective:  Patient ID: Jose Bonilla, male    DOB: April 24, 1945  Age: 76 y.o. MRN: [id number]  CC:  Chief Complaint  Patient presents with   Medication Refill    HPI Jose Bonilla presents for medical follow-up and for some refills.  He has history of hypertension, atrial fibrillation, remote history of CVA, chronic gout.  He has tophaceous changes still left elbow but no signs of recent acute inflammation.  He does reportedly take allopurinol regularly.  His other medications include atenolol, diltiazem, lisinopril, and Coumadin.  He is way overdue for lab work and initially declined these today and wished to defer to later time.  We explained that these really need to be done in terms of monitoring things like renal function, electrolytes, CBC, lipids.  He has no specific complaints.  He still works full-time Audiological scientist in churches.  Denies any recent chest pains.  No dizziness.  He states he is compliant with all medications.  Blood pressure is up today but he states this is atypical.  No regular alcohol use.  Past Medical History:  Diagnosis Date   Chronic atrial fibrillation (HCC)    Coumadin therapy   CVA (cerebral infarction)    Possible history   Essential hypertension    Gout    Overweight(278.02)    Seasonal allergies    Secondary cardiomyopathy ([place])    LVEF 40-45% June 2013    No past surgical history on file.  Family History  Problem Relation Age of Onset   Heart disease Mother    Hypertension Father    Diabetes Paternal Aunt     Social History   Socioeconomic History   Marital status: Married    Spouse name: Not on file   Number of children: Not on file   Years of education: Not on file   Highest education level: Not on file  Occupational History   Not on file  Tobacco Use   Smoking status: Never   Smokeless tobacco: Never  Vaping Use   Vaping Use: Never used  Substance and Sexual Activity    Alcohol use: No    Alcohol/week: 0.0 standard drinks    Comment: no use    Drug use: No   Sexual activity: Not on file  Other Topics Concern   Not on file  Social History Narrative   Lives with wife   Married x 65 years   Children; no children   Hobbies; garden   Social Determinants of Radio broadcast assistant Strain: Low Risk    Difficulty of Paying Living Expenses: Not hard at all  Food Insecurity: No Food Insecurity   Worried About Charity fundraiser in the Last Year: Never true   Arboriculturist in the Last Year: Never true  Transportation Needs: No Transportation Needs   Lack of Transportation (Medical): No   Lack of Transportation (Non-Medical): No  Physical Activity: Inactive   Days of Exercise per Week: 0 days   Minutes of Exercise per Session: 0 min  Stress: No Stress Concern Present   Feeling of Stress : Not at all  Social Connections: Not on file  Intimate Partner Violence: Not on file    Outpatient Medications Prior to Visit  Medication Sig Dispense Refill   allopurinol (ZYLOPRIM) 300 MG tablet Take 1 tablet (300 mg total) by mouth daily. 30 tablet 0   atenolol (TENORMIN) 100 MG tablet Take 1/2 (one-half) tablet by mouth  twice daily 30 tablet 0   diltiazem (CARDIZEM CD) 180 MG 24 hr capsule Take 1 capsule by mouth once daily 90 capsule 3   ibuprofen (ADVIL) 200 MG tablet Take 200 mg by mouth every 6 (six) hours as needed.     lisinopril (ZESTRIL) 20 MG tablet Take 1 tablet (20 mg total) by mouth daily. 30 tablet 0   Multiple Vitamin (MULTIVITAMIN) capsule Take 1 capsule by mouth daily.     warfarin (COUMADIN) 2.5 MG tablet TAKE 1 TABLET BY MOUTH ONCE DAILY EXCEPT  TAKE  2  TABLETS ON  SATURDAYS  OR  AS  DIRECTED  BY  ANTICOAGULATION  CLINIC 120 tablet 0   No facility-administered medications prior to visit.    Allergies  Allergen Reactions   Indomethacin Other (See Comments)    Increased heart rate and elevated BP per patient    ROS Review of  Systems  Constitutional:  Negative for fatigue and unexpected weight change.  Eyes:  Negative for visual disturbance.  Respiratory:  Negative for cough, chest tightness and shortness of breath.   Cardiovascular:  Negative for chest pain, palpitations and leg swelling.  Neurological:  Negative for dizziness, syncope, weakness, light-headedness and headaches.     Objective:    Physical Exam Constitutional:      Appearance: He is well-developed.  HENT:     Right Ear: External ear normal.     Left Ear: External ear normal.  Eyes:     Pupils: Pupils are equal, round, and reactive to light.  Neck:     Thyroid: No thyromegaly.  Cardiovascular:     Rate and Rhythm: Normal rate.     Comments: Irregular rhythm consistent with his atrial fibrillation Pulmonary:     Effort: Pulmonary effort is normal. No respiratory distress.     Breath sounds: Normal breath sounds. No wheezing or rales.  Musculoskeletal:     Cervical back: Neck supple.     Right lower leg: No edema.     Left lower leg: No edema.     Comments: He has swelling left olecranon bursa region which is nonerythematous and nontender.  Consistent with chronic tophaceous gout  Neurological:     Mental Status: He is alert and oriented to person, place, and time.    BP (!) 160/100 (BP Location: Left Arm, Patient Position: Sitting, Cuff Size: Normal)    Pulse (!) 59    Temp (!) 97.4 F (36.3 C) (Oral)    Wt 202 lb 4.8 oz (91.8 kg)    SpO2 99%    BMI 30.76 kg/m  Wt Readings from Last 3 Encounters:  03/15/21 202 lb 4.8 oz (91.8 kg)  01/03/21 197 lb (89.4 kg)  10/13/20 206 lb 9.6 oz (93.7 kg)     Health Maintenance Due  Topic Date Due   TETANUS/TDAP  Never done   Zoster Vaccines- Shingrix (1 of 2) Never done   COVID-19 Vaccine (3 - Moderna risk series) 03/01/2020    There are no preventive care reminders to display for this patient.  Lab Results  Component Value Date   TSH 1.80 05/10/2011   Lab Results  Component Value  Date   WBC 6.5 12/28/2016   HGB 13.4 12/28/2016   HCT 43.8 12/28/2016   MCV 87.3 12/28/2016   PLT 149 (L) 12/28/2016   Lab Results  Component Value Date   NA 142 11/15/2017   K 4.5 11/15/2017   CO2 28 11/15/2017   GLUCOSE 100 (H) 11/15/2017  BUN 24 (H) 11/15/2017   CREATININE 1.13 11/15/2017   BILITOT 0.9 06/20/2016   ALKPHOS 78 06/20/2016   AST 20 06/20/2016   ALT 16 06/20/2016   PROT 6.5 06/20/2016   ALBUMIN 4.2 06/20/2016   CALCIUM 9.2 11/15/2017   ANIONGAP 10 12/28/2016   GFR 67.49 11/15/2017   Lab Results  Component Value Date   CHOL 151 08/16/2014   Lab Results  Component Value Date   HDL 27.50 (L) 08/16/2014   No results found for: [facility] Lab Results  Component Value Date   TRIG 440.0 (H) 08/16/2014   Lab Results  Component Value Date   CHOLHDL 5 08/16/2014   No results found for: HGBA1C    Assessment & Plan:   Problem List Items Addressed This Visit       Unprioritized   Chronic gout   Relevant Orders   Uric Acid   Hypertension - Primary   Relevant Orders   CBC with Differential/Platelet   Lipid panel   CMP   Atrial fibrillation ([place])   Other Visit Diagnoses     High risk medication use       Relevant Orders   CBC with Differential/Platelet   CMP     Patient has multiple chronic problems as above.  His blood pressure very poorly controlled today but he states this is atypical.  We discussed possible change of medications but he would like to monitor for a month and reassess in 1 month time.  Handout on DASH diet given.  Needs to lose some weight.  Keep sodium intake less than 2500 mg daily.  Monitor closely at home and reassess and if up at 1 month consider additional medication  -Recommend labs with CBC, CMP, uric acid, lipids  -He is getting pro time later today through our Coumadin clinic  No orders of the defined types were placed in this encounter.   Follow-up: Return in about 1 month (around 04/12/2021).    Carolann Littler, MD

## 2021-03-15 NOTE — Progress Notes (Signed)
Continue 1 tablet daily except take 1 1/2 tablets on Mon, Wed, and Fri. Recheck in 6 weeks per pt request.

## 2021-03-16 LAB — CBC WITH DIFFERENTIAL/PLATELET
Basophils Absolute: 0.1 10*3/uL (ref 0.0–0.1)
Basophils Relative: 1.2 % (ref 0.0–3.0)
Eosinophils Absolute: 0.2 10*3/uL (ref 0.0–0.7)
Eosinophils Relative: 3.6 % (ref 0.0–5.0)
HCT: 43.1 % (ref 39.0–52.0)
Hemoglobin: 13.9 g/dL (ref 13.0–17.0)
Lymphocytes Relative: 21.8 % (ref 12.0–46.0)
Lymphs Abs: 1.2 10*3/uL (ref 0.7–4.0)
MCHC: 32.3 g/dL (ref 30.0–36.0)
MCV: 87.7 fl (ref 78.0–100.0)
Monocytes Absolute: 0.5 10*3/uL (ref 0.1–1.0)
Monocytes Relative: 8.9 % (ref 3.0–12.0)
Neutro Abs: 3.5 10*3/uL (ref 1.4–7.7)
Neutrophils Relative %: 64.5 % (ref 43.0–77.0)
Platelets: 177 10*3/uL (ref 150.0–400.0)
RBC: 4.91 Mil/uL (ref 4.22–5.81)
RDW: 17 % — ABNORMAL HIGH (ref 11.5–15.5)
WBC: 5.5 10*3/uL (ref 4.0–10.5)

## 2021-03-16 LAB — COMPREHENSIVE METABOLIC PANEL
ALT: 14 U/L (ref 0–53)
AST: 21 U/L (ref 0–37)
Albumin: 4 g/dL (ref 3.5–5.2)
Alkaline Phosphatase: 75 U/L (ref 39–117)
BUN: 14 mg/dL (ref 6–23)
CO2: 29 mEq/L (ref 19–32)
Calcium: 9.7 mg/dL (ref 8.4–10.5)
Chloride: 103 mEq/L (ref 96–112)
Creatinine, Ser: 1.13 mg/dL (ref 0.40–1.50)
GFR: 62.93 mL/min (ref >60.00)
Glucose, Bld: 81 mg/dL (ref 70–99)
Potassium: 4.4 mEq/L (ref 3.5–5.1)
Sodium: 140 mEq/L (ref 135–145)
Total Bilirubin: 1 mg/dL (ref 0.2–1.2)
Total Protein: 6.8 g/dL (ref 6.0–8.3)

## 2021-03-16 LAB — LIPID PANEL
Cholesterol: 155 mg/dL (ref 0–200)
HDL: 37.6 mg/dL — ABNORMAL LOW (ref >39.00)
LDL Cholesterol: 79 mg/dL (ref 0–99)
NonHDL: 116.93
Total CHOL/HDL Ratio: 4
Triglycerides: 188 mg/dL — ABNORMAL HIGH (ref 0.0–149.0)
VLDL: 37.6 mg/dL (ref 0.0–40.0)

## 2021-03-16 LAB — URIC ACID: Uric Acid, Serum: 6.4 mg/dL (ref 4.0–7.8)

## 2021-03-22 ENCOUNTER — Other Ambulatory Visit: Payer: Self-pay | Admitting: Family Medicine

## 2021-03-22 DIAGNOSIS — I10 Essential (primary) hypertension: Secondary | ICD-10-CM

## 2021-03-22 DIAGNOSIS — M1A00X0 Idiopathic chronic gout, unspecified site, without tophus (tophi): Secondary | ICD-10-CM

## 2021-03-29 ENCOUNTER — Ambulatory Visit: Payer: Medicare Other | Admitting: Family Medicine

## 2021-04-11 ENCOUNTER — Telehealth: Payer: Self-pay | Admitting: Pharmacist

## 2021-04-11 NOTE — Chronic Care Management (AMB) (Signed)
? ? ?  Chronic Care Management ?Pharmacy Assistant  ? ?Name: Jose Bonilla  MRN: [id number] DOB: 06-May-1945 ? ?Reason for Encounter: Cancel appointment due to CSS. ?Patients wife notified, she states patient would like to cancel his appointments for now, his wife voices a clear understanding. ? ?Gennie Alma CMA  ?Clinical Pharmacist Assistant ?[phone] ? ?

## 2021-04-12 ENCOUNTER — Telehealth: Payer: Self-pay | Admitting: Family Medicine

## 2021-04-12 DIAGNOSIS — I10 Essential (primary) hypertension: Secondary | ICD-10-CM

## 2021-04-12 DIAGNOSIS — M1A00X0 Idiopathic chronic gout, unspecified site, without tophus (tophi): Secondary | ICD-10-CM

## 2021-04-12 MED ORDER — LISINOPRIL 20 MG PO TABS: ORAL_TABLET | ORAL | 0 refills | Status: DC

## 2021-04-12 MED ORDER — ALLOPURINOL 300 MG PO TABS: 300.0000 mg | ORAL_TABLET | Freq: Every day | ORAL | 0 refills | Status: DC

## 2021-04-12 MED ORDER — ATENOLOL 100 MG PO TABS: ORAL_TABLET | ORAL | 0 refills | Status: DC

## 2021-04-12 NOTE — Telephone Encounter (Signed)
Pt informed of the results and verbalized understanding. Pt request refill on Lisinopril, Atenolol and Allopurinol. Rx sent  ?

## 2021-04-12 NOTE — Telephone Encounter (Signed)
Pt is calling and would like blood work results from feb 2023 ?

## 2021-04-19 ENCOUNTER — Ambulatory Visit: Payer: Medicare Other | Admitting: Family Medicine

## 2021-04-20 ENCOUNTER — Ambulatory Visit: Payer: Medicare Other | Admitting: Cardiology

## 2021-04-26 ENCOUNTER — Ambulatory Visit: Payer: Medicare Other

## 2021-05-03 ENCOUNTER — Ambulatory Visit (INDEPENDENT_AMBULATORY_CARE_PROVIDER_SITE_OTHER): Payer: Medicare Other

## 2021-05-03 ENCOUNTER — Ambulatory Visit (INDEPENDENT_AMBULATORY_CARE_PROVIDER_SITE_OTHER): Payer: Medicare Other | Admitting: Family Medicine

## 2021-05-03 ENCOUNTER — Encounter: Payer: Self-pay | Admitting: Family Medicine

## 2021-05-03 VITALS — BP 154/80 | HR 71 | Temp 97.3°F | Ht 68.0 in | Wt 204.5 lb

## 2021-05-03 DIAGNOSIS — Z7901 Long term (current) use of anticoagulants: Secondary | ICD-10-CM

## 2021-05-03 DIAGNOSIS — R202 Paresthesia of skin: Secondary | ICD-10-CM

## 2021-05-03 DIAGNOSIS — I10 Essential (primary) hypertension: Secondary | ICD-10-CM

## 2021-05-03 LAB — POCT INR: INR: 1.8 — AB (ref 2.0–3.0)

## 2021-05-03 MED ORDER — LISINOPRIL 40 MG PO TABS: 40.0000 mg | ORAL_TABLET | Freq: Every day | ORAL | 3 refills | Status: DC

## 2021-05-03 NOTE — Patient Instructions (Addendum)
Pre visit review using our clinic review tool, if applicable. No additional management support is needed unless otherwise documented below in the visit note. ? ?Increase dose today to take 2 1/2 tablets and the continue 1 tablet daily except take 1 1/2 tablets on Mon, Wed, and Fri. Recheck in 4 weeks. ?

## 2021-05-03 NOTE — Progress Notes (Signed)
? ?Established Patient Office Visit ? ?Subjective:  ?Patient ID: Jose Bonilla, male    DOB: [birth year]  Age: 76 y.o. MRN: [id number] ? ?CC:  ?Chief Complaint  ?Patient presents with  ? Hypertension  ? Back Pain  ?  Patient complains of low back pain  ? ? ?HPI ?Jose Bonilla presents for follow-up.  Recent elevated blood pressure.  He was reluctant to go on more medication but preferred to monitor for a month.  He has not been checking this regularly.  Blood pressure still up today.  Currently on atenolol 50 mg twice daily, diltiazem 180 mg daily, and lisinopril 20 mg daily.  He states he is compliant with therapy.  He has history of gout which is controlled with allopurinol.  Recent uric acid level 6.4.  Intermittent low back pain.  No radiculitis symptoms. ? ?He has had some intermittent numbness left hand.  Spares the fifth and part of the fourth digit.  We discussed possible carpal tunnel syndrome.  No significant pain.  No neck pain.  Right-hand-dominant.  He states his left side is always weaker than the right but no acute weakness. ? ?Past Medical History:  ?Diagnosis Date  ? Chronic atrial fibrillation (HCC)   ? Coumadin therapy  ? CVA (cerebral infarction)   ? Possible history  ? Essential hypertension   ? Gout   ? Overweight(278.02)   ? Seasonal allergies   ? Secondary cardiomyopathy ([place])   ? LVEF 40-45% June 2013  ? ? ?History reviewed. No pertinent surgical history. ? ?Family History  ?Problem Relation Age of Onset  ? Heart disease Mother   ? Hypertension Father   ? Diabetes Paternal Aunt   ? ? ?Social History  ? ?Socioeconomic History  ? Marital status: Married  ?  Spouse name: Not on file  ? Number of children: Not on file  ? Years of education: Not on file  ? Highest education level: Not on file  ?Occupational History  ? Not on file  ?Tobacco Use  ? Smoking status: Never  ? Smokeless tobacco: Never  ?Vaping Use  ? Vaping Use: Never used  ?Substance and Sexual Activity  ? Alcohol use: No  ?   Alcohol/week: 0.0 standard drinks  ?  Comment: no use   ? Drug use: No  ? Sexual activity: Not on file  ?Other Topics Concern  ? Not on file  ?Social History Narrative  ? Lives with wife  ? Married x 47 years  ? Children; no children  ? Hobbies; garden  ? ?Social Determinants of Health  ? ?Financial Resource Strain: Low Risk   ? Difficulty of Paying Living Expenses: Not hard at all  ?Food Insecurity: No Food Insecurity  ? Worried About Charity fundraiser in the Last Year: Never true  ? Ran Out of Food in the Last Year: Never true  ?Transportation Needs: No Transportation Needs  ? Lack of Transportation (Medical): No  ? Lack of Transportation (Non-Medical): No  ?Physical Activity: Inactive  ? Days of Exercise per Week: 0 days  ? Minutes of Exercise per Session: 0 min  ?Stress: No Stress Concern Present  ? Feeling of Stress : Not at all  ?Social Connections: Not on file  ?Intimate Partner Violence: Not on file  ? ? ?Outpatient Medications Prior to Visit  ?Medication Sig Dispense Refill  ? allopurinol (ZYLOPRIM) 300 MG tablet Take 1 tablet (300 mg total) by mouth daily. 90 tablet 0  ? atenolol (TENORMIN) 100 MG  tablet Take 1/2 (one-half) tablet by mouth twice daily 90 tablet 0  ? diltiazem (CARDIZEM CD) 180 MG 24 hr capsule Take 1 capsule by mouth once daily 90 capsule 3  ? ibuprofen (ADVIL) 200 MG tablet Take 200 mg by mouth every 6 (six) hours as needed.    ? Multiple Vitamin (MULTIVITAMIN) capsule Take 1 capsule by mouth daily.    ? warfarin (COUMADIN) 2.5 MG tablet TAKE 1 TABLET BY MOUTH ONCE DAILY EXCEPT  TAKE  2  TABLETS ON  SATURDAYS  OR  AS  DIRECTED  BY  ANTICOAGULATION  CLINIC 120 tablet 0  ? lisinopril (ZESTRIL) 20 MG tablet TAKE 1 TABLET BY MOUTH DAILY - NEEDS APPOINTMENT FOR FUTURE REFILLS 90 tablet 0  ? ?No facility-administered medications prior to visit.  ? ? ?Allergies  ?Allergen Reactions  ? Indomethacin Other (See Comments)  ?  Increased heart rate and elevated BP per patient  ? ? ?ROS ?Review of  Systems  ?Constitutional:  Negative for fatigue.  ?Eyes:  Negative for visual disturbance.  ?Respiratory:  Negative for cough, chest tightness and shortness of breath.   ?Cardiovascular:  Negative for chest pain, palpitations and leg swelling.  ?Neurological:  Negative for dizziness, syncope, weakness, light-headedness and headaches.  ? ?  ?Objective:  ?  ?Physical Exam ?Constitutional:   ?   Appearance: He is well-developed.  ?HENT:  ?   Right Ear: External ear normal.  ?   Left Ear: External ear normal.  ?Eyes:  ?   Pupils: Pupils are equal, round, and reactive to light.  ?Neck:  ?   Thyroid: No thyromegaly.  ?Cardiovascular:  ?   Rate and Rhythm: Normal rate and regular rhythm.  ?Pulmonary:  ?   Effort: Pulmonary effort is normal. No respiratory distress.  ?   Breath sounds: Normal breath sounds. No wheezing or rales.  ?Musculoskeletal:  ?   Cervical back: Neck supple.  ?Neurological:  ?   Mental Status: He is alert and oriented to person, place, and time.  ? ? ?BP (!) 154/80 (BP Location: Left Arm, Cuff Size: Large)   Pulse 71   Temp (!) 97.3 ?F (36.3 ?C) (Oral)   Ht '5\' 8"'$  (1.727 m)   Wt 204 lb 8 oz (92.8 kg)   SpO2 98%   BMI 31.09 kg/m?  ?Wt Readings from Last 3 Encounters:  ?05/03/21 204 lb 8 oz (92.8 kg)  ?03/15/21 202 lb 4.8 oz (91.8 kg)  ?01/03/21 197 lb (89.4 kg)  ? ? ? ?Health Maintenance Due  ?Topic Date Due  ? TETANUS/TDAP  Never done  ? Zoster Vaccines- Shingrix (1 of 2) Never done  ? COVID-19 Vaccine (3 - Moderna risk series) 03/01/2020  ? ? ?There are no preventive care reminders to display for this patient. ? ?Lab Results  ?Component Value Date  ? TSH 1.80 05/10/2011  ? ?Lab Results  ?Component Value Date  ? WBC 5.5 03/15/2021  ? HGB 13.9 03/15/2021  ? HCT 43.1 03/15/2021  ? MCV 87.7 03/15/2021  ? PLT 177.0 03/15/2021  ? ?Lab Results  ?Component Value Date  ? NA 140 03/15/2021  ? K 4.4 03/15/2021  ? CO2 29 03/15/2021  ? GLUCOSE 81 03/15/2021  ? BUN 14 03/15/2021  ? CREATININE 1.13 03/15/2021   ? BILITOT 1.0 03/15/2021  ? ALKPHOS 75 03/15/2021  ? AST 21 03/15/2021  ? ALT 14 03/15/2021  ? PROT 6.8 03/15/2021  ? ALBUMIN 4.0 03/15/2021  ? CALCIUM 9.7 03/15/2021  ?  ANIONGAP 10 12/28/2016  ? GFR 62.93 03/15/2021  ? ?Lab Results  ?Component Value Date  ? CHOL 155 03/15/2021  ? ?Lab Results  ?Component Value Date  ? HDL 37.60 (L) 03/15/2021  ? ?Lab Results  ?Component Value Date  ? [place] 79 03/15/2021  ? ?Lab Results  ?Component Value Date  ? TRIG 188.0 (H) 03/15/2021  ? ?Lab Results  ?Component Value Date  ? CHOLHDL 4 03/15/2021  ? ?No results found for: HGBA1C ? ?  ?Assessment & Plan:  ? ?#1 hypertension poorly controlled.  Increase lisinopril to 40 mg daily with new prescription sent.  Continue diltiazem 100 mg daily and atenolol 50 mg twice daily.  Keep sodium intake down.  Set up 1 month follow-up to reassess.  Would try to avoid thiazides with his history of gout. ? ?#2 intermittent numbness left hand.  Suspect carpal tunnel versus possible cervical nerve impingement.  However, does not have any significant pain.  We discussed possible nerve conduction testing but at this point he declines.  Certainly would pursue for any progressive weakness or pain. ? ?Meds ordered this encounter  ?Medications  ? lisinopril (ZESTRIL) 40 MG tablet  ?  Sig: Take 1 tablet (40 mg total) by mouth daily.  ?  Dispense:  90 tablet  ?  Refill:  3  ? ? ?Follow-up: Return in about 1 month (around 06/03/2021).  ? ? ?Carolann Littler, MD ?

## 2021-05-03 NOTE — Progress Notes (Signed)
Increase dose today to take 2 1/2 tablets and the continue 1 tablet daily except take 1 1/2 tablets on Mon, Wed, and Fri. Recheck in 4 weeks. ?

## 2021-05-03 NOTE — Patient Instructions (Signed)
Stop the Lisinopril 20 mg and START the Lisinopril 40 mg daily.   ? ?Keep everything else the same.   ?

## 2021-05-24 ENCOUNTER — Other Ambulatory Visit: Payer: Self-pay | Admitting: Cardiology

## 2021-05-24 ENCOUNTER — Other Ambulatory Visit: Payer: Self-pay | Admitting: Family Medicine

## 2021-05-24 DIAGNOSIS — Z7901 Long term (current) use of anticoagulants: Secondary | ICD-10-CM

## 2021-05-24 DIAGNOSIS — M1A00X0 Idiopathic chronic gout, unspecified site, without tophus (tophi): Secondary | ICD-10-CM

## 2021-05-25 ENCOUNTER — Other Ambulatory Visit: Payer: Self-pay

## 2021-05-25 DIAGNOSIS — M1A00X0 Idiopathic chronic gout, unspecified site, without tophus (tophi): Secondary | ICD-10-CM

## 2021-05-25 MED ORDER — ALLOPURINOL 300 MG PO TABS: 300.0000 mg | ORAL_TABLET | Freq: Every day | ORAL | 0 refills | Status: DC

## 2021-05-25 MED ORDER — WARFARIN SODIUM 2.5 MG PO TABS: ORAL_TABLET | ORAL | 1 refills | Status: DC

## 2021-05-25 NOTE — Telephone Encounter (Signed)
Pt is compliant with warfarin management and PCP apts.  ?Sent in warfarin and rejected allopurinol since it has already been sent in.  ? ?Had to refuse both scripts in order to resend warfarin.  ?Sent in warfarin. ?

## 2021-05-31 ENCOUNTER — Ambulatory Visit (INDEPENDENT_AMBULATORY_CARE_PROVIDER_SITE_OTHER): Payer: Medicare Other

## 2021-05-31 DIAGNOSIS — Z7901 Long term (current) use of anticoagulants: Secondary | ICD-10-CM | POA: Diagnosis not present

## 2021-05-31 LAB — POCT INR: INR: 1.5 — AB (ref 2.0–3.0)

## 2021-05-31 NOTE — Progress Notes (Signed)
Increase dose today to take 2 tablets and increase dose tomorrow to take 1 1/2 tablets and then change weekly dose to take 1 1/2 tablets daily except take 1 tablet on Tuesdays and Saturdays. Recheck in 2 weeks. ?

## 2021-05-31 NOTE — Patient Instructions (Addendum)
Pre visit review using our clinic review tool, if applicable. No additional management support is needed unless otherwise documented below in the visit note. ? ?Increase dose today to take 2 tablets and increase dose tomorrow to take 1 1/2 tablets and then change weekly dose to take 1 1/2 tablets daily except take 1 tablet on Tuesdays and Saturdays. Recheck in 2 weeks. ?

## 2021-06-07 ENCOUNTER — Ambulatory Visit (INDEPENDENT_AMBULATORY_CARE_PROVIDER_SITE_OTHER): Payer: Medicare Other | Admitting: Dermatology

## 2021-06-07 DIAGNOSIS — C4442 Squamous cell carcinoma of skin of scalp and neck: Secondary | ICD-10-CM

## 2021-06-07 DIAGNOSIS — D485 Neoplasm of uncertain behavior of skin: Secondary | ICD-10-CM

## 2021-06-07 DIAGNOSIS — L820 Inflamed seborrheic keratosis: Secondary | ICD-10-CM

## 2021-06-07 NOTE — Patient Instructions (Signed)

## 2021-06-14 ENCOUNTER — Ambulatory Visit (INDEPENDENT_AMBULATORY_CARE_PROVIDER_SITE_OTHER): Payer: Medicare Other

## 2021-06-14 DIAGNOSIS — Z7901 Long term (current) use of anticoagulants: Secondary | ICD-10-CM | POA: Diagnosis not present

## 2021-06-14 LAB — POCT INR: INR: 1.5 — AB (ref 2.0–3.0)

## 2021-06-14 NOTE — Progress Notes (Signed)
Increase dose today to take 2 1/2  tablets and increase dose tomorrow to take 2 tablets and then change weekly dose to take 1 1/2 tablets daily except take 2 tablets on Wednesday. Recheck in 2 weeks. ?

## 2021-06-14 NOTE — Patient Instructions (Addendum)
Pre visit review using our clinic review tool, if applicable. No additional management support is needed unless otherwise documented below in the visit note. ? ?Increase dose today to take 2 1/2  tablets and increase dose tomorrow to take 2 tablets and then change weekly dose to take 1 1/2 tablets daily except take 2 tablets on Wednesday. Recheck in 2 weeks. ?

## 2021-06-22 ENCOUNTER — Ambulatory Visit: Payer: Medicare Other | Admitting: Cardiology

## 2021-06-24 ENCOUNTER — Encounter: Payer: Self-pay | Admitting: Dermatology

## 2021-06-24 NOTE — Progress Notes (Signed)
   Follow-Up Visit   Subjective  Jose Bonilla is a 76 y.o. male who presents for the following: Skin Problem (Here to check lesion on top of scalp. Its scaly. Check lesion on left cheek. No history of skin cancer. ).  Patient lesions on scalp and cheek Location:  Duration:  Quality:  Associated Signs/Symptoms: Modifying Factors:  Severity:  Timing: Context:   Objective  Well appearing patient in no apparent distress; mood and affect are within normal limits. Mid Parietal Scalp 1 cm focally eroded nodule biopsy crust, SCCA         A focused examination was performed including head and neck.. Relevant physical exam findings are noted in the Assessment and Plan.   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  Skin / nail biopsy Type of biopsy: tangential   Informed consent: discussed and consent obtained   Timeout: patient name, date of birth, surgical site, and procedure verified   Anesthesia: the lesion was anesthetized in a standard fashion   Anesthetic:  1% lidocaine w/ epinephrine 1-100,000 local infiltration Instrument used: flexible razor blade   Hemostasis achieved with: ferric subsulfate   Outcome: patient tolerated procedure well   Post-procedure details: wound care instructions given    Destruction of lesion Complexity: simple   Destruction method: electrodesiccation and curettage   Informed consent: discussed and consent obtained   Timeout:  patient name, date of birth, surgical site, and procedure verified Anesthesia: the lesion was anesthetized in a standard fashion   Anesthetic:  1% lidocaine w/ epinephrine 1-100,000 local infiltration Curettage performed in three different directions: Yes   Curettage cycles:  3 Lesion length (cm):  1 Lesion width (cm):  1 Margin per side (cm):  0 Final wound size (cm):  1 Hemostasis achieved with:  aluminum chloride Outcome: patient tolerated procedure well with no  complications   Post-procedure details: wound care instructions given    Specimen 1 - Surgical pathology Differential Diagnosis: scc vs bcc txpbx Check Margins: No  After shave biopsy the base of the lesion was treated with curettage plus cautery.      I, Lavonna Monarch, MD, have reviewed all documentation for this visit.  The documentation on 06/24/21 for the exam, diagnosis, procedures, and orders are all accurate and complete.

## 2021-06-28 ENCOUNTER — Ambulatory Visit: Payer: Medicare Other

## 2021-07-05 ENCOUNTER — Ambulatory Visit: Payer: Medicare Other

## 2021-07-12 ENCOUNTER — Ambulatory Visit: Payer: Medicare Other

## 2021-07-12 ENCOUNTER — Ambulatory Visit: Payer: Medicare Other | Admitting: Dermatology

## 2021-07-26 ENCOUNTER — Ambulatory Visit (INDEPENDENT_AMBULATORY_CARE_PROVIDER_SITE_OTHER): Payer: Medicare Other

## 2021-07-26 DIAGNOSIS — Z7901 Long term (current) use of anticoagulants: Secondary | ICD-10-CM | POA: Diagnosis not present

## 2021-07-26 LAB — POCT INR: INR: 2 (ref 2.0–3.0)

## 2021-07-26 NOTE — Progress Notes (Addendum)
Pt reports he missed a dose 3 days ago.  Continue 1 1/2 tablets daily except take 2 tablets on Wednesday. Recheck in 4 weeks.

## 2021-07-26 NOTE — Patient Instructions (Addendum)
Pre visit review using our clinic review tool, if applicable. No additional management support is needed unless otherwise documented below in the visit note.  Continue 1 1/2 tablets daily except take 2 tablets on Wednesday. Recheck in 4 weeks.

## 2021-07-27 ENCOUNTER — Other Ambulatory Visit: Payer: Self-pay | Admitting: Family Medicine

## 2021-07-27 DIAGNOSIS — I10 Essential (primary) hypertension: Secondary | ICD-10-CM

## 2021-08-18 ENCOUNTER — Telehealth: Payer: Self-pay | Admitting: Cardiology

## 2021-08-18 MED ORDER — DILTIAZEM HCL ER COATED BEADS 180 MG PO CP24: 180.0000 mg | ORAL_CAPSULE | Freq: Every day | ORAL | 0 refills | Status: DC

## 2021-08-18 NOTE — Telephone Encounter (Signed)
Done

## 2021-08-18 NOTE — Telephone Encounter (Signed)
*  STAT* If patient is at the pharmacy, call can be transferred to refill team.   1. Which medications need to be refilled? (please list name of each medication and dose if known)   diltiazem (CARDIZEM CD) 180 MG 24 hr capsule  2. Which pharmacy/location (including street and city if local pharmacy) is medication to be sent to?  [place], [place]  3. Do they need a 30 day or 90 day supply?   90 day   Wife called stating patient only has 6 capsules left.   Patient has appointment on 09/18/21.

## 2021-08-23 ENCOUNTER — Telehealth: Payer: Self-pay

## 2021-08-23 ENCOUNTER — Ambulatory Visit: Payer: Medicare Other

## 2021-08-23 NOTE — Telephone Encounter (Signed)
Pt's wife, Dalene Seltzer, LVM reporting pt has to cancel apt at [facility] today due to being out of town. She would like a call back to RS to next week.  Tried to reach [place] but no answer and no VM.

## 2021-08-23 NOTE — Telephone Encounter (Signed)
Tried to contact pt's wife again but no answer and no VM

## 2021-08-24 NOTE — Telephone Encounter (Signed)
Still no answer, no VM on home number. LVM on cell number

## 2021-08-30 ENCOUNTER — Ambulatory Visit (INDEPENDENT_AMBULATORY_CARE_PROVIDER_SITE_OTHER): Payer: Medicare Other

## 2021-08-30 DIAGNOSIS — Z7901 Long term (current) use of anticoagulants: Secondary | ICD-10-CM

## 2021-08-30 LAB — POCT INR: INR: 1.8 — AB (ref 2.0–3.0)

## 2021-08-30 NOTE — Progress Notes (Signed)
Increase dose today to take 3 tablets and then change weekly dose to take 1 1/2 tablets daily except take 2 tablets on Mondays, Wednesday and Fridays. Recheck in 3 weeks.

## 2021-08-30 NOTE — Patient Instructions (Addendum)
Pre visit review using our clinic review tool, if applicable. No additional management support is needed unless otherwise documented below in the visit note.  Increase dose today to take 3 tablets and then change weekly dose to take 1 1/2 tablets daily except take 2 tablets on Mondays, Wednesday and Fridays. Recheck in 3 weeks.

## 2021-09-11 NOTE — Progress Notes (Signed)
Cardiology Office Note    Date:  09/18/2021   ID:  Jose Bonilla, DOB Jun 05, 1945, MRN [id number]   PCP:  [name], MD   [place]  Cardiologist:  Rozann Lesches, MD   Advanced Practice Provider:  No care team member to display Electrophysiologist:  None   [phone]   Chief Complaint  Patient presents with   Follow-up    History of Present Illness:  Jose Bonilla is a 76 y.o. male with history of permanent Afib on coumadin, atenolol and cardizem, cardiomyopathy LVEF 60-65% on last echo 01/2020, trivial MR, HTN.  Patient comes in for f/u. 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 Keeps him busy. BP was running up so he started magnesium supplements. Tries to watch closely. No palpitations. No bleeding problems on coumadin. No regular exercise but mows his lawn and walks a lot when he's working, plows his garden.     Past Medical History:  Diagnosis Date   Chronic atrial fibrillation (HCC)    Coumadin therapy   CVA (cerebral infarction)    Possible history   Essential hypertension    Gout    Overweight(278.02)    Seasonal allergies    Secondary cardiomyopathy ([place])    LVEF 40-45% June 2013    History reviewed. No pertinent surgical history.  Current Medications: Current Meds  Medication Sig   allopurinol (ZYLOPRIM) 300 MG tablet Take 1 tablet (300 mg total) by mouth daily.   atenolol (TENORMIN) 100 MG tablet Take 1/2 (one-half) tablet by mouth twice daily   diltiazem (CARDIZEM CD) 180 MG 24 hr capsule Take 1 capsule (180 mg total) by mouth daily.   ibuprofen (ADVIL) 200 MG tablet Take 200 mg by mouth every 6 (six) hours as needed.   lisinopril (ZESTRIL) 40 MG tablet Take 1 tablet (40 mg total) by mouth daily.   Multiple Vitamin (MULTIVITAMIN) capsule Take 1 capsule by mouth daily.   warfarin (COUMADIN) 2.5 MG tablet TAKE 1 TABLET BY MOUTH ONCE DAILY EXCEPT  TAKE  1 1/2 TABLETS ON MONDAYS, WEDNESDAYS, AND FRIDAYS OR  AS  DIRECTED   BY  ANTICOAGULATION  CLINIC     Allergies:   Indomethacin   Social History   Socioeconomic History   Marital status: Married    Spouse name: Not on file   Number of children: Not on file   Years of education: Not on file   Highest education level: Not on file  Occupational History   Not on file  Tobacco Use   Smoking status: Never   Smokeless tobacco: Never  Vaping Use   Vaping Use: Never used  Substance and Sexual Activity   Alcohol use: No    Alcohol/week: 0.0 standard drinks of alcohol    Comment: no use    Drug use: No   Sexual activity: Not on file  Other Topics Concern   Not on file  Social History Narrative   Lives with wife   Married x 35 years   Children; no children   Hobbies; garden   Social Determinants of Health   Financial Resource Strain: Low Risk  (01/03/2021)   Overall Financial Resource Strain (CARDIA)    Difficulty of Paying Living Expenses: Not hard at all  Food Insecurity: No Food Insecurity (01/03/2021)   Hunger Vital Sign    Worried About Running Out of Food in the Last Year: Never true    Ran Out of Food in the Last Year: Never true  Transportation Needs:  No Transportation Needs (01/03/2021)   PRAPARE - Hydrologist (Medical): No    Lack of Transportation (Non-Medical): No  Physical Activity: Inactive (01/03/2021)   Exercise Vital Sign    Days of Exercise per Week: 0 days    Minutes of Exercise per Session: 0 min  Stress: No Stress Concern Present (01/03/2021)   [place]    Feeling of Stress : Not at all  Social Connections: Moderately Integrated (12/18/2019)   Social Connection and Isolation Panel [NHANES]    Frequency of Communication with Friends and Family: More than three times a week    Frequency of Social Gatherings with Friends and Family: More than three times a week    Attends Religious Services: More than 4 times per year     Active Member of [employer] or Organizations: No    Attends Archivist Meetings: Never    Marital Status: Married     Family History:  The patient's  family history includes Diabetes in his paternal aunt; Heart disease in his mother; Hypertension in his father.   ROS:   Please see the history of present illness.    ROS All other systems reviewed and are negative.   PHYSICAL EXAM:   VS:  BP (!) 144/78   Pulse 86   Ht '5\' 8"'$  (1.727 m)   Wt 206 lb (93.4 kg)   SpO2 98%   BMI 31.32 kg/m   Physical Exam  GEN: Obese, in no acute distress  Neck: no JVD, carotid bruits, or masses [id number]:KXF;8/1 systolic murmur LSB, rubs, or gallops  Respiratory:  clear to auscultation bilaterally, normal work of breathing GI: soft, nontender, nondistended, + BS Ext: ankle edema otherwise without cyanosis, clubbing, Good distal pulses bilaterally Neuro:  Alert and Oriented x 3,  Psych: euthymic mood, full affect  Wt Readings from Last 3 Encounters:  09/18/21 206 lb (93.4 kg)  05/03/21 204 lb 8 oz (92.8 kg)  03/15/21 202 lb 4.8 oz (91.8 kg)      Studies/Labs Reviewed:   EKG:  EKG is not ordered today.     Recent Labs: 03/15/2021: ALT 14; BUN 14; Creatinine, Ser 1.13; Hemoglobin 13.9; Platelets 177.0; Potassium 4.4; Sodium 140   Lipid Panel    Component Value Date/Time   CHOL 155 03/15/2021 1503   TRIG 188.0 (H) 03/15/2021 1503   HDL 37.60 (L) 03/15/2021 1503   CHOLHDL 4 03/15/2021 1503   VLDL 37.6 03/15/2021 1503   LDLCALC 79 03/15/2021 1503   LDLDIRECT 67.0 08/16/2014 1517    Additional studies/ records that were reviewed today include:  Echocardiogram 01/20/2020:  1. Left ventricular ejection fraction, by estimation, is 60 to 65%. The  left ventricle has normal function. The left ventricle has no regional  wall motion abnormalities. There is moderate left ventricular hypertrophy.  Left ventricular diastolic  parameters are indeterminate in the setting of atrial fibrillation.    2. Right ventricular systolic function is normal. The right ventricular  size is mildly enlarged. There is moderately elevated pulmonary artery  systolic pressure. The estimated right ventricular systolic pressure is  82.9 mmHg.   3. Left atrial size was moderately dilated.   4. Right atrial size was severely dilated.   5. The mitral valve is grossly normal. Trivial mitral valve  regurgitation.   6. The aortic valve is tricuspid. Aortic valve regurgitation is mild.  Aortic regurgitation PHT measures 864 msec.   7. The  inferior vena cava is normal in size with <50% respiratory  variability, suggesting right atrial pressure of 8 mmHg.      Risk Assessment/Calculations:    CHA2DS2-VASc Score = 4   This indicates a 4.8% annual risk of stroke. The patient's score is based upon: CHF History: 1 HTN History: 1 Diabetes History: 0 Stroke History: 0 Vascular Disease History: 0 Age Score: 2 Gender Score: 0        ASSESSMENT:    1. Permanent atrial fibrillation ([place])   2. History of cardiomyopathy   3. Mitral valve insufficiency, unspecified etiology   4. Overweight      PLAN:  In order of problems listed above:   Permanent atrial fibrillation.  CHA2DS2-VASc score is 5.  He remains on Coumadin for stroke prophylaxis, followed by PCP.  Heart rate is well controlled today on combination of atenolol and Cardizem CD.  No bleeding problems. Will check CBC and bmet today.    History of cardiomyopathy, LVEF normal at 60 to 65% by echocardiogram 01/2020.  Also normal RV contraction with moderately elevated pulmonary pressures.       Mitral regurgitation, only trivial by echocardiogram 01/2020.  Obesity-exercise and weight loss discussed  Shared Decision Making/Informed Consent        Medication Adjustments/Labs and Tests Ordered: Current medicines are reviewed at length with the patient today.  Concerns regarding medicines are outlined above.  Medication changes, Labs and Tests  ordered today are listed in the Patient Instructions below. Patient Instructions  Medication Instructions:  Your physician recommends that you continue on your current medications as directed. Please refer to the Current Medication list given to you today.   Labwork: BMET,CBC today  Testing/Procedures: None today  Follow-Up: 6 months  Any Other Special Instructions Will Be Listed Below (If Applicable).    Exercise 150 minutes per week.  If you need a refill on your cardiac medications before your next appointment, please call your pharmacy.    Sumner Boast, PA-C  09/18/2021 12:58 PM    [facility] [address], [place], [place]  [address] Phone: [phone]; Fax: [phone]

## 2021-09-18 ENCOUNTER — Other Ambulatory Visit (HOSPITAL_COMMUNITY)
Admission: RE | Admit: 2021-09-18 | Discharge: 2021-09-18 | Disposition: A | Discharge status: Home / Self Care | Payer: Medicare Other | Source: Ambulatory Visit | Attending: Physician Assistant | Admitting: Physician Assistant

## 2021-09-18 ENCOUNTER — Ambulatory Visit (INDEPENDENT_AMBULATORY_CARE_PROVIDER_SITE_OTHER): Payer: Medicare Other | Admitting: Physician Assistant

## 2021-09-18 ENCOUNTER — Encounter: Payer: Self-pay | Admitting: Physician Assistant

## 2021-09-18 VITALS — BP 144/78 | HR 86 | Ht 68.0 in | Wt 206.0 lb

## 2021-09-18 DIAGNOSIS — Z8679 Personal history of other diseases of the circulatory system: Secondary | ICD-10-CM

## 2021-09-18 DIAGNOSIS — I4821 Permanent atrial fibrillation: Secondary | ICD-10-CM | POA: Diagnosis not present

## 2021-09-18 DIAGNOSIS — I10 Essential (primary) hypertension: Secondary | ICD-10-CM

## 2021-09-18 DIAGNOSIS — E663 Overweight: Secondary | ICD-10-CM

## 2021-09-18 DIAGNOSIS — I340 Nonrheumatic mitral (valve) insufficiency: Secondary | ICD-10-CM | POA: Insufficient documentation

## 2021-09-18 LAB — BASIC METABOLIC PANEL
Anion gap: 6 (ref 5–15)
BUN: 19 mg/dL (ref 8–23)
CO2: 24 mmol/L (ref 22–32)
Calcium: 9.1 mg/dL (ref 8.9–10.3)
Chloride: 109 mmol/L (ref 98–111)
Creatinine, Ser: 0.9 mg/dL (ref 0.61–1.24)
GFR, Estimated: >60 mL/min (ref >60)
Glucose, Bld: 104 mg/dL — ABNORMAL HIGH (ref 70–99)
Potassium: 4.1 mmol/L (ref 3.5–5.1)
Sodium: 139 mmol/L (ref 135–145)

## 2021-09-18 LAB — CBC
HCT: 43 % (ref 39.0–52.0)
Hemoglobin: 14 g/dL (ref 13.0–17.0)
MCH: 28.5 pg (ref 26.0–34.0)
MCHC: 32.6 g/dL (ref 30.0–36.0)
MCV: 87.4 fL (ref 80.0–100.0)
Platelets: 166 10*3/uL (ref 150–400)
RBC: 4.92 MIL/uL (ref 4.22–5.81)
RDW: 17 % — ABNORMAL HIGH (ref 11.5–15.5)
WBC: 5.9 10*3/uL (ref 4.0–10.5)
nRBC: 0 % (ref 0.0–0.2)

## 2021-09-18 MED ORDER — LISINOPRIL 40 MG PO TABS: 40.0000 mg | ORAL_TABLET | Freq: Every day | ORAL | 3 refills | Status: DC

## 2021-09-18 MED ORDER — DILTIAZEM HCL ER COATED BEADS 180 MG PO CP24: 180.0000 mg | ORAL_CAPSULE | Freq: Every day | ORAL | 3 refills | Status: DC

## 2021-09-18 NOTE — Addendum Note (Signed)
Addended by: Barbarann Ehlers A on: 09/18/2021 01:03 PM   Modules accepted: Orders

## 2021-09-18 NOTE — Patient Instructions (Signed)
Medication Instructions:  Your physician recommends that you continue on your current medications as directed. Please refer to the Current Medication list given to you today.   Labwork: BMET,CBC today  Testing/Procedures: None today  Follow-Up: 6 months  Any Other Special Instructions Will Be Listed Below (If Applicable).    Exercise 150 minutes per week.  If you need a refill on your cardiac medications before your next appointment, please call your pharmacy.

## 2021-09-20 ENCOUNTER — Ambulatory Visit: Payer: Medicare Other

## 2021-09-25 ENCOUNTER — Ambulatory Visit (INDEPENDENT_AMBULATORY_CARE_PROVIDER_SITE_OTHER): Payer: Medicare Other

## 2021-09-25 DIAGNOSIS — Z7901 Long term (current) use of anticoagulants: Secondary | ICD-10-CM

## 2021-09-25 LAB — POCT INR: INR: 1.9 — AB (ref 2.0–3.0)

## 2021-09-25 NOTE — Progress Notes (Signed)
Pt reports he thinks he missed a dose 3 days ago. Pt is also increasing intake of vitamin K foods.   Increase dose today to take 3 tablets and then change weekly dose to take 2 tablets daily except take 1 1/2 tablets on Tuesdays, Thursdays, and Sundays.

## 2021-09-25 NOTE — Patient Instructions (Addendum)
Pre visit review using our clinic review tool, if applicable. No additional management support is needed unless otherwise documented below in the visit note.  Increase dose today to take 3 tablets and then change weekly dose to take 2 tablets daily except take 1 1/2 tablets on Tuesdays, Thursdays, and Sundays.

## 2021-10-16 ENCOUNTER — Ambulatory Visit (INDEPENDENT_AMBULATORY_CARE_PROVIDER_SITE_OTHER): Payer: Medicare Other

## 2021-10-16 DIAGNOSIS — Z7901 Long term (current) use of anticoagulants: Secondary | ICD-10-CM

## 2021-10-16 LAB — POCT INR: INR: 2.2 (ref 2.0–3.0)

## 2021-10-16 NOTE — Progress Notes (Signed)
Continue 2 tablets daily except take 1 1/2 tablets on Tuesdays, Thursdays, and Sundays. Recheck in 6 weeks.  

## 2021-10-16 NOTE — Patient Instructions (Addendum)
Pre visit review using our clinic review tool, if applicable. No additional management support is needed unless otherwise documented below in the visit note.  Continue 2 tablets daily except take 1 1/2 tablets on Tuesdays, Thursdays, and Sundays. Recheck in 6 weeks.  

## 2021-11-27 ENCOUNTER — Ambulatory Visit: Payer: Medicare Other

## 2021-12-04 ENCOUNTER — Ambulatory Visit (INDEPENDENT_AMBULATORY_CARE_PROVIDER_SITE_OTHER): Payer: Medicare Other

## 2021-12-04 DIAGNOSIS — Z7901 Long term (current) use of anticoagulants: Secondary | ICD-10-CM | POA: Diagnosis not present

## 2021-12-04 LAB — POCT INR: INR: 2.2 (ref 2.0–3.0)

## 2021-12-04 NOTE — Patient Instructions (Signed)
Continue 2 tablets daily except take 1 1/2 tablets on Tuesdays, Thursdays, and Sundays. Recheck in 6 weeks.

## 2021-12-04 NOTE — Progress Notes (Signed)
Continue 2 tablets daily except take 1 1/2 tablets on Tuesdays, Thursdays, and Sundays. Recheck in 6 weeks.

## 2021-12-05 ENCOUNTER — Other Ambulatory Visit: Payer: Self-pay | Admitting: Family Medicine

## 2021-12-05 DIAGNOSIS — M1A00X0 Idiopathic chronic gout, unspecified site, without tophus (tophi): Secondary | ICD-10-CM

## 2022-01-09 ENCOUNTER — Ambulatory Visit: Payer: Medicare Other

## 2022-01-09 ENCOUNTER — Telehealth: Payer: Self-pay

## 2022-01-09 NOTE — Telephone Encounter (Signed)
Unsuccessful attempt to reach patient on preferred number listed in notes for scheduled AWV. Left message on voicemail okay to reschedule. 

## 2022-01-10 ENCOUNTER — Telehealth: Payer: Self-pay | Admitting: Family Medicine

## 2022-01-10 NOTE — Telephone Encounter (Signed)
Left message for patient to call back and schedule Medicare Annual Wellness Visit (AWV) either virtually or in office. Left  my Jose Bonilla number [phone]   Last AWV 01/03/21 please schedule with Nurse Health Adviser   45 min for awv-i and in office appointments 30 min for awv-s  phone/virtual appointments

## 2022-01-15 ENCOUNTER — Ambulatory Visit (INDEPENDENT_AMBULATORY_CARE_PROVIDER_SITE_OTHER): Payer: Medicare Other

## 2022-01-15 DIAGNOSIS — Z7901 Long term (current) use of anticoagulants: Secondary | ICD-10-CM | POA: Diagnosis not present

## 2022-01-15 LAB — POCT INR: INR: 1.3 — AB (ref 2.0–3.0)

## 2022-01-15 NOTE — Patient Instructions (Addendum)
Pre visit review using our clinic review tool, if applicable. No additional management support is needed unless otherwise documented below in the visit note.  Increase dose today to take 3 tablets and increase dose tomorrow to take 2 1/2 tablets and the continue 2 tablets daily except take 1 1/2 tablets on Tuesdays, Thursdays, and Sundays. Recheck in 1 weeks.

## 2022-01-15 NOTE — Progress Notes (Signed)
Pt missed two doses this weekend due to forgetting medications when gong out of town. Pt has been stable on current weekly dose so no long term change in dosing will be made. Pt will return in one week for recheck.  Increase dose today to take 3 tablets and increase dose tomorrow to take 2 1/2 tablets and the continue 2 tablets daily except take 1 1/2 tablets on Tuesdays, Thursdays, and Sundays. Recheck in 1 weeks.

## 2022-01-24 ENCOUNTER — Ambulatory Visit (INDEPENDENT_AMBULATORY_CARE_PROVIDER_SITE_OTHER): Payer: Medicare Other

## 2022-01-24 DIAGNOSIS — Z7901 Long term (current) use of anticoagulants: Secondary | ICD-10-CM | POA: Diagnosis not present

## 2022-01-24 LAB — POCT INR: INR: 2.3 (ref 2.0–3.0)

## 2022-01-24 NOTE — Progress Notes (Signed)
Continue 2 tablets daily except take 1 1/2 tablets on Tuesdays, Thursdays, and Sundays. Recheck in 6 weeks.

## 2022-01-24 NOTE — Patient Instructions (Addendum)
Pre visit review using our clinic review tool, if applicable. No additional management support is needed unless otherwise documented below in the visit note.  Continue 2 tablets daily except take 1 1/2 tablets on Tuesdays, Thursdays, and Sundays. Recheck in 6 weeks.

## 2022-02-07 ENCOUNTER — Other Ambulatory Visit: Payer: Self-pay

## 2022-02-07 DIAGNOSIS — Z7901 Long term (current) use of anticoagulants: Secondary | ICD-10-CM

## 2022-02-07 MED ORDER — WARFARIN SODIUM 2.5 MG PO TABS: ORAL_TABLET | ORAL | 1 refills | Status: DC

## 2022-02-07 NOTE — Telephone Encounter (Signed)
Pt is compliant with warfarin management and PCP apts. ?Sent in refill.  ?

## 2022-02-19 ENCOUNTER — Ambulatory Visit (INDEPENDENT_AMBULATORY_CARE_PROVIDER_SITE_OTHER): Payer: Medicare Other

## 2022-02-19 VITALS — Ht 68.0 in | Wt 198.0 lb

## 2022-02-19 DIAGNOSIS — Z0000 Encounter for general adult medical examination without abnormal findings: Secondary | ICD-10-CM | POA: Diagnosis not present

## 2022-02-19 NOTE — Patient Instructions (Addendum)
Jose Bonilla , Thank you for taking time to come for your Medicare Wellness Visit. I appreciate your ongoing commitment to your health goals. Please review the following plan we discussed and let me know if I can assist you in the future.   These are the goals we discussed:  Goals       Exercise 3x per week (30 min per time)      Lose weight (pt-stated)      Eat healthy      Patient Stated      01/03/2021, lose weight      Weight (lb) < 200 lb (90.7 kg)      Will try to cut bread and stick to healthy foods (vegetables and fruits )   Food Choices to Lower Your Triglycerides Triglycerides are a type of fat in your blood. High levels of triglycerides can increase the risk of heart disease and stroke. If your triglyceride levels are high, the foods you eat and your eating habits are very important. Choosing the right foods can help lower your triglycerides. What general guidelines do I need to follow? Lose weight if you are overweight. Limit or avoid alcohol. Fill one half of your plate with vegetables and green salads. Limit fruit to two servings a day. Choose fruit instead of juice. Make one fourth of your plate whole grains. Look for the word "whole" as the first word in the ingredient list. Fill one fourth of your plate with lean protein foods. Enjoy fatty fish (such as salmon, mackerel, sardines, and tuna) three times a week. Choose healthy fats. Limit foods high in starch and sugar. Eat more home-cooked food and less restaurant, buffet, and fast food. Limit fried foods. Cook foods using methods other than frying. Limit saturated fats. Check ingredient lists to avoid foods with partially hydrogenated oils (trans fats) in them. What foods can I eat? Grains  Whole grains, such as whole wheat or whole grain breads, crackers, cereals, and pasta. Unsweetened oatmeal, bulgur, barley, quinoa, or brown rice. Corn or whole wheat flour tortillas. Vegetables  Fresh or frozen vegetables (raw,  steamed, roasted, or grilled). Green salads. Fruits  All fresh, canned (in natural juice), or frozen fruits. Meat and Other Protein Products  Ground beef (85% or leaner), grass-fed beef, or beef trimmed of fat. Skinless chicken or [place]. Ground chicken or [place]. Pork trimmed of fat. All fish and seafood. Eggs. Dried beans, peas, or lentils. Unsalted nuts or seeds. Unsalted canned or dry beans. Dairy  Low-fat dairy products, such as skim or 1% milk, 2% or reduced-fat cheeses, low-fat ricotta or cottage cheese, or plain low-fat yogurt. Fats and Oils  Tub margarines without trans fats. Light or reduced-fat mayonnaise and salad dressings. Avocado. Safflower, olive, or canola oils. Natural peanut or almond butter. The items listed above may not be a complete list of recommended foods or beverages. Contact your dietitian for more options.  What foods are not recommended? Grains  White bread. White pasta. White rice. Cornbread. Bagels, pastries, and croissants. Crackers that contain trans fat. Vegetables  White potatoes. Corn. Creamed or fried vegetables. Vegetables in a cheese sauce. Fruits  Dried fruits. Canned fruit in light or heavy syrup. Fruit juice. Meat and Other Protein Products  Fatty cuts of meat. Ribs, chicken wings, bacon, sausage, bologna, salami, chitterlings, fatback, hot dogs, bratwurst, and packaged luncheon meats. Dairy  Whole or 2% milk, cream, half-and-half, and cream cheese. Whole-fat or sweetened yogurt. Full-fat cheeses. Nondairy creamers and whipped toppings. Processed cheese,  cheese spreads, or cheese curds. Sweets and Desserts  Corn syrup, sugars, honey, and molasses. Candy. Jam and jelly. Syrup. Sweetened cereals. Cookies, pies, cakes, donuts, muffins, and ice cream. Fats and Oils  Butter, stick margarine, lard, shortening, ghee, or bacon fat. Coconut, palm kernel, or palm oils. Beverages  Alcohol. Sweetened drinks (such as sodas, lemonade, and fruit drinks or  punches). The items listed above may not be a complete list of foods and beverages to avoid. Contact your dietitian for more information.  This information is not intended to replace advice given to you by your health care provider. Make sure you discuss any questions you have with your health care provider. Document Released: 11/10/2003 Document Revised: 06/30/2015 Document Reviewed: 11/26/2012 Elsevier Interactive Patient Education  2017 [employer].         This is a list of the screening recommended for you and due dates:  Health Maintenance  Topic Date Due   DTaP/Tdap/Td vaccine (1 - Tdap) Never done   COVID-19 Vaccine (3 - Moderna risk series) 03/07/2022*   Flu Shot  05/06/2022*   Zoster (Shingles) Vaccine (1 of 2) 05/21/2022*   Pneumonia Vaccine (1 - PCV) 02/20/2023*   Medicare Annual Wellness Visit  02/20/2023   Hepatitis C Screening: USPSTF Recommendation to screen - Ages 18-79 yo.  Completed   HPV Vaccine  Aged Out   Colon Cancer Screening  Discontinued  *Topic was postponed. The date shown is not the original due date.    Advanced directives: Advance directive discussed with you today. Even though you declined this today, please call our office should you change your mind, and we can give you the proper paperwork for you to fill out.   Conditions/risks identified: None  Next appointment: Follow up in one year for your annual wellness visit.    Preventive Care 63 Years and Older, Male  Preventive care refers to lifestyle choices and visits with your health care provider that can promote health and wellness. What does preventive care include? A yearly physical exam. This is also called an annual well check. Dental exams once or twice a year. Routine eye exams. Ask your health care provider how often you should have your eyes checked. Personal lifestyle choices, including: Daily care of your teeth and gums. Regular physical activity. Eating a healthy  diet. Avoiding tobacco and drug use. Limiting alcohol use. Practicing safe sex. Taking low doses of aspirin every day. Taking vitamin and mineral supplements as recommended by your health care provider. What happens during an annual well check? The services and screenings done by your health care provider during your annual well check will depend on your age, overall health, lifestyle risk factors, and family history of disease. Counseling  Your health care provider may ask you questions about your: Alcohol use. Tobacco use. Drug use. Emotional well-being. Home and relationship well-being. Sexual activity. Eating habits. History of falls. Memory and ability to understand (cognition). Work and work Statistician. Screening  You may have the following tests or measurements: Height, weight, and BMI. Blood pressure. Lipid and cholesterol levels. These may be checked every 5 years, or more frequently if you are over 25 years old. Skin check. Lung cancer screening. You may have this screening every year starting at age 23 if you have a 30-pack-year history of smoking and currently smoke or have quit within the past 15 years. Fecal occult blood test (FOBT) of the stool. You may have this test every year starting at age 46. Flexible sigmoidoscopy  or colonoscopy. You may have a sigmoidoscopy every 5 years or a colonoscopy every 10 years starting at age 51. Prostate cancer screening. Recommendations will vary depending on your family history and other risks. Hepatitis C blood test. Hepatitis B blood test. Sexually transmitted disease (STD) testing. Diabetes screening. This is done by checking your blood sugar (glucose) after you have not eaten for a while (fasting). You may have this done every 1-3 years. Abdominal aortic aneurysm (AAA) screening. You may need this if you are a current or former smoker. Osteoporosis. You may be screened starting at age 100 if you are at high risk. Talk with  your health care provider about your test results, treatment options, and if necessary, the need for more tests. Vaccines  Your health care provider may recommend certain vaccines, such as: Influenza vaccine. This is recommended every year. Tetanus, diphtheria, and acellular pertussis (Tdap, Td) vaccine. You may need a Td booster every 10 years. Zoster vaccine. You may need this after age 68. Pneumococcal 13-valent conjugate (PCV13) vaccine. One dose is recommended after age 55. Pneumococcal polysaccharide (PPSV23) vaccine. One dose is recommended after age 43. Talk to your health care provider about which screenings and vaccines you need and how often you need them. This information is not intended to replace advice given to you by your health care provider. Make sure you discuss any questions you have with your health care provider. Document Released: 02/18/2015 Document Revised: 10/12/2015 Document Reviewed: 11/23/2014 Elsevier Interactive Patient Education  2017 [place] Prevention in the Home Falls can cause injuries. They can happen to people of all ages. There are many things you can do to make your home safe and to help prevent falls. What can I do on the outside of my home? Regularly fix the edges of walkways and driveways and fix any cracks. Remove anything that might make you trip as you walk through a door, such as a raised step or threshold. Trim any bushes or trees on the path to your home. Use bright outdoor lighting. Clear any walking paths of anything that might make someone trip, such as rocks or tools. Regularly check to see if handrails are loose or broken. Make sure that both sides of any steps have handrails. Any raised decks and porches should have guardrails on the edges. Have any leaves, snow, or ice cleared regularly. Use sand or salt on walking paths during winter. Clean up any spills in your garage right away. This includes oil or grease spills. What  can I do in the bathroom? Use night lights. Install grab bars by the toilet and in the tub and shower. Do not use towel bars as grab bars. Use non-skid mats or decals in the tub or shower. If you need to sit down in the shower, use a plastic, non-slip stool. Keep the floor dry. Clean up any water that spills on the floor as soon as it happens. Remove soap buildup in the tub or shower regularly. Attach bath mats securely with double-sided non-slip rug tape. Do not have throw rugs and other things on the floor that can make you trip. What can I do in the bedroom? Use night lights. Make sure that you have a light by your bed that is easy to reach. Do not use any sheets or blankets that are too big for your bed. They should not hang down onto the floor. Have a firm chair that has side arms. You can use this for support  while you get dressed. Do not have throw rugs and other things on the floor that can make you trip. What can I do in the kitchen? Clean up any spills right away. Avoid walking on wet floors. Keep items that you use a lot in easy-to-reach places. If you need to reach something above you, use a strong step stool that has a grab bar. Keep electrical cords out of the way. Do not use floor polish or wax that makes floors slippery. If you must use wax, use non-skid floor wax. Do not have throw rugs and other things on the floor that can make you trip. What can I do with my stairs? Do not leave any items on the stairs. Make sure that there are handrails on both sides of the stairs and use them. Fix handrails that are broken or loose. Make sure that handrails are as long as the stairways. Check any carpeting to make sure that it is firmly attached to the stairs. Fix any carpet that is loose or worn. Avoid having throw rugs at the top or bottom of the stairs. If you do have throw rugs, attach them to the floor with carpet tape. Make sure that you have a light switch at the top of the  stairs and the bottom of the stairs. If you do not have them, ask someone to add them for you. What else can I do to help prevent falls? Wear shoes that: Do not have high heels. Have rubber bottoms. Are comfortable and fit you well. Are closed at the toe. Do not wear sandals. If you use a stepladder: Make sure that it is fully opened. Do not climb a closed stepladder. Make sure that both sides of the stepladder are locked into place. Ask someone to hold it for you, if possible. Clearly mark and make sure that you can see: Any grab bars or handrails. First and last steps. Where the edge of each step is. Use tools that help you move around (mobility aids) if they are needed. These include: Canes. Walkers. Scooters. Crutches. Turn on the lights when you go into a dark area. Replace any light bulbs as soon as they burn out. Set up your furniture so you have a clear path. Avoid moving your furniture around. If any of your floors are uneven, fix them. If there are any pets around you, be aware of where they are. Review your medicines with your doctor. Some medicines can make you feel dizzy. This can increase your chance of falling. Ask your doctor what other things that you can do to help prevent falls. This information is not intended to replace advice given to you by your health care provider. Make sure you discuss any questions you have with your health care provider. Document Released: 11/18/2008 Document Revised: 06/30/2015 Document Reviewed: 02/26/2014 Elsevier Interactive Patient Education  2017 [employer].

## 2022-02-19 NOTE — Progress Notes (Signed)
Subjective:   Norrin Shreffler is a 77 y.o. male who presents for Medicare Annual/Subsequent preventive examination.  Review of Systems    Virtual Visit via Telephone Note  I connected with  Mathew Postiglione on 02/19/22 at  2:00 PM EST by telephone and verified that I am speaking with the correct person using two identifiers.  Location: Patient: Home Provider: Office Persons participating in the virtual visit: patient/Nurse Health Advisor   I discussed the limitations, risks, security and privacy concerns of performing an evaluation and management service by telephone and the availability of in person appointments. The patient expressed understanding and agreed to proceed.  Interactive audio and video telecommunications were attempted between this nurse and patient, however failed, due to patient having technical difficulties OR patient did not have access to video capability.  We continued and completed visit with audio only.  Some vital signs may be absent or patient reported.   Criselda Peaches, LPN  Cardiac Risk Factors include: advanced age (>65mn, >>59women);hypertension;male gender     Objective:    Today's Vitals   02/19/22 1402  Weight: 198 lb (89.8 kg)  Height: '5\' 8"'$  (1.727 m)   Body mass index is 30.11 kg/m.     02/19/2022    2:10 PM 01/03/2021    4:12 PM 12/18/2019    3:40 PM 03/14/2016    4:43 PM  Advanced Directives  Does Patient Have a Medical Advance Directive? No No No No  Would patient like information on creating a medical advance directive? No - Patient declined  No - Patient declined     Current Medications (verified) Outpatient Encounter Medications as of 02/19/2022  Medication Sig   allopurinol (ZYLOPRIM) 300 MG tablet Take 1 tablet by mouth once daily   atenolol (TENORMIN) 100 MG tablet Take 1/2 (one-half) tablet by mouth twice daily   diltiazem (CARDIZEM CD) 180 MG 24 hr capsule Take 1 capsule (180 mg total) by mouth daily.   ibuprofen (ADVIL) 200  MG tablet Take 200 mg by mouth every 6 (six) hours as needed.   lisinopril (ZESTRIL) 40 MG tablet Take 1 tablet (40 mg total) by mouth daily.   Multiple Vitamin (MULTIVITAMIN) capsule Take 1 capsule by mouth daily.   warfarin (COUMADIN) 2.5 MG tablet TAKE 2 TABLET BY MOUTH DAILY EXCEPT  TAKE  1 1/2 TABLETS ON SUNDAYS, TUESDAYS AND THURSDAYS OR  AS  DIRECTED  BY  ANTICOAGULATION  CLINIC   No facility-administered encounter medications on file as of 02/19/2022.    Allergies (verified) Indomethacin   History: Past Medical History:  Diagnosis Date   Chronic atrial fibrillation (HCC)    Coumadin therapy   CVA (cerebral infarction)    Possible history   Essential hypertension    Gout    Overweight(278.02)    Seasonal allergies    [place]    LVEF 40-45% June 2013   History reviewed. No pertinent surgical history. Family History  Problem Relation Age of Onset   Heart disease Mother    Hypertension Father    Diabetes Paternal Aunt    Social History   Socioeconomic History   Marital status: Married    Spouse name: Not on file   Number of children: Not on file   Years of education: Not on file   Highest education level: Not on file  Occupational History   Not on file  Tobacco Use   Smoking status: Never   Smokeless tobacco: Never  Vaping Use  Vaping Use: Never used  Substance and Sexual Activity   Alcohol use: No    Alcohol/week: 0.0 standard drinks of alcohol    Comment: no use    Drug use: No   Sexual activity: Not on file  Other Topics Concern   Not on file  Social History Narrative   Lives with wife   Married x 60 years   Children; no children   Hobbies; garden   Social Determinants of Health   Financial Resource Strain: Low Risk  (02/19/2022)   Overall Financial Resource Strain (CARDIA)    Difficulty of Paying Living Expenses: Not hard at all  Food Insecurity: No Food Insecurity (02/19/2022)   Hunger Vital Sign    Worried About  Running Out of Food in the Last Year: Never true    Ran Out of Food in the Last Year: Never true  Transportation Needs: No Transportation Needs (02/19/2022)   PRAPARE - Hydrologist (Medical): No    Lack of Transportation (Non-Medical): No  Physical Activity: Inactive (02/19/2022)   Exercise Vital Sign    Days of Exercise per Week: 0 days    Minutes of Exercise per Session: 0 min  Stress: No Stress Concern Present (02/19/2022)   [place]    Feeling of Stress : Not at all  Social Connections: [place] (02/19/2022)   Social Connection and Isolation Panel [NHANES]    Frequency of Communication with Friends and Family: More than three times a week    Frequency of Social Gatherings with Friends and Family: More than three times a week    Attends Religious Services: More than 4 times per year    Active Member of [employer] or Organizations: Yes    Attends Music therapist: More than 4 times per year    Marital Status: Married    Tobacco Counseling Counseling given: Not Answered   Clinical Intake:  Pre-visit preparation completed: No  Pain : No/denies pain     BMI - recorded: 30.11 Nutritional Status: BMI > 30  Obese Nutritional Risks: None Diabetes: No  How often do you need to have someone help you when you read instructions, pamphlets, or other written materials from your doctor or pharmacy?: 1 - Never  Diabetic?  No  Interpreter Needed?: No  Information entered by :: Rolene Arbour LPN   Activities of Daily Living    02/19/2022    2:07 PM  In your present state of health, do you have any difficulty performing the following activities:  Hearing? 0  Vision? 0  Difficulty concentrating or making decisions? 0  Walking or climbing stairs? 0  Dressing or bathing? 0  Doing errands, shopping? 0  Preparing Food and eating ? N  Using the Toilet? N  In the past  six months, have you accidently leaked urine? N  Do you have problems with loss of bowel control? N  Managing your Medications? N  Managing your Finances? N  Housekeeping or managing your Housekeeping? N    Patient Care Team: [name], MD as PCP - General (Family Medicine) Satira Sark, MD as PCP - Cardiology (Cardiology) Viona Gilmore, [facility] (Inactive) as Pharmacist (Pharmacist) Lavonna Monarch, MD (Inactive) as Consulting Physician (Dermatology)  Indicate any recent Medical Services you may have received from other than [facility] providers in the past year (date may be approximate).     Assessment:   This is a routine  wellness examination for [place].  Hearing/Vision screen Hearing Screening - Comments:: Denies hearing difficulties   Vision Screening - Comments:: - up to date with routine eye exams with  Patient deferred  Dietary issues and exercise activities discussed: Current Exercise Habits: The patient does not participate in regular exercise at present, Exercise limited by: None identified   Goals Addressed               This Visit's Progress     Lose weight (pt-stated)        Eat healthy       Depression Screen    02/19/2022    2:06 PM 01/03/2021    4:13 PM 12/18/2019    3:52 PM 11/15/2017    8:36 AM 03/14/2016    5:04 PM 08/16/2014    2:23 PM  PHQ 2/9 Scores  PHQ - 2 Score 0 0 0 0 0 0  PHQ- 9 Score   0       Fall Risk    02/19/2022    2:09 PM 01/03/2021    4:12 PM 12/18/2019    3:42 PM 11/15/2017    8:36 AM 03/14/2016    5:04 PM  [place] in the past year? 0 0 1 No No  Comment   fell out of the car while car was backing up    Number falls in past yr: 0  0    Injury with Fall? 0  1    Comment   had a concussion    Risk for fall due to : No Fall Risks Medication side effect No Fall Risks    Follow up Falls prevention discussed Falls evaluation completed;Education provided;Falls prevention discussed Falls evaluation  completed;Falls prevention discussed      FALL RISK PREVENTION PERTAINING TO THE HOME:  Any stairs in or around the home? No  If so, are there any without handrails? No  Home free of loose throw rugs in walkways, pet beds, electrical cords, etc? Yes  Adequate lighting in your home to reduce risk of falls? Yes   ASSISTIVE DEVICES UTILIZED TO PREVENT FALLS:  Life alert? No  Use of a cane, [place] or w/c? No  Grab bars in the bathroom? No  Shower chair or bench in shower? No  Elevated toilet seat or a handicapped toilet? No   TIMED UP AND GO:  Was the test performed? No . Audio Visit    Cognitive Function:        02/19/2022    2:10 PM 01/03/2021    4:19 PM  6CIT Screen  What Year? 0 points 0 points  What month? 0 points 0 points  What time? 0 points 0 points  Count back from 20 0 points 0 points  Months in reverse 0 points 0 points  Repeat phrase 0 points 0 points  Total Score 0 points 0 points    Immunizations Immunization History  Administered Date(s) Administered   Moderna SARS-COV2 Booster Vaccination 02/02/2020   Moderna Sars-Covid-2 Vaccination 03/21/2019, 04/18/2019    TDAP status: Due, Education has been provided regarding the importance of this vaccine. Advised may receive this vaccine at local pharmacy or Health Dept. Aware to provide a copy of the vaccination record if obtained from local pharmacy or Health Dept. Verbalized acceptance and understanding.  Flu Vaccine status: Declined, Education has been provided regarding the importance of this vaccine but patient still declined. Advised may receive this vaccine at local pharmacy or Health Dept. Aware to  provide a copy of the vaccination record if obtained from local pharmacy or Health Dept. Verbalized acceptance and understanding.  Pneumococcal vaccine status: Declined,  Education has been provided regarding the importance of this vaccine but patient still declined. Advised may receive this vaccine at local  pharmacy or Health Dept. Aware to provide a copy of the vaccination record if obtained from local pharmacy or Health Dept. Verbalized acceptance and understanding.   Covid-19 vaccine status: Completed vaccines  Qualifies for Shingles Vaccine? Yes   Zostavax completed No   Shingrix Completed?: No.    Education has been provided regarding the importance of this vaccine. Patient has been advised to call insurance company to determine out of pocket expense if they have not yet received this vaccine. Advised may also receive vaccine at local pharmacy or Health Dept. Verbalized acceptance and understanding.  Screening Tests Health Maintenance  Topic Date Due   DTaP/Tdap/Td (1 - Tdap) Never done   COVID-19 Vaccine (3 - Moderna risk series) 03/07/2022 (Originally 03/01/2020)   INFLUENZA VACCINE  05/06/2022 (Originally 09/05/2021)   Zoster Vaccines- Shingrix (1 of 2) 05/21/2022 (Originally 03/30/1964)   Pneumonia Vaccine 41+ Years old (1 - PCV) 02/20/2023 (Originally 03/29/2009)   Medicare Annual Wellness (AWV)  02/20/2023   Hepatitis C Screening  Completed   HPV VACCINES  Aged Out   COLONOSCOPY (Pts 45-56yr Insurance coverage will need to be confirmed)  Discontinued    Health Maintenance  Health Maintenance Due  Topic Date Due   DTaP/Tdap/Td (1 - Tdap) Never done    Colorectal cancer screening: No longer required.   Lung Cancer Screening: (Low Dose CT Chest recommended if Age 78-80years, 30 pack-year currently smoking OR have quit w/in 15years.) does not qualify.     Additional Screening:  Hepatitis C Screening: does qualify; Completed 11/15/17  Vision Screening: Recommended annual ophthalmology exams for early detection of glaucoma and other disorders of the eye. Is the patient up to date with their annual eye exam?  No  Who is the provider or what is the name of the office in which the patient attends annual eye exams? Patient deferred If pt is not established with a provider, would  they like to be referred to a provider to establish care? No .   Dental Screening: Recommended annual dental exams for proper oral hygiene  Community Resource Referral / Chronic Care Management:  CRR required this visit?  No   CCM required this visit?  No      Plan:     I have personally reviewed and noted the following in the patient's chart:   Medical and social history Use of alcohol, tobacco or illicit drugs  Current medications and supplements including opioid prescriptions. Patient is not currently taking opioid prescriptions. Functional ability and status Nutritional status Physical activity Advanced directives List of other physicians Hospitalizations, surgeries, and ER visits in previous 12 months Vitals Screenings to include cognitive, depression, and falls Referrals and appointments  In addition, I have reviewed and discussed with patient certain preventive protocols, quality metrics, and best practice recommendations. A written personalized care plan for preventive services as well as general preventive health recommendations were provided to patient.     BCriselda Peaches LPN   [id number]  Nurse Notes: None

## 2022-03-05 ENCOUNTER — Other Ambulatory Visit: Payer: Self-pay | Admitting: Family Medicine

## 2022-03-05 DIAGNOSIS — M1A00X0 Idiopathic chronic gout, unspecified site, without tophus (tophi): Secondary | ICD-10-CM

## 2022-03-07 ENCOUNTER — Ambulatory Visit (INDEPENDENT_AMBULATORY_CARE_PROVIDER_SITE_OTHER): Payer: Medicare Other

## 2022-03-07 DIAGNOSIS — Z7901 Long term (current) use of anticoagulants: Secondary | ICD-10-CM

## 2022-03-07 LAB — POCT INR: INR: 2 (ref 2.0–3.0)

## 2022-03-07 NOTE — Patient Instructions (Addendum)
Pre visit review using our clinic review tool, if applicable. No additional management support is needed unless otherwise documented below in the visit note.  Continue 2 tablets daily except take 1 1/2 tablets on Tuesdays, Thursdays, and Sundays. Recheck in 6 weeks.

## 2022-03-07 NOTE — Progress Notes (Signed)
Continue 2 tablets daily except take 1 1/2 tablets on Tuesdays, Thursdays, and Sundays. Recheck in 6 weeks.

## 2022-03-14 ENCOUNTER — Other Ambulatory Visit: Payer: Self-pay | Admitting: Family Medicine

## 2022-03-14 DIAGNOSIS — M1A00X0 Idiopathic chronic gout, unspecified site, without tophus (tophi): Secondary | ICD-10-CM

## 2022-03-28 ENCOUNTER — Ambulatory Visit: Payer: Medicare Other | Admitting: Cardiology

## 2022-04-12 ENCOUNTER — Other Ambulatory Visit: Payer: Self-pay | Admitting: Family Medicine

## 2022-04-12 DIAGNOSIS — I10 Essential (primary) hypertension: Secondary | ICD-10-CM

## 2022-04-18 ENCOUNTER — Ambulatory Visit (INDEPENDENT_AMBULATORY_CARE_PROVIDER_SITE_OTHER): Payer: Medicare Other

## 2022-04-18 DIAGNOSIS — Z7901 Long term (current) use of anticoagulants: Secondary | ICD-10-CM | POA: Diagnosis not present

## 2022-04-18 LAB — POCT INR: INR: 2.1 (ref 2.0–3.0)

## 2022-04-18 NOTE — Progress Notes (Signed)
Continue 2 tablets daily except take 1 1/2 tablets on Tuesdays, Thursdays, and Sundays. Recheck in 6 weeks.  

## 2022-04-18 NOTE — Patient Instructions (Addendum)
Pre visit review using our clinic review tool, if applicable. No additional management support is needed unless otherwise documented below in the visit note.  Continue 2 tablets daily except take 1 1/2 tablets on Tuesdays, Thursdays, and Sundays. Recheck in 6 weeks.  

## 2022-05-10 ENCOUNTER — Other Ambulatory Visit: Payer: Self-pay

## 2022-05-10 MED ORDER — DILTIAZEM HCL ER COATED BEADS 180 MG PO CP24: 180.0000 mg | ORAL_CAPSULE | Freq: Every day | ORAL | 0 refills | Status: DC

## 2022-05-30 ENCOUNTER — Ambulatory Visit (INDEPENDENT_AMBULATORY_CARE_PROVIDER_SITE_OTHER): Payer: Medicare Other

## 2022-05-30 DIAGNOSIS — Z7901 Long term (current) use of anticoagulants: Secondary | ICD-10-CM | POA: Diagnosis not present

## 2022-05-30 LAB — POCT INR: INR: 1.5 — AB (ref 2.0–3.0)

## 2022-05-30 NOTE — Progress Notes (Signed)
Pt knows he missed a 2 tablet day and may have missed another day. He is not sure. Has been eating a lot of salad greens from the garden. Increase dose today to take 3 tablets and increase dose tomorrow to take 2 tablets and the continue 2 tablets daily except take 1 1/2 tablets on Tuesdays, Thursdays, and Sundays. Recheck in 3 weeks per pt request.

## 2022-05-30 NOTE — Patient Instructions (Addendum)
Pre visit review using our clinic review tool, if applicable. No additional management support is needed unless otherwise documented below in the visit note.  Increase dose today to take 3 tablets and increase dose tomorrow to take 2 tablets and the continue 2 tablets daily except take 1 1/2 tablets on Tuesdays, Thursdays, and Sundays. Recheck in 3 weeks.

## 2022-05-31 ENCOUNTER — Encounter: Payer: Self-pay | Admitting: Cardiology

## 2022-05-31 ENCOUNTER — Ambulatory Visit: Payer: Medicare Other | Attending: Cardiology | Admitting: Cardiology

## 2022-05-31 VITALS — BP 148/92 | HR 100 | Ht 68.0 in | Wt 205.2 lb

## 2022-05-31 DIAGNOSIS — Z8679 Personal history of other diseases of the circulatory system: Secondary | ICD-10-CM

## 2022-05-31 DIAGNOSIS — I4821 Permanent atrial fibrillation: Secondary | ICD-10-CM | POA: Diagnosis not present

## 2022-05-31 NOTE — Patient Instructions (Signed)
Medication Instructions:  Your physician recommends that you continue on your current medications as directed. Please refer to the Current Medication list given to you today.   Labwork: None today  Testing/Procedures: Your physician has requested that you have an echocardiogram. Echocardiography is a painless test that uses sound waves to create images of your heart. It provides your doctor with information about the size and shape of your heart and how well your heart's chambers and valves are working. This procedure takes approximately one hour. There are no restrictions for this procedure. Please do NOT wear cologne, perfume, aftershave, or lotions (deodorant is allowed). Please arrive 15 minutes prior to your appointment time.   Follow-Up: 6 months  Any Other Special Instructions Will Be Listed Below (If Applicable).  If you need a refill on your cardiac medications before your next appointment, please call your pharmacy.  

## 2022-05-31 NOTE — Progress Notes (Signed)
    Cardiology Office Note  Date: 05/31/2022   ID: Jose Bonilla, DOB [birth year], MRN [id number]  History of Present Illness: Jose Bonilla is a 77 y.o. male last seen in August 2023 by Ms. Lilla Shook, I reviewed the note.  He is here for a routine visit.  Reports no sense of palpitations or chest pain, stable NYHA class II dyspnea.  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  Enjoys his work.  He is on Coumadin for stroke prophylaxis with follow-up by PCP.  He does not report any spontaneous bleeding problems.  States that he checks his blood pressure at home, average systolic is around 140.  ECG today shows atrial fibrillation with PVC.  I reviewed his medications, he reports compliance with therapy.  Physical Exam: VS:  BP (!) 148/92   Pulse 100   Ht  (1.727 m)   Wt 205 lb 3.2 oz (93.1 kg)   SpO2 98%   BMI 31.20 kg/m , BMI Body mass index is 31.2 kg/m.  Wt Readings from Last 3 Encounters:  05/31/22 205 lb 3.2 oz (93.1 kg)  02/19/22 198 lb (89.8 kg)  09/18/21 206 lb (93.4 kg)    General: Patient appears comfortable at rest. HEENT: Conjunctiva and lids normal. Neck: Supple, no elevated JVP or carotid bruits. Lungs: Clear to auscultation, nonlabored breathing at rest. Cardiac: Irregularly irregular, 1/6 systolic murmur. Extremities: No pitting edema.  ECG:  An ECG dated 10/13/2020 was personally reviewed today and demonstrated:  Atrial fibrillation.  Labwork: 09/18/2021: BUN 19; Creatinine, Ser 0.90; Hemoglobin 14.0; Platelets 166; Potassium 4.1; Sodium 139     Component Value Date/Time   CHOL 155 03/15/2021 1503   TRIG 188.0 (H) 03/15/2021 1503   HDL 37.60 (L) 03/15/2021 1503   CHOLHDL 4 03/15/2021 1503   VLDL 37.6 03/15/2021 1503   LDLCALC 79 03/15/2021 1503   LDLDIRECT 67.0 08/16/2014 1517   Other Studies Reviewed Today:  No interval cardiac testing for review today.  Assessment and Plan:  1.  Permanent atrial fibrillation with  CHA2DS2-VASc score of 5.  He is asymptomatic without sense of palpitations and remains on medical therapy and including Coumadin for stroke prophylaxis with follow-up by PCP, and combination of Cardizem CD with atenolol for heart rate control.  No changes were made today.  2.  History of nonischemic cardiomyopathy with normalization of LVEF.  Last echocardiogram in December 2021 revealed LVEF 60 to 65% without regional wall motion abnormalities, moderate LVH.  Plan to update echocardiogram.  3.  Essential hypertension, on multimodal therapy.  Keep follow-up with PCP.  Disposition:  Follow up  6 months.  Signed, Jonelle Sidle, M.D., F.A.C.C. [facility]

## 2022-06-20 ENCOUNTER — Ambulatory Visit: Payer: Medicare Other

## 2022-06-27 ENCOUNTER — Ambulatory Visit (INDEPENDENT_AMBULATORY_CARE_PROVIDER_SITE_OTHER): Payer: Medicare Other

## 2022-06-27 ENCOUNTER — Ambulatory Visit (HOSPITAL_COMMUNITY): Admission: RE | Admit: 2022-06-27 | Payer: Medicare Other | Source: Ambulatory Visit

## 2022-06-27 DIAGNOSIS — Z7901 Long term (current) use of anticoagulants: Secondary | ICD-10-CM

## 2022-06-27 LAB — POCT INR: INR: 3.1 — AB (ref 2.0–3.0)

## 2022-06-27 NOTE — Patient Instructions (Addendum)
Pre visit review using our clinic review tool, if applicable. No additional management support is needed unless otherwise documented below in the visit note.  Decrease dose today to take 1 tablet and the continue 2 tablets daily except take 1 1/2 tablets on Tuesdays, Thursdays, and Sundays. Recheck in 4 weeks.

## 2022-06-27 NOTE — Progress Notes (Signed)
Pt reports being out of town lately and not eating as many greens as he normally does.  Decrease dose today to take 1 tablet and the continue 2 tablets daily except take 1 1/2 tablets on Tuesdays, Thursdays, and Sundays. Recheck in 4 weeks.

## 2022-07-09 ENCOUNTER — Other Ambulatory Visit: Payer: Self-pay | Admitting: Family Medicine

## 2022-07-09 DIAGNOSIS — I10 Essential (primary) hypertension: Secondary | ICD-10-CM

## 2022-07-25 ENCOUNTER — Ambulatory Visit: Payer: Medicare Other

## 2022-07-26 ENCOUNTER — Telehealth: Payer: Self-pay

## 2022-07-26 NOTE — Telephone Encounter (Signed)
Pt missed coumadin clinic apt yesterday. Tried to contact pt to RS. No answer and no VM.

## 2022-07-31 NOTE — Telephone Encounter (Signed)
[facility] apt for tomorrow.

## 2022-08-01 ENCOUNTER — Ambulatory Visit (INDEPENDENT_AMBULATORY_CARE_PROVIDER_SITE_OTHER): Payer: Medicare Other

## 2022-08-01 DIAGNOSIS — Z7901 Long term (current) use of anticoagulants: Secondary | ICD-10-CM | POA: Diagnosis not present

## 2022-08-01 LAB — POCT INR: INR: 1.4 — AB (ref 2.0–3.0)

## 2022-08-01 NOTE — Progress Notes (Signed)
Pt reports he has been eating a lot of greens lately, "all that was left in my freezer." Pt also reported he obtained a cut at work on his finger and he thought his blood looked thin so he reduced the dose of his warfarin. Pt reports he has done this in the past. Advised pt importance of following dosing instructions and if he is concerned about his INR being high to contact coumadin clinic to check INR. Advised importance of keeping vitamin K containing foods consistent in his diet.Pt verbalized understanding and apologized for changing dosing.  Increase dose today to take 3 tablets and increase dose tomorrow to take 2 tablets and then continue 2 tablets daily except take 1 1/2 tablets on Tuesdays, Thursdays, and Sundays. Recheck in 2 weeks.

## 2022-08-01 NOTE — Patient Instructions (Addendum)
Pre visit review using our clinic review tool, if applicable. No additional management support is needed unless otherwise documented below in the visit note.  Increase dose today to take 3 tablets and increase dose tomorrow to take 2 tablets and then continue 2 tablets daily except take 1 1/2 tablets on Tuesdays, Thursdays, and Sundays. Recheck in 2 weeks.

## 2022-08-03 ENCOUNTER — Other Ambulatory Visit: Payer: Self-pay | Admitting: Physician Assistant

## 2022-08-12 ENCOUNTER — Other Ambulatory Visit: Payer: Self-pay | Admitting: Family Medicine

## 2022-08-12 DIAGNOSIS — I10 Essential (primary) hypertension: Secondary | ICD-10-CM

## 2022-08-15 ENCOUNTER — Ambulatory Visit (INDEPENDENT_AMBULATORY_CARE_PROVIDER_SITE_OTHER): Payer: Medicare Other

## 2022-08-15 DIAGNOSIS — Z7901 Long term (current) use of anticoagulants: Secondary | ICD-10-CM

## 2022-08-15 LAB — POCT INR: INR: 2 (ref 2.0–3.0)

## 2022-08-15 NOTE — Progress Notes (Signed)
Continue 2 tablets daily except take 1 1/2 tablets on Tuesdays, Thursdays, and Sundays. Recheck in 4 weeks.

## 2022-08-15 NOTE — Patient Instructions (Addendum)
Pre visit review using our clinic review tool, if applicable. No additional management support is needed unless otherwise documented below in the visit note.  Continue 2 tablets daily except take 1 1/2 tablets on Tuesdays, Thursdays, and Sundays. Recheck in 4 weeks.

## 2022-09-12 ENCOUNTER — Ambulatory Visit (INDEPENDENT_AMBULATORY_CARE_PROVIDER_SITE_OTHER): Payer: Medicare Other

## 2022-09-12 DIAGNOSIS — Z7901 Long term (current) use of anticoagulants: Secondary | ICD-10-CM

## 2022-09-12 LAB — POCT INR: INR: 1.8 — AB (ref 2.0–3.0)

## 2022-09-12 NOTE — Progress Notes (Signed)
Pt reports he is eating a lot of greens lately and will continue because everything is fresh out of the garden now.  Pt reports he thought he was "thin" so he changed his dosing the last two weeks. Dosing he changed to is on the calendar. This has happened in the past with pt self dosing. Discussed with pt his concerns for being too thin and coming to the clinic for INR check if he is concerned. Advised he should not change dosing unless instructed to change it. Pt verbalized understanding.  Dosing has been increased and pt reports he will follow dosing.  Increase dose today to take 1 1/2 tablet today and then change weekly dose to take 1 tablet daily except take 2 tablets on Mondays, Wednesday, and Fridays. Recheck in 2 weeks.

## 2022-09-12 NOTE — Patient Instructions (Addendum)
Pre visit review using our clinic review tool, if applicable. No additional management support is needed unless otherwise documented below in the visit note.  Increase dose today to take 1 1/2 tablet today and then change weekly dose to take 1 tablet daily except take 2 tablets on Mondays, Wednesday, and Fridays. Recheck in 2 weeks.

## 2022-09-26 ENCOUNTER — Ambulatory Visit: Payer: Medicare Other

## 2022-10-03 ENCOUNTER — Ambulatory Visit (INDEPENDENT_AMBULATORY_CARE_PROVIDER_SITE_OTHER): Payer: Medicare Other

## 2022-10-03 DIAGNOSIS — Z7901 Long term (current) use of anticoagulants: Secondary | ICD-10-CM | POA: Diagnosis not present

## 2022-10-03 LAB — POCT INR: INR: 1.7 — AB (ref 2.0–3.0)

## 2022-10-03 NOTE — Patient Instructions (Addendum)
Pre visit review using our clinic review tool, if applicable. No additional management support is needed unless otherwise documented below in the visit note.  Increase dose today to take 3 tablet today and then change weekly dose to take 2 tablet daily except take 1 tablets on Mondays, Wednesday, and Fridays. Recheck in 3 weeks.

## 2022-10-03 NOTE — Progress Notes (Signed)
Pt has been eating a lot of kale. Educated pt on importance of eating vitamin K foods consistently. Pt verbalized understanding.  Increase dose today to take 3 tablet today and then change weekly dose to take 2 tablet daily except take 1 tablets on Mondays, Wednesday, and Fridays. Recheck in 3 weeks.

## 2022-10-23 ENCOUNTER — Telehealth: Payer: Self-pay

## 2022-10-23 NOTE — Telephone Encounter (Signed)
Pt called to RS coumadin clinic apt from tomorrow to next week due to him having to go out of town to help family.  RS apt. Advised if any changes to contact coumadin clinic. Pt denies any changes currently. Pt verbalized understanding.

## 2022-10-24 ENCOUNTER — Ambulatory Visit: Payer: Medicare Other

## 2022-10-31 ENCOUNTER — Ambulatory Visit: Payer: Medicare Other

## 2022-11-05 ENCOUNTER — Other Ambulatory Visit: Payer: Self-pay | Admitting: Physician Assistant

## 2022-11-07 ENCOUNTER — Ambulatory Visit: Payer: Medicare Other

## 2022-11-07 DIAGNOSIS — Z7901 Long term (current) use of anticoagulants: Secondary | ICD-10-CM

## 2022-11-07 LAB — POCT INR: INR: 2.7 (ref 2.0–3.0)

## 2022-11-07 NOTE — Progress Notes (Signed)
Continue 2 tablet daily except take 1 tablets on Mondays, Wednesday, and Fridays. Recheck in 4 weeks.

## 2022-11-07 NOTE — Patient Instructions (Addendum)
Pre visit review using our clinic review tool, if applicable. No additional management support is needed unless otherwise documented below in the visit note.  Continue 2 tablet daily except take 1 tablets on Mondays, Wednesday, and Fridays. Recheck in 4 weeks.

## 2022-11-20 ENCOUNTER — Other Ambulatory Visit: Payer: Self-pay | Admitting: Family Medicine

## 2022-11-20 DIAGNOSIS — I10 Essential (primary) hypertension: Secondary | ICD-10-CM

## 2022-12-05 ENCOUNTER — Ambulatory Visit: Payer: Medicare Other

## 2022-12-12 ENCOUNTER — Ambulatory Visit: Payer: Medicare Other

## 2022-12-12 DIAGNOSIS — Z7901 Long term (current) use of anticoagulants: Secondary | ICD-10-CM | POA: Diagnosis not present

## 2022-12-12 LAB — POCT INR: INR: 1.5 — AB (ref 2.0–3.0)

## 2022-12-12 NOTE — Progress Notes (Cosign Needed Addendum)
Pt missed dose 3 days ago. Increase dose today to take 1 1/2 tablets and increase dose tomorrow to take 3 tablets and then continue 2 tablet daily except take 1 tablets on Mondays, Wednesday, and Fridays. Recheck in 2 weeks.  Pt reported he also needs an OV with PCP for medication refills. Made apt with PCP for same day as coumadin clinic.

## 2022-12-12 NOTE — Patient Instructions (Addendum)
Pre visit review using our clinic review tool, if applicable. No additional management support is needed unless otherwise documented below in the visit note.  Increase dose today to take 1 1/2 tablets and increase dose tomorrow to take 3 tablets and then continue 2 tablet daily except take 1 tablets on Mondays, Wednesday, and Fridays. Recheck in 2 weeks.

## 2022-12-18 ENCOUNTER — Other Ambulatory Visit: Payer: Self-pay | Admitting: Family Medicine

## 2022-12-18 DIAGNOSIS — I10 Essential (primary) hypertension: Secondary | ICD-10-CM

## 2022-12-19 ENCOUNTER — Other Ambulatory Visit: Payer: Self-pay | Admitting: Family Medicine

## 2022-12-19 DIAGNOSIS — M1A00X0 Idiopathic chronic gout, unspecified site, without tophus (tophi): Secondary | ICD-10-CM

## 2022-12-24 DIAGNOSIS — X32XXXA Exposure to sunlight, initial encounter: Secondary | ICD-10-CM | POA: Diagnosis not present

## 2022-12-24 DIAGNOSIS — L570 Actinic keratosis: Secondary | ICD-10-CM | POA: Diagnosis not present

## 2022-12-24 DIAGNOSIS — C44311 Basal cell carcinoma of skin of nose: Secondary | ICD-10-CM | POA: Diagnosis not present

## 2022-12-26 ENCOUNTER — Ambulatory Visit: Payer: Medicare Other | Admitting: Family Medicine

## 2022-12-26 ENCOUNTER — Ambulatory Visit (INDEPENDENT_AMBULATORY_CARE_PROVIDER_SITE_OTHER): Payer: Medicare Other

## 2022-12-26 DIAGNOSIS — Z7901 Long term (current) use of anticoagulants: Secondary | ICD-10-CM | POA: Diagnosis not present

## 2022-12-26 LAB — POCT INR: INR: 2.5 (ref 2.0–3.0)

## 2022-12-26 NOTE — Progress Notes (Signed)
Continue 2 tablet daily except take 1 tablets on Mondays, Wednesday, and Fridays. Recheck in 4 weeks.

## 2022-12-26 NOTE — Patient Instructions (Addendum)
Pre visit review using our clinic review tool, if applicable. No additional management support is needed unless otherwise documented below in the visit note.  Continue 2 tablet daily except take 1 tablets on Mondays, Wednesday, and Fridays. Recheck in 4 weeks.

## 2023-01-09 ENCOUNTER — Ambulatory Visit: Payer: Medicare Other | Admitting: Family Medicine

## 2023-01-09 ENCOUNTER — Encounter: Payer: Self-pay | Admitting: Family Medicine

## 2023-01-09 VITALS — BP 128/78 | HR 94 | Temp 97.5°F | Ht 68.0 in | Wt 212.4 lb

## 2023-01-09 DIAGNOSIS — R739 Hyperglycemia, unspecified: Secondary | ICD-10-CM

## 2023-01-09 DIAGNOSIS — I10 Essential (primary) hypertension: Secondary | ICD-10-CM

## 2023-01-09 DIAGNOSIS — M1A9XX1 Chronic gout, unspecified, with tophus (tophi): Secondary | ICD-10-CM | POA: Diagnosis not present

## 2023-01-09 DIAGNOSIS — I4891 Unspecified atrial fibrillation: Secondary | ICD-10-CM | POA: Diagnosis not present

## 2023-01-09 LAB — CBC WITH DIFFERENTIAL/PLATELET
Basophils Absolute: 0.1 10*3/uL (ref 0.0–0.1)
Basophils Relative: 1.1 % (ref 0.0–3.0)
Eosinophils Absolute: 0.3 10*3/uL (ref 0.0–0.7)
Eosinophils Relative: 5.5 % — ABNORMAL HIGH (ref 0.0–5.0)
HCT: 42.4 % (ref 39.0–52.0)
Hemoglobin: 13.6 g/dL (ref 13.0–17.0)
Lymphocytes Relative: 19.6 % (ref 12.0–46.0)
Lymphs Abs: 1.2 10*3/uL (ref 0.7–4.0)
MCHC: 32 g/dL (ref 30.0–36.0)
MCV: 88.9 fL (ref 78.0–100.0)
Monocytes Absolute: 0.5 10*3/uL (ref 0.1–1.0)
Monocytes Relative: 7.4 % (ref 3.0–12.0)
Neutro Abs: 4.1 10*3/uL (ref 1.4–7.7)
Neutrophils Relative %: 66.4 % (ref 43.0–77.0)
Platelets: 176 10*3/uL (ref 150.0–400.0)
RBC: 4.77 Mil/uL (ref 4.22–5.81)
RDW: 17.1 % — ABNORMAL HIGH (ref 11.5–15.5)
WBC: 6.2 10*3/uL (ref 4.0–10.5)

## 2023-01-09 LAB — BASIC METABOLIC PANEL
BUN: 16 mg/dL (ref 6–23)
CO2: 29 meq/L (ref 19–32)
Calcium: 8.7 mg/dL (ref 8.4–10.5)
Chloride: 104 meq/L (ref 96–112)
Creatinine, Ser: 0.96 mg/dL (ref 0.40–1.50)
GFR: 75.56 mL/min (ref >60.00)
Glucose, Bld: 87 mg/dL (ref 70–99)
Potassium: 4.1 meq/L (ref 3.5–5.1)
Sodium: 138 meq/L (ref 135–145)

## 2023-01-09 LAB — HEMOGLOBIN A1C: Hgb A1c MFr Bld: 5.8 % (ref 4.6–6.5)

## 2023-01-09 LAB — HEPATIC FUNCTION PANEL
ALT: 13 U/L (ref 0–53)
AST: 17 U/L (ref 0–37)
Albumin: 3.8 g/dL (ref 3.5–5.2)
Alkaline Phosphatase: 74 U/L (ref 39–117)
Bilirubin, Direct: 0.2 mg/dL (ref 0.0–0.3)
Total Bilirubin: 0.8 mg/dL (ref 0.2–1.2)
Total Protein: 6.2 g/dL (ref 6.0–8.3)

## 2023-01-09 LAB — URIC ACID: Uric Acid, Serum: 5.7 mg/dL (ref 4.0–7.8)

## 2023-01-09 NOTE — Progress Notes (Signed)
Established Patient Office Visit  Subjective   Patient ID: Jose Bonilla, male    DOB: 03-27-1945  Age: 77 y.o. MRN: [id number]  No chief complaint on file.   HPI   Mr. Aprahamian seen for medical follow-up.  He has history of atrial fibrillation, hypertension, remote history of CVA, gout, chronic Coumadin therapy.  Followed by Coumadin clinic.  Has gotten regular INR's but has not had any other recent blood work.  Medications include allopurinol 300 mg daily, atenolol 100 mg 1/2 tablet twice daily, diltiazem 180 mg daily, lisinopril 40 mg daily, and Coumadin.  Still works full-time.  Does sound systems for [employer]  Recent poor compliance with diet and has gained some weight.  No other specific complaints.  No recent acute gout flareups.  Chronic tophaceous changes left elbow.  He is taking allopurinol 300 mg daily  Denies any recent chest pains or dizziness  Past Medical History:  Diagnosis Date   Chronic atrial fibrillation (HCC)    Coumadin therapy   CVA (cerebral infarction)    Possible history   Essential hypertension    Gout    Overweight(278.02)    Seasonal allergies    Secondary cardiomyopathy (HCC)    LVEF 40-45% June 2013   History reviewed. No pertinent surgical history.  reports that he has never smoked. He has never used smokeless tobacco. He reports that he does not drink alcohol and does not use drugs. family history includes Diabetes in his paternal aunt; Heart disease in his mother; Hypertension in his father. Allergies  Allergen Reactions   Indomethacin Other (See Comments)    Increased heart rate and elevated BP per patient    Review of Systems  Constitutional:  Negative for malaise/fatigue.  Eyes:  Negative for blurred vision.  Respiratory:  Negative for shortness of breath.   Cardiovascular:  Negative for chest pain.  Neurological:  Negative for dizziness, weakness and headaches.      Objective:     BP 128/78 (BP Location: Left Arm, Cuff Size:  Normal)   Pulse 94   Temp (!) 97.5 F (36.4 C) (Oral)   Ht 5\' 8"  (1.727 m)   Wt 212 lb 6.4 oz (96.3 kg)   SpO2 98%   BMI 32.30 kg/m  BP Readings from Last 3 Encounters:  01/09/23 128/78  05/31/22 (!) 148/92  09/18/21 (!) 144/78   Wt Readings from Last 3 Encounters:  01/09/23 212 lb 6.4 oz (96.3 kg)  05/31/22 205 lb 3.2 oz (93.1 kg)  02/19/22 198 lb (89.8 kg)      Physical Exam Constitutional:      Appearance: He is well-developed.  Eyes:     Pupils: Pupils are equal, round, and reactive to light.  Neck:     Thyroid: No thyromegaly.  Cardiovascular:     Rate and Rhythm: Normal rate.     Comments: Irregular rhythm consistent with atrial fibrillation Pulmonary:     Effort: Pulmonary effort is normal. No respiratory distress.     Breath sounds: Normal breath sounds. No wheezing or rales.  Musculoskeletal:     Cervical back: Neck supple.     Comments: Chronic tophaceous changes left elbow olecranon bursa.  No erythema.  Nontender.  Neurological:     Mental Status: He is alert and oriented to person, place, and time.      No results found for any visits on 01/09/23.    The ASCVD Risk score (Arnett DK, et al., 2019) failed to calculate for the following  reasons:   The patient has a prior MI or stroke diagnosis    Assessment & Plan:   #1 hypertension.  Initial blood pressure up slightly and repeat improved after rest.  Continue multidrug regimen as above.  Encouraged to lose some weight.  Continue close monitoring Check labs today including basic metabolic panel  #2 history of chronic atrial fibrillation.  Patient on diltiazem and atenolol.  Heart rate stable.  Continue chronic Coumadin therapy.  He is still followed by cardiologist for this  #3 chronic gout.  Tophaceous changes left elbow.  Continue allopurinol 300 mg daily.  Check uric acid level.  #4 history of mild hyperglycemia on previous labs.  Check A1c today  Offered flu vaccine and he declines No  follow-ups on file.    Evelena Peat, MD

## 2023-01-14 ENCOUNTER — Other Ambulatory Visit: Payer: Self-pay | Admitting: Family Medicine

## 2023-01-14 ENCOUNTER — Telehealth: Payer: Self-pay | Admitting: Family Medicine

## 2023-01-14 DIAGNOSIS — I10 Essential (primary) hypertension: Secondary | ICD-10-CM

## 2023-01-14 NOTE — Telephone Encounter (Signed)
Pt is calling and would like blood work result 

## 2023-01-15 NOTE — Telephone Encounter (Signed)
Please see result note 

## 2023-01-23 ENCOUNTER — Ambulatory Visit: Payer: Medicare Other

## 2023-01-25 ENCOUNTER — Telehealth: Payer: Self-pay

## 2023-01-25 NOTE — Telephone Encounter (Signed)
Pt NS coumadin clinic apt on 12/18. Tried to contact pt to RS apt but no answer and no VM available.

## 2023-02-05 ENCOUNTER — Other Ambulatory Visit: Payer: Self-pay | Admitting: Family Medicine

## 2023-02-05 DIAGNOSIS — M1A00X0 Idiopathic chronic gout, unspecified site, without tophus (tophi): Secondary | ICD-10-CM

## 2023-02-12 NOTE — Telephone Encounter (Signed)
 Contacted pt to RS coumadin clinic apt. RS for tomorrow. Pt verbalized understanding.

## 2023-02-13 ENCOUNTER — Ambulatory Visit: Payer: Medicare Other

## 2023-02-13 DIAGNOSIS — Z7901 Long term (current) use of anticoagulants: Secondary | ICD-10-CM

## 2023-02-13 LAB — POCT INR: INR: 1.2 — AB (ref 2.0–3.0)

## 2023-02-13 NOTE — Patient Instructions (Addendum)
 Pre visit review using our clinic review tool, if applicable. No additional management support is needed unless otherwise documented below in the visit note.  Increase dose tomorrow to take 3 tablets and then continue 2 tablet daily except take 1 tablets on Mondays, Wednesday, and Fridays. Recheck in 1 weeks.

## 2023-02-13 NOTE — Progress Notes (Addendum)
 Pt reports missing 3-4 doses about 3 days ago, but he is not certain of exactly how many dose he missed and how long ago.  Pt reports he only took 1 tablet yesterday and also took 2 tablets today which is not what was advised at last apt.  Advised pt on s/s of a clot/stroke. Pt verbalized understanding.  Increase dose tomorrow to take 3 tablets and then continue 2 tablet daily except take 1 tablets on Mondays, Wednesday, and Fridays. Recheck in 1 weeks.

## 2023-02-19 ENCOUNTER — Telehealth: Payer: Self-pay

## 2023-02-19 DIAGNOSIS — Z7901 Long term (current) use of anticoagulants: Secondary | ICD-10-CM

## 2023-02-19 MED ORDER — WARFARIN SODIUM 2.5 MG PO TABS: ORAL_TABLET | ORAL | 1 refills | Status: DC

## 2023-02-19 NOTE — Telephone Encounter (Signed)
 Received fax from pharmacy requesting refill of warfarin, 2.5 mg.  Pt is compliant with warfarin management and PCP apts.  Sent in refill of warfarin to requested pharmacy.

## 2023-02-19 NOTE — Telephone Encounter (Signed)
 Patient would like to be contacted on his mobile number instead of the shop number

## 2023-02-20 ENCOUNTER — Ambulatory Visit (INDEPENDENT_AMBULATORY_CARE_PROVIDER_SITE_OTHER): Payer: Medicare Other

## 2023-02-20 DIAGNOSIS — Z7901 Long term (current) use of anticoagulants: Secondary | ICD-10-CM

## 2023-02-20 LAB — POCT INR: INR: 3.4 — AB (ref 2.0–3.0)

## 2023-02-20 NOTE — Progress Notes (Addendum)
 Pt reports he thought he missed his 2 tablet dose on Saturday of last week so he took 2 additional tablets. Educated pt again on importance of taking as prescribed and gave pt a calendar to keep up with taking medication daily. Advised to check off on calendar when he has taken the dose for that day and to make sure he checks the calendar to verify what dose he should be taking. Pt verbalized understanding.  Pt already took warfarin today.  Due to this increase in dosing pt took there is not change in weekly dose. Pt will hold dose tomorrow and we weill recheck in 2 weeks.  Hold dose tomorrow and then continue 2 tablet daily except take 1 tablets on Mondays, Wednesday, and Fridays. Recheck in 2 weeks.

## 2023-02-20 NOTE — Patient Instructions (Addendum)
 Pre visit review using our clinic review tool, if applicable. No additional management support is needed unless otherwise documented below in the visit note.  Hold dose tomorrow and then continue 2 tablet daily except take 1 tablets on Mondays, Wednesday, and Fridays. Recheck in 2 weeks.

## 2023-03-04 ENCOUNTER — Telehealth: Payer: Self-pay

## 2023-03-04 NOTE — Telephone Encounter (Signed)
Unsuccessful attempts to reach patient on preferred numbers listed in notes for scheduled AWV. Left message on voicemail okay to reschedule.

## 2023-03-06 ENCOUNTER — Ambulatory Visit (INDEPENDENT_AMBULATORY_CARE_PROVIDER_SITE_OTHER): Payer: Medicare Other

## 2023-03-06 DIAGNOSIS — Z7901 Long term (current) use of anticoagulants: Secondary | ICD-10-CM

## 2023-03-06 LAB — POCT INR: INR: 5.5 — AB (ref 2.0–3.0)

## 2023-03-06 NOTE — Patient Instructions (Addendum)
Pre visit review using our clinic review tool, if applicable. No additional management support is needed unless otherwise documented below in the visit note.  Hold dose tomorrow and hold dose the day after tomorrow and then change weekly dose to take 1 tablet daily except take 2 tablets on Mondays, Wednesday, and Fridays. Recheck in 2 weeks.

## 2023-03-06 NOTE — Progress Notes (Signed)
Pt reports he has been taking a lot of Advil for sinus pressure. Advised pt not to use ibuprofen due to major interaction. Pt said he was not aware. Pt denies any bleeding or abnormal bruising. Advised if any s/s of bleeding or abnormal bruising to go to the ER. Pt verbalized understanding.  Pt already took warfarin today.  Hold dose tomorrow and hold dose the day after tomorrow and then change weekly dose to take 1 tablet daily except take 2 tablets on Mondays, Wednesday, and Fridays. Recheck in 2 weeks.

## 2023-03-11 DIAGNOSIS — L570 Actinic keratosis: Secondary | ICD-10-CM | POA: Diagnosis not present

## 2023-03-11 DIAGNOSIS — Z85828 Personal history of other malignant neoplasm of skin: Secondary | ICD-10-CM | POA: Diagnosis not present

## 2023-03-11 DIAGNOSIS — Z08 Encounter for follow-up examination after completed treatment for malignant neoplasm: Secondary | ICD-10-CM | POA: Diagnosis not present

## 2023-03-11 DIAGNOSIS — X32XXXD Exposure to sunlight, subsequent encounter: Secondary | ICD-10-CM | POA: Diagnosis not present

## 2023-03-18 ENCOUNTER — Ambulatory Visit (INDEPENDENT_AMBULATORY_CARE_PROVIDER_SITE_OTHER): Payer: Medicare Other

## 2023-03-18 DIAGNOSIS — Z7901 Long term (current) use of anticoagulants: Secondary | ICD-10-CM | POA: Diagnosis not present

## 2023-03-18 LAB — POCT INR: INR: 3.6 — AB (ref 2.0–3.0)

## 2023-03-18 NOTE — Progress Notes (Signed)
 Pt reported at last INR check he had been taking a lot of Advil for sinus pressure. Advised pt at that visit not to use ibuprofen due to major interaction. Pt said he was not aware. Pt denied any bleeding or abnormal bruising. Advised if any s/s of bleeding or abnormal bruising to go to the ER. Pt verbalized understanding.  Pt denies taking any Advil since last INR check. Pt does report he does not take warfarin the same time every day. He sometimes takes warfarin in the morning and sometimes in the evening. Advised to take warfarin every evening and not in the morning. Discussed with pt's wife, [place], who is now helping pt maintain medications and remembering to take them. Pt verbalized understanding.  Hold dose today and then change weekly dose to take 1 tablet daily except take 2 tablets on Mondays and Fridays. Recheck in 2 weeks.

## 2023-03-18 NOTE — Patient Instructions (Addendum)
 Pre visit review using our clinic review tool, if applicable. No additional management support is needed unless otherwise documented below in the visit note.  Hold dose today and then change weekly dose to take 1 tablet daily except take 2 tablets on Mondays and Fridays. Recheck in 2 weeks.

## 2023-04-02 ENCOUNTER — Telehealth: Payer: Self-pay

## 2023-04-02 NOTE — Telephone Encounter (Signed)
 Copied from CRM [id number]. Topic: Clinical - Medication Question >> Apr 02, 2023  1:55 PM Almira Coaster wrote: Reason for CRM: Patient had to reschedule his Coumadin appointment scheduled for 04/03/2023 due to being out of town, The wife is asking if there is anyway she can speak to [facility] to know how many pills a day she is able to give the patient. Best call back number would be [phone].

## 2023-04-03 ENCOUNTER — Ambulatory Visit: Payer: Medicare Other

## 2023-04-03 NOTE — Telephone Encounter (Signed)
 Unable to contact pt's wife so contacted pt and advised of dosing for the next week. Pt verbalized understanding.

## 2023-04-03 NOTE — Telephone Encounter (Signed)
 LVM

## 2023-04-10 ENCOUNTER — Ambulatory Visit (INDEPENDENT_AMBULATORY_CARE_PROVIDER_SITE_OTHER): Payer: Medicare Other

## 2023-04-10 DIAGNOSIS — Z7901 Long term (current) use of anticoagulants: Secondary | ICD-10-CM | POA: Diagnosis not present

## 2023-04-10 LAB — POCT INR: INR: 3.7 — AB (ref 2.0–3.0)

## 2023-04-10 NOTE — Progress Notes (Signed)
 Pt denies any changes and does not know why he is still supratherapeutic. He does report a reduced amount of vegetables lately.  Hold dose today and then change weekly dose to take 1 tablet daily except take 2 tablets on Mondays. Recheck in 2 weeks.  Advised pt he is due for annual physical and to contact office to make apt. Pt verbalized understanding.

## 2023-04-10 NOTE — Patient Instructions (Addendum)
Pre visit review using our clinic review tool, if applicable. No additional management support is needed unless otherwise documented below in the visit note.  Hold dose today and then change weekly dose to take 1 tablet daily except take 2 tablets on Mondays. Recheck in 2 weeks.

## 2023-04-24 ENCOUNTER — Ambulatory Visit: Admitting: Family Medicine

## 2023-04-24 ENCOUNTER — Ambulatory Visit (INDEPENDENT_AMBULATORY_CARE_PROVIDER_SITE_OTHER)

## 2023-04-24 DIAGNOSIS — Z7901 Long term (current) use of anticoagulants: Secondary | ICD-10-CM | POA: Diagnosis not present

## 2023-04-24 LAB — POCT INR: INR: 3.2 — AB (ref 2.0–3.0)

## 2023-04-24 NOTE — Patient Instructions (Addendum)
 Pre visit review using our clinic review tool, if applicable. No additional management support is needed unless otherwise documented below in the visit note.  Hold dose today and then change weekly dose to take 1 tablet daily except take 1 1/2  tablets on Mondays. Recheck in 3 weeks.

## 2023-04-24 NOTE — Progress Notes (Signed)
 Hold dose today and then change weekly dose to take 1 tablet daily except take 1 1/2  tablets on Mondays. Recheck in 3 weeks.

## 2023-05-14 ENCOUNTER — Other Ambulatory Visit: Payer: Self-pay | Admitting: Family Medicine

## 2023-05-14 DIAGNOSIS — I10 Essential (primary) hypertension: Secondary | ICD-10-CM

## 2023-05-15 ENCOUNTER — Telehealth: Payer: Self-pay | Admitting: Family Medicine

## 2023-05-15 ENCOUNTER — Ambulatory Visit

## 2023-05-15 NOTE — Telephone Encounter (Signed)
 Pt unable to come in today due to working out of town and [facility] coumadin appt to 05-22-2023 330 pm. Pt is aware will sent message to shannon to confirm ok to come in next week

## 2023-05-15 NOTE — Telephone Encounter (Signed)
 Noted.

## 2023-05-20 ENCOUNTER — Telehealth: Payer: Self-pay | Admitting: Family Medicine

## 2023-05-20 NOTE — Telephone Encounter (Signed)
 Talked to [place], pt's wife, who reports pt is out of town and needs to [facility] coumadin clinic apt. Advised it would be best to check as soon as possible since INR has been very labile. Advised pt could have a lab draw while out of town and then dosing instructions could be called to the pt. She reports she will talk to him and let him know it is best to come back next week to assure he can get his INR checked. Advised if he wants to do a lab out of town to contact this nurse so an order can be placed. Billie verbalized understanding. Provided Billie with diret number to coumadin clinic.

## 2023-05-20 NOTE — Telephone Encounter (Signed)
 Copied from CRM [id number]. Topic: Appointments - Scheduling Inquiry for Clinic >> May 20, 2023  2:05 PM Albertha Alosa wrote: Reason for CRM: Patient called in stating he needs to reschedule the Coumadin Clinic that was schedule for 04/16 as he is out of town would like to be schedule for 04/30, also would like a callback when that has been rescheduled

## 2023-05-22 ENCOUNTER — Ambulatory Visit

## 2023-05-29 ENCOUNTER — Ambulatory Visit (INDEPENDENT_AMBULATORY_CARE_PROVIDER_SITE_OTHER)

## 2023-05-29 DIAGNOSIS — Z7901 Long term (current) use of anticoagulants: Secondary | ICD-10-CM

## 2023-05-29 LAB — POCT INR: INR: 3.3 — AB (ref 2.0–3.0)

## 2023-05-29 NOTE — Patient Instructions (Addendum)
Pre visit review using our clinic review tool, if applicable. No additional management support is needed unless otherwise documented below in the visit note.  Hold dose today and then change weekly dose to take 1 tablet daily.  Re-check in 2 weeks.

## 2023-05-29 NOTE — Progress Notes (Signed)
Hold dose today and then change weekly dose to take 1 tablet daily. Recheck in 2 weeks.

## 2023-06-12 ENCOUNTER — Ambulatory Visit (INDEPENDENT_AMBULATORY_CARE_PROVIDER_SITE_OTHER)

## 2023-06-12 DIAGNOSIS — Z7901 Long term (current) use of anticoagulants: Secondary | ICD-10-CM | POA: Diagnosis not present

## 2023-06-12 LAB — POCT INR: INR: 3.5 — AB (ref 2.0–3.0)

## 2023-06-12 NOTE — Patient Instructions (Addendum)
 Pre visit review using our clinic review tool, if applicable. No additional management support is needed unless otherwise documented below in the visit note.  Hold dose today and then change weekly dose to take 1 tablet daily except take 1/2 tablet on Monday and Thursday. Recheck in 2 weeks.

## 2023-06-12 NOTE — Progress Notes (Signed)
 Pr revealed he has had some bilateral lower leg/foot edema for the last few months. This will increase INR and explain why pt's INR has been supratherapeutic for the last few months. Pt reports he is taking diuretic as needed but not as much as he should.  Hold dose today and then change weekly dose to take 1 tablet daily except take 1/2 tablet on Monday and Thursday. Recheck in 2 weeks.

## 2023-06-14 ENCOUNTER — Ambulatory Visit: Admitting: Cardiology

## 2023-06-26 ENCOUNTER — Ambulatory Visit

## 2023-07-10 ENCOUNTER — Ambulatory Visit (INDEPENDENT_AMBULATORY_CARE_PROVIDER_SITE_OTHER)

## 2023-07-10 DIAGNOSIS — Z7901 Long term (current) use of anticoagulants: Secondary | ICD-10-CM | POA: Diagnosis not present

## 2023-07-10 LAB — POCT INR: INR: 1.7 — AB (ref 2.0–3.0)

## 2023-07-10 NOTE — Progress Notes (Signed)
 Increase dose today to take 1 1/2 tablets and then continue 1 tablet daily except take 1/2 tablet on Monday and Thursday. Recheck in 1 weeks.

## 2023-07-10 NOTE — Patient Instructions (Addendum)
 Pre visit review using our clinic review tool, if applicable. No additional management support is needed unless otherwise documented below in the visit note.  Increase dose today to take 1 1/2 tablets and then continue 1 tablet daily except take 1/2 tablet on Monday and Thursday. Recheck in 1 weeks.

## 2023-07-17 ENCOUNTER — Ambulatory Visit

## 2023-07-17 DIAGNOSIS — Z7901 Long term (current) use of anticoagulants: Secondary | ICD-10-CM | POA: Diagnosis not present

## 2023-07-17 LAB — POCT INR: INR: 1.4 — AB (ref 2.0–3.0)

## 2023-07-17 NOTE — Patient Instructions (Addendum)
 Pre visit review using our clinic review tool, if applicable. No additional management support is needed unless otherwise documented below in the visit note.  Increase dose today to take 1 1/2 tablets and then change weekly dose to take 1 tablet daily except take 1/2 tablet on Thursday. Recheck in 1 weeks.

## 2023-07-17 NOTE — Progress Notes (Signed)
 Pt denies any changes and does not know why his INR is subtherapeutic. Increase dose today to take 1 1/2 tablets and then change weekly dose to take 1 tablet daily except take 1/2 tablet on Thursday. Recheck in 1 weeks.

## 2023-07-24 ENCOUNTER — Ambulatory Visit (INDEPENDENT_AMBULATORY_CARE_PROVIDER_SITE_OTHER)

## 2023-07-24 DIAGNOSIS — Z7901 Long term (current) use of anticoagulants: Secondary | ICD-10-CM | POA: Diagnosis not present

## 2023-07-24 LAB — POCT INR: INR: 1.5 — AB (ref 2.0–3.0)

## 2023-07-24 NOTE — Patient Instructions (Addendum)
 Pre visit review using our clinic review tool, if applicable. No additional management support is needed unless otherwise documented below in the visit note.  Increase dose today and tomorrow to take 1 1/2 tablets and then change weekly dose to take 1 tablet daily. Recheck in 1 weeks.

## 2023-07-24 NOTE — Progress Notes (Signed)
 Pt reported today that he has been taking a probiotic supplement and also a daily vitamin. He does not take them consistently and reports sometimes he takes them everyday for a week and then may forget for several day.  Probiotics can cause an increase in vitamin K in the digestive tract and this counteracts warfarin, reducing INR. Pt is unsure of the brand of daily vitamin he is taking but thinks it does not contain vitamin K. He is going to check the bottle and f/u with the coumadin  clinic.  Advised pt if he continues the probiotics it has to be taken consistently or it will have a major effect on his INR. Pt reports he is going to stop taking the probiotic and will make sure he is taking the daily vitamin consistently.  Advised importance of maintaining diet and vitamin K. Pt verbalized understanding.  These factors explain the hx of erratic INR values over the last several months.  Increase dose today and tomorrow to take 1 1/2 tablets and then change weekly dose to take 1 tablet daily. Recheck in 1 weeks.

## 2023-07-30 ENCOUNTER — Ambulatory Visit: Admitting: Family Medicine

## 2023-07-30 NOTE — Progress Notes (Deleted)
 PATIENT CHECK-IN and HEALTH RISK ASSESSMENT QUESTIONNAIRE:  -completed by phone/video for upcoming Medicare Preventive Visit  -Please select NOT IN PERSON for method of visit.  FIRST check to see if the patient completed the online questionnaire - if so, this can be found under the rooming tab, then go to the questionnaires tab. Some of the questions are the same and you can use the answers to complete all of the bold questions below before calling the patient. Question #s below: 6 (fall risk screening), under habits # 1, 2, 3, 8 and 9,  under everyday activities #2, 3 and 8.   Pre-Visit Check-in: 1)Vitals (height, wt, BP, etc) - record in vitals section for visit on day of visit Request home vitals (wt, BP, etc.) and enter into vitals, THEN update Vital Signs SmartPhrase below at the top of the HPI. See below.  2)Review and Update Medications, Allergies PMH, Surgeries, Social history in Epic 3)Hospitalizations in the last year with date/reason? ***  4)Review and Update Care Team (patient's specialists) in Epic 5) Complete PHQ9 in Epic  6) Complete Fall Screening in Epic 7)Review all Health Maintenance Due and order under PCP if not done.  Medicare Wellness Patient Questionnaire:  Answer theses question about your habits: How often do you have a drink containing alcohol?*** How many drinks containing alcohol do you have on a typical day when you are drinking?*** How often do you have six or more drinks on one occasion?*** Have you ever smoked?*** Quit date if applicable? ***  How many packs a day do/did you smoke? *** Do you use smokeless tobacco?*** Do you use an illicit drugs?*** On average, how many days per week do you engage in moderate to strenuous exercise (like a brisk walk)?*** On average, how many minutes do you engage in exercise at this level?*** Are you sexually active? ***Number of partners?*** Typical breakfast**** Typical lunch*** Typical dinner*** Typical  snacks:****  Beverages: ***  Answer theses question about your everyday activities: Can you perform most household chores?*** Are you deaf or have significant trouble hearing?*** Do you feel that you have a problem with memory?*** Do you feel safe at home?*** Last dentist visit?*** 8. Do you have any difficulty performing your everyday activities?*** Are you having any difficulty walking, taking medications on your own, and or difficulty managing daily home needs?*** Do you have difficulty walking or climbing stairs?*** Do you have difficulty dressing or bathing?*** Do you have difficulty doing errands alone such as visiting a doctor's office or shopping?*** Do you currently have any difficulty preparing food and eating?*** Do you currently have any difficulty using the toilet?*** Do you have any difficulty managing your finances?*** Do you have any difficulties with housekeeping of managing your housekeeping?***   Do you have Advanced Directives in place (Living Will, Healthcare Power or Attorney)? ***   Last eye Exam and location?***   Do you currently use prescribed or non-prescribed narcotic or opioid pain medications?***  Do you have a history or close family history of breast, ovarian, tubal or peritoneal cancer or a family member with BRCA (breast cancer susceptibility 1 and 2) gene mutations?  ***Request home vitals (wt, BP, etc.) and enter into vitals, THEN update Vital Signs SmartPhrase below at the top of the HPI. See below.   Nurse/Assistant Credentials/time stamp:   ----------------------------------------------------------------------------------------------------------------------------------------------------------------------------------------------------------------------  Because this visit was a virtual/telehealth visit, some criteria may be missing or patient reported. Any vitals not documented were not able to be obtained and vitals that have  been documented  are patient reported.    MEDICARE ANNUAL PREVENTIVE CARE VISIT WITH PROVIDER (Welcome to Medicare, initial annual wellness or annual wellness exam)  Virtual Visit via Video ***Phone Note  I connected with Jose Bonilla on 07/30/23  by phone *** a video enabled telemedicine application and verified that I am speaking with the correct person using two identifiers.  Location patient: home Location provider:work or home office Persons participating in the virtual visit: patient, provider  Concerns and/or follow up today:   See HM section in Epic for other details of completed HM.    ROS: negative for report of fevers, unintentional weight loss, vision changes, vision loss, hearing loss or change, chest pain, sob, hemoptysis, melena, hematochezia, hematuria, falls, bleeding or bruising, thoughts of suicide or self harm, memory loss  Patient-completed extensive health risk assessment - reviewed and discussed with the patient: See Health Risk Assessment completed with patient prior to the visit either above or in recent phone note. This was reviewed in detailed with the patient today and appropriate recommendations, orders and referrals were placed as needed per Summary below and patient instructions.   Review of Medical History: -PMH, PSH, Family History and current specialty and care providers reviewed and updated and listed below   Patient Care Team: Micheal Wolm ORN, MD as PCP - General (Family Medicine) Debera Jayson MATSU, MD as PCP - Cardiology (Cardiology) Liane Sharyne MATSU, [facility] (Inactive) as Pharmacist (Pharmacist) Livingston Rigg, MD (Inactive) as Consulting Physician (Dermatology)   Past Medical History:  Diagnosis Date   Chronic atrial fibrillation [facility])    Coumadin  therapy   CVA (cerebral infarction)    Possible history   Essential hypertension    Gout    Overweight(278.02)    Seasonal allergies    Secondary cardiomyopathy (HCC)    LVEF 40-45% June 2013    No past  surgical history on file.  Social History   Socioeconomic History   Marital status: Married    Spouse name: Not on file   Number of children: Not on file   Years of education: Not on file   Highest education level: Not on file  Occupational History   Not on file  Tobacco Use   Smoking status: Never   Smokeless tobacco: Never  Vaping Use   Vaping status: Never Used  Substance and Sexual Activity   Alcohol use: No    Alcohol/week: 0.0 standard drinks of alcohol    Comment: no use    Drug use: No   Sexual activity: Not on file  Other Topics Concern   Not on file  Social History Narrative   Lives with wife   Married x 47 years   Children; no children   Hobbies; garden   Social Drivers of Corporate investment banker Strain: Low Risk  (02/19/2022)   Overall Financial Resource Strain (CARDIA)    Difficulty of Paying Living Expenses: Not hard at all  Food Insecurity: No Food Insecurity (02/19/2022)   Hunger Vital Sign    Worried About Running Out of Food in the Last Year: Never true    Ran Out of Food in the Last Year: Never true  Transportation Needs: No Transportation Needs (02/19/2022)   PRAPARE - Administrator, Civil Service (Medical): No    Lack of Transportation (Non-Medical): No  Physical Activity: Inactive (02/19/2022)   Exercise Vital Sign    Days of Exercise per Week: 0 days    Minutes of Exercise per Session:  0 min  Stress: No Stress Concern Present (02/19/2022)   [employer] of Occupational Health - Occupational Stress Questionnaire    Feeling of Stress : Not at all  Social Connections: Socially Integrated (02/19/2022)   Social Connection and Isolation Panel    Frequency of Communication with Friends and Family: More than three times a week    Frequency of Social Gatherings with Friends and Family: More than three times a week    Attends Religious Services: More than 4 times per year    Active Member of [employer] or Organizations: Yes    Attends  Engineer, structural: More than 4 times per year    Marital Status: Married  Catering manager Violence: Not At Risk (02/19/2022)   Humiliation, Afraid, Rape, and Kick questionnaire    Fear of Current or Ex-Partner: No    Emotionally Abused: No    Physically Abused: No    Sexually Abused: No    Family History  Problem Relation Age of Onset   Heart disease Mother    Hypertension Father    Diabetes Paternal Aunt     Current Outpatient Medications on File Prior to Visit  Medication Sig Dispense Refill   allopurinol  (ZYLOPRIM ) 300 MG tablet Take 1 tablet by mouth once daily 30 tablet 5   atenolol  (TENORMIN ) 100 MG tablet Take 1/2 (one-half) tablet by mouth twice daily 90 tablet 0   diltiazem  (CARTIA  XT) 180 MG 24 hr capsule Take 1 capsule by mouth once daily 90 capsule 3   ibuprofen (ADVIL) 200 MG tablet Take 200 mg by mouth every 6 (six) hours as needed.     lisinopril  (ZESTRIL ) 40 MG tablet Take 1 tablet by mouth once daily 90 tablet 3   Multiple Vitamin (MULTIVITAMIN) capsule Take 1 capsule by mouth daily.     warfarin (COUMADIN ) 2.5 MG tablet TAKE 2 TABLET BY MOUTH DAILY EXCEPT  TAKE  1 TABLET ON MONDAY, WEDNESDAY AND FRIDAY OR  AS  DIRECTED  BY  ANTICOAGULATION  CLINIC 165 tablet 1   No current facility-administered medications on file prior to visit.    Allergies  Allergen Reactions   Indomethacin  Other (See Comments)    Increased heart rate and elevated BP per patient       Physical Exam Vitals requested from patient and listed below if patient had equipment and was able to obtain at home for this virtual visit: There were no vitals filed for this visit. Estimated body mass index is 32.3 kg/m as calculated from the following:   Height as of 01/09/23: 5' 8 (1.727 m).   Weight as of 01/09/23: 212 lb 6.4 oz (96.3 kg).  EKG (optional): deferred due to virtual visit  GENERAL: alert, oriented, no acute distress detected; full vision exam deferred due to pandemic  and/or virtual encounter  ***  HEENT: atraumatic, conjunttiva clear, no obvious abnormalities on inspection of external nose and ears  NECK: normal movements of the head and neck  LUNGS: on inspection no signs of respiratory distress, breathing rate appears normal, no obvious gross SOB, gasping or wheezing  CV: no obvious cyanosis  MS: moves all visible extremities without noticeable abnormality  PSYCH/NEURO: pleasant and cooperative, no obvious depression or anxiety, speech and thought processing grossly intact, Cognitive function grossly intact  Flowsheet Row Clinical Support from 12/18/2019 in [facility] [place]  PHQ-9 Total Score 0        02/19/2022    2:06 PM 01/03/2021  4:13 PM 12/18/2019    3:52 PM 11/15/2017    8:36 AM 03/14/2016    5:04 PM  Depression screen PHQ 2/9  Decreased Interest 0 0 0 0 0  Down, Depressed, Hopeless 0 0 0 0 0  PHQ - 2 Score 0 0 0 0 0  Altered sleeping   0    Tired, decreased energy   0    Change in appetite   0    Feeling bad or failure about yourself    0    Trouble concentrating   0    Moving slowly or fidgety/restless   0    Suicidal thoughts   0    PHQ-9 Score   0    Difficult doing work/chores   Not difficult at all         03/14/2016    5:04 PM 11/15/2017    8:36 AM 12/18/2019    3:42 PM 01/03/2021    4:12 PM 02/19/2022    2:09 PM  Fall Risk  Falls in the past year? No  No  1 0 0  Was there an injury with Fall?   1  0  Was there an injury with Fall? - Comments   had a concussion    Fall Risk Category Calculator   2  0  Fall Risk Category (Retired)   Moderate     (RETIRED) Patient Fall Risk Level   Low fall risk  Low fall risk    Patient at Risk for Falls Due to   No Fall Risks Medication side effect No Fall Risks  Fall risk Follow up   Falls evaluation completed;Falls prevention discussed  Falls evaluation completed;Education provided;Falls prevention discussed  Falls prevention discussed      Data  saved with a previous flowsheet row definition     SUMMARY AND PLAN:  No diagnosis found.  Visit coding *** [phone] (annual wellness visit -initial); G0439 (annual wellness subsequent); G0402 Welcome to Medicare(initial preventive physical exam)   Discussed applicable health maintenance/preventive health measures and advised and referred or ordered per patient preferences:  Health Maintenance  Topic Date Due   DTaP/Tdap/Td (1 - Tdap) Never done   Zoster Vaccines- Shingrix (1 of 2) Never done   Pneumococcal Vaccine: 50+ Years (1 of 1 - PCV) Never done   COVID-19 Vaccine (3 - Moderna risk series) 03/01/2020   Medicare Annual Wellness (AWV)  02/20/2023   INFLUENZA VACCINE  09/06/2023   Hepatitis C Screening  Completed   Hepatitis B Vaccines  Aged Out   HPV VACCINES  Aged Out   Meningococcal B Vaccine  Aged Out   Colonoscopy  Discontinued     Education and counseling on the following was provided based on the above review of health and a plan/checklist for the patient, along with additional information discussed, was provided for the patient in the patient instructions :  -Advised on importance of completing advanced directives, discussed options for completing and provided information in patient instructions as well -Provided counseling and plan for difficulty hearing  -Provided counseling and plan for increased risk of falling if applicable per above screening. Reviewed and demonstrated safe balance exercises that can be done at home to improve balance and discussed exercise guidelines for adults with include balance exercises at least 3 days per week.  -Advised and counseled on a healthy lifestyle - including the importance of a healthy diet, regular physical activity, social connections and stress management. -Reviewed patient's current diet. Advised and counseled  on a whole foods based healthy diet. A summary of a healthy diet was provided in the Patient Instructions.  -reviewed  patient's current physical activity level and discussed exercise guidelines for adults. Discussed community resources and ideas for safe exercise at home to assist in meeting exercise guideline recommendations in a safe and healthy way.  -Advise yearly dental visits at minimum and regular eye exams -Advised and counseled on alcohol safe limits, risks/ tobacco use, risks of smoking and offered counseling/help, drug, opoid use/misuse   Follow up: see patient instructions   There are no Patient Instructions on file for this visit.  Chiquita JONELLE Cramp, DO

## 2023-07-31 ENCOUNTER — Ambulatory Visit (INDEPENDENT_AMBULATORY_CARE_PROVIDER_SITE_OTHER)

## 2023-07-31 DIAGNOSIS — Z7901 Long term (current) use of anticoagulants: Secondary | ICD-10-CM | POA: Diagnosis not present

## 2023-07-31 LAB — POCT INR: INR: 2.6 (ref 2.0–3.0)

## 2023-07-31 MED ORDER — WARFARIN SODIUM 2.5 MG PO TABS: ORAL_TABLET | ORAL | 1 refills | Status: DC

## 2023-07-31 NOTE — Progress Notes (Signed)
 Pt was advised at last coumadin  clinic apt to stop probiotic and pt has stopped taking probiotic. Probiotic will increase vitamin K levels in GI tract and decrease INR. Pt has been taking his daily vitamin, 70 mcg vitamin K, consistently.  Continue 1 tablet daily. Recheck in 4 weeks.  Pt is compliant with warfarin management and PCP apts.  Sent in refill of warfarin to requested pharmacy.

## 2023-07-31 NOTE — Patient Instructions (Addendum)
Pre visit review using our clinic review tool, if applicable. No additional management support is needed unless otherwise documented below in the visit note.  Continue 1 tablet daily . Recheck in 4 weeks.  

## 2023-08-02 ENCOUNTER — Other Ambulatory Visit: Payer: Self-pay | Admitting: Family Medicine

## 2023-08-02 DIAGNOSIS — Z7901 Long term (current) use of anticoagulants: Secondary | ICD-10-CM

## 2023-08-02 NOTE — Telephone Encounter (Signed)
 Refill was sent on 6/25. This is a duplicate request.  Refill rejected.

## 2023-08-09 ENCOUNTER — Other Ambulatory Visit: Payer: Self-pay | Admitting: Family Medicine

## 2023-08-09 DIAGNOSIS — I10 Essential (primary) hypertension: Secondary | ICD-10-CM

## 2023-08-16 ENCOUNTER — Other Ambulatory Visit: Payer: Self-pay | Admitting: Physician Assistant

## 2023-08-27 ENCOUNTER — Telehealth: Payer: Self-pay | Admitting: Family Medicine

## 2023-08-27 NOTE — Telephone Encounter (Signed)
 Received msg that pt's wife was trying to call back. Unable to take call at time.   LVM to call direct number to coumadin  clinic.

## 2023-08-27 NOTE — Telephone Encounter (Signed)
 Copied from CRM [id number]. Topic: Appointments - Appointment Cancel/Reschedule >> Aug 27, 2023 12:58 PM Harlene ORN wrote: Patient/patient representative is calling to reschedule his coumadin  appointment to a week from the appointment time. Please call back patient to reschedule.

## 2023-08-27 NOTE — Telephone Encounter (Signed)
 LVM

## 2023-08-27 NOTE — Telephone Encounter (Unsigned)
 Copied from CRM [id number]. Topic: Appointments - Appointment Cancel/Reschedule >> Aug 27, 2023 12:58 PM Harlene ORN wrote: Patient/patient representative is calling to reschedule his coumadin  appointment to a week from the appointment time. Please call back patient to reschedule. >> Aug 27, 2023  3:43 PM [place] J wrote: Patinet's wife returning a callback from [place] in regards to scheduling. I tried to call CAL to transfer the patient but shannon was not available.

## 2023-08-28 ENCOUNTER — Ambulatory Visit

## 2023-08-28 NOTE — Telephone Encounter (Signed)
 Contacted pt and RS coumadin  clinic apt for next week per request due to pt working out of town.

## 2023-09-04 ENCOUNTER — Ambulatory Visit (INDEPENDENT_AMBULATORY_CARE_PROVIDER_SITE_OTHER)

## 2023-09-04 DIAGNOSIS — Z7901 Long term (current) use of anticoagulants: Secondary | ICD-10-CM | POA: Diagnosis not present

## 2023-09-04 LAB — POCT INR: INR: 1.9 — AB (ref 2.0–3.0)

## 2023-09-04 NOTE — Progress Notes (Signed)
 Pt reports eating more salads the past few weeks.  Increase dose today to take 1 1/2 tablets and then continue 1 tablet daily. Recheck in 3 weeks.

## 2023-09-04 NOTE — Patient Instructions (Addendum)
Pre visit review using our clinic review tool, if applicable. No additional management support is needed unless otherwise documented below in the visit note.  Increase dose today to take 1 1/2 tablets and then continue 1 tablet daily. Recheck in 3 weeks.

## 2023-09-06 ENCOUNTER — Other Ambulatory Visit: Payer: Self-pay | Admitting: Cardiology

## 2023-09-24 ENCOUNTER — Ambulatory Visit: Admitting: Cardiology

## 2023-09-25 ENCOUNTER — Ambulatory Visit

## 2023-09-27 ENCOUNTER — Encounter: Payer: Self-pay | Admitting: Cardiology

## 2023-09-27 ENCOUNTER — Telehealth: Payer: Self-pay

## 2023-09-27 ENCOUNTER — Ambulatory Visit: Attending: Cardiology | Admitting: Cardiology

## 2023-09-27 VITALS — BP 138/84 | HR 75 | Ht 68.0 in | Wt 202.0 lb

## 2023-09-27 DIAGNOSIS — I5032 Chronic diastolic (congestive) heart failure: Secondary | ICD-10-CM | POA: Diagnosis not present

## 2023-09-27 DIAGNOSIS — I10 Essential (primary) hypertension: Secondary | ICD-10-CM | POA: Diagnosis not present

## 2023-09-27 DIAGNOSIS — I4821 Permanent atrial fibrillation: Secondary | ICD-10-CM | POA: Insufficient documentation

## 2023-09-27 NOTE — Patient Instructions (Signed)
 Medication Instructions:  Your physician recommends that you continue on your current medications as directed. Please refer to the Current Medication list given to you today.   Labwork: None today  Testing/Procedures: None today  Follow-Up: 1 year  Any Other Special Instructions Will Be Listed Below (If Applicable).  If you need a refill on your cardiac medications before your next appointment, please call your pharmacy.

## 2023-09-27 NOTE — Progress Notes (Signed)
    Cardiology Office Note  Date: 09/27/2023   ID: Broxton Broady, DOB 1945-07-17, MRN [id number]  History of Present Illness: Jose Bonilla is a 78 y.o. male last seen in April 2024.  He is here for a routine visit.  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  He does not report any specific change in stamina, no exertional chest pain, no recurring sense of palpitations.  No dizziness or syncope.  We went over his medications.  He continues on Coumadin  with follow-up by PCP.  He does not report any spontaneous bleeding problems.  Remainder of his medications are stable as well.  I reviewed his interval lab work.  I reviewed his ECG today which shows atrial fibrillation with R in lead V1 and decreased R wave progression.  Physical Exam: VS:  BP 138/84 (BP Location: Right Arm, Cuff Size: Large)   Pulse 75   Ht 5' 8 (1.727 m)   Wt 202 lb (91.6 kg)   SpO2 97%   BMI 30.71 kg/m , BMI Body mass index is 30.71 kg/m.  Wt Readings from Last 3 Encounters:  09/27/23 202 lb (91.6 kg)  01/09/23 212 lb 6.4 oz (96.3 kg)  05/31/22 205 lb 3.2 oz (93.1 kg)    General: Patient appears comfortable at rest. HEENT: Conjunctiva and lids normal. Neck: Supple, no elevated JVP or carotid bruits. Lungs: Clear to auscultation, nonlabored breathing at rest. Cardiac: Irregularly irregular, 1/6 systolic murmur without gallop. Extremities: No pitting edema.  ECG:  An ECG dated 05/31/2022 was personally reviewed today and demonstrated:  Atrial fibrillation with PVC.  Labwork: 01/09/2023: ALT 13; AST 17; BUN 16; Creatinine, Ser 0.96; Hemoglobin 13.6; Platelets 176.0; Potassium 4.1; Sodium 138     Component Value Date/Time   CHOL 155 03/15/2021 1503   TRIG 188.0 (H) 03/15/2021 1503   HDL 37.60 (L) 03/15/2021 1503   CHOLHDL 4 03/15/2021 1503   VLDL 37.6 03/15/2021 1503   LDLCALC 79 03/15/2021 1503   LDLDIRECT 67.0 08/16/2014 1517   Other  Studies Reviewed Today:  No interval cardiac testing for review today.  Assessment and Plan:  1.  Permanent atrial fibrillation with CHA2DS2-VASc score of 5.  He is doing well with no progressive palpitations, continues on Coumadin  for stroke prophylaxis which is followed by his PCP.  ECG reviewed.  Continue atenolol  50 mg twice daily and cardia XT 180 mg daily.   2.  HFrecEF with  history of nonischemic cardiomyopathy.  Echocardiogram in December 2021 revealed LVEF 60 to 65%.   3.  Primary hypertension.  Continue lisinopril  40 mg daily.  Disposition:  Follow up 1 year.  Signed, Jose Bonilla, M.D., F.A.C.C. [facility]

## 2023-09-27 NOTE — Telephone Encounter (Signed)
 Pt  cancelled coumadin  clinic apt for 8/20.  Contacted pt to RS. RS apt for 8/27.

## 2023-10-02 ENCOUNTER — Ambulatory Visit (INDEPENDENT_AMBULATORY_CARE_PROVIDER_SITE_OTHER)

## 2023-10-02 DIAGNOSIS — Z7901 Long term (current) use of anticoagulants: Secondary | ICD-10-CM

## 2023-10-02 LAB — POCT INR: INR: 2.3 (ref 2.0–3.0)

## 2023-10-02 MED ORDER — WARFARIN SODIUM 2.5 MG PO TABS: ORAL_TABLET | ORAL | 1 refills | Status: DC

## 2023-10-02 NOTE — Progress Notes (Signed)
 Continue 1 tablet daily. Recheck in 4 weeks. Pt is compliant with warfarin management and PCP apts.  Sent in refill of warfarin to requested pharmacy.

## 2023-10-02 NOTE — Patient Instructions (Addendum)
Pre visit review using our clinic review tool, if applicable. No additional management support is needed unless otherwise documented below in the visit note.  Continue 1 tablet daily . Recheck in 4 weeks.  

## 2023-10-30 ENCOUNTER — Ambulatory Visit

## 2023-11-01 ENCOUNTER — Telehealth: Payer: Self-pay

## 2023-11-01 NOTE — Telephone Encounter (Signed)
 Pt NS coumadin  clinic apt on 9/24. It appears pt has RS online for an apt 2 weeks further out. Pt is consistently noncompliant with coumadin  clinic apts. Discussions concerning the importance of adherence were made with pt and his wife.

## 2023-11-12 ENCOUNTER — Other Ambulatory Visit: Payer: Self-pay | Admitting: Cardiology

## 2023-11-12 ENCOUNTER — Other Ambulatory Visit: Payer: Self-pay | Admitting: Family Medicine

## 2023-11-12 DIAGNOSIS — I10 Essential (primary) hypertension: Secondary | ICD-10-CM

## 2023-11-13 ENCOUNTER — Ambulatory Visit (INDEPENDENT_AMBULATORY_CARE_PROVIDER_SITE_OTHER)

## 2023-11-13 DIAGNOSIS — Z7901 Long term (current) use of anticoagulants: Secondary | ICD-10-CM | POA: Diagnosis not present

## 2023-11-13 LAB — POCT INR: INR: 1.8 — AB (ref 2.0–3.0)

## 2023-11-13 NOTE — Patient Instructions (Addendum)
 Pre visit review using our clinic review tool, if applicable. No additional management support is needed unless otherwise documented below in the visit note.  Increase dose today to take 1 1/2 tablets and then change weekly dose to take 1 tablet daily except take 1 1/2 tablets on Mondays. Recheck in 2 weeks.

## 2023-11-13 NOTE — Progress Notes (Signed)
 Pt is two weeks late for INR check. Pt has RS last apt made by coumadin  clinic. He does not contact coumadin  clinic to RS these apts.  Pt reports eating a lot of fresh turnips. He will continue to eat these until the crop is completed, which is usually several months. Due to this there will be a small change to dosing to cover for the vitamin K interaction.  Increase dose today to take 1 1/2 tablets and then change weekly dose to take 1 tablet daily except take 1 1/2 tablets on Mondays. Recheck in 2 weeks.

## 2023-11-27 ENCOUNTER — Ambulatory Visit

## 2023-11-27 DIAGNOSIS — Z7901 Long term (current) use of anticoagulants: Secondary | ICD-10-CM

## 2023-11-27 LAB — POCT INR: INR: 3 (ref 2.0–3.0)

## 2023-11-27 NOTE — Progress Notes (Signed)
 Indication: permanent Afib Continue 1 tablet daily except take 1 1/2 tablets on Mondays. Recheck in 4 weeks.

## 2023-11-27 NOTE — Patient Instructions (Addendum)
Pre visit review using our clinic review tool, if applicable. No additional management support is needed unless otherwise documented below in the visit note.  Continue 1 tablet daily except take 1 1/2 tablets on Mondays. Recheck in 4 weeks.  

## 2023-12-25 ENCOUNTER — Ambulatory Visit

## 2023-12-25 DIAGNOSIS — Z7901 Long term (current) use of anticoagulants: Secondary | ICD-10-CM | POA: Diagnosis not present

## 2023-12-25 LAB — POCT INR: INR: 2.4 (ref 2.0–3.0)

## 2023-12-25 NOTE — Progress Notes (Signed)
 Indication: permanent Afib Continue 1 tablet daily except take 1 1/2 tablets on Mondays. Recheck in 4 weeks.

## 2023-12-25 NOTE — Patient Instructions (Addendum)
Pre visit review using our clinic review tool, if applicable. No additional management support is needed unless otherwise documented below in the visit note.  Continue 1 tablet daily except take 1 1/2 tablets on Mondays. Recheck in 4 weeks.  

## 2024-01-22 ENCOUNTER — Encounter: Payer: Self-pay | Admitting: Family Medicine

## 2024-01-22 ENCOUNTER — Ambulatory Visit

## 2024-01-22 ENCOUNTER — Ambulatory Visit: Admitting: Family Medicine

## 2024-01-22 VITALS — BP 108/86 | HR 78 | Temp 97.4°F | Wt 205.4 lb

## 2024-01-22 DIAGNOSIS — M1A9XX1 Chronic gout, unspecified, with tophus (tophi): Secondary | ICD-10-CM

## 2024-01-22 DIAGNOSIS — I4891 Unspecified atrial fibrillation: Secondary | ICD-10-CM | POA: Diagnosis not present

## 2024-01-22 DIAGNOSIS — Z7901 Long term (current) use of anticoagulants: Secondary | ICD-10-CM

## 2024-01-22 DIAGNOSIS — I10 Essential (primary) hypertension: Secondary | ICD-10-CM

## 2024-01-22 LAB — POCT INR: INR: 3.2 — AB (ref 2.0–3.0)

## 2024-01-22 NOTE — Patient Instructions (Addendum)
 Pre visit review using our clinic review tool, if applicable. No additional management support is needed unless otherwise documented below in the visit note.  Hold warfarin today and then continue 1 tablet daily except take 1 1/2 tablets on Mondays. Recheck in 3 weeks.

## 2024-01-22 NOTE — Patient Instructions (Signed)
 Remember to schedule and get labs in 3 weeks when you come back to see [facility]

## 2024-01-22 NOTE — Progress Notes (Signed)
 Established Patient Office Visit  Subjective   Patient ID: Jose Bonilla, male    DOB: 04-Oct-1945  Age: 78 y.o. MRN: [id number]  Chief Complaint  Patient presents with   Medical Management of Chronic Issues    HPI    Jose Bonilla is here for routine medical follow-up.  His chronic problems include history of atrial fibrillation, hypertension, questionable history of prior CVA, history of gout, chronic Coumadin  therapy.  We reviewed his medications.  Is not clear whether he is taking allopurinol .  He seemed unsure.  He states he is taking atenolol , diltiazem , and lisinopril .  No recent dizziness or chest pains.  He has chronic atrial fibrillation but is asymptomatic for the most part.  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  Discussed immunizations including influenza and he declines  Past Medical History:  Diagnosis Date   Chronic atrial fibrillation (HCC)    Coumadin  therapy   CVA (cerebral infarction)    Possible history   Essential hypertension    Gout    Overweight(278.02)    Seasonal allergies    Secondary cardiomyopathy (HCC)    LVEF 40-45% June 2013   History reviewed. No pertinent surgical history.  reports that he has never smoked. He has never used smokeless tobacco. He reports that he does not drink alcohol and does not use drugs. family history includes Diabetes in his paternal aunt; Heart disease in his mother; Hypertension in his father. Allergies[1]  Review of Systems  Constitutional:  Negative for malaise/fatigue.  Eyes:  Negative for blurred vision.  Respiratory:  Negative for shortness of breath.   Cardiovascular:  Negative for chest pain.  Neurological:  Negative for dizziness, weakness and headaches.      Objective:     BP 108/86   Pulse 78   Temp (!) 97.4 F (36.3 C) (Oral)   Wt 205 lb 6.4 oz (93.2 kg)   SpO2 95%   BMI 31.23 kg/m  BP Readings from Last 3 Encounters:  01/22/24 108/86  09/27/23  138/84  01/09/23 128/78   Wt Readings from Last 3 Encounters:  01/22/24 205 lb 6.4 oz (93.2 kg)  09/27/23 202 lb (91.6 kg)  01/09/23 212 lb 6.4 oz (96.3 kg)      Physical Exam Vitals reviewed.  Constitutional:      General: He is not in acute distress.    Appearance: He is well-developed.  HENT:     Right Ear: External ear normal.     Left Ear: External ear normal.  Eyes:     Pupils: Pupils are equal, round, and reactive to light.  Neck:     Thyroid : No thyromegaly.  Cardiovascular:     Rate and Rhythm: Normal rate.     Comments: Irregular rhythm consistent with his atrial fibrillation Pulmonary:     Effort: Pulmonary effort is normal. No respiratory distress.     Breath sounds: Normal breath sounds. No wheezing or rales.  Musculoskeletal:     Cervical back: Neck supple.  Neurological:     Mental Status: He is alert and oriented to person, place, and time.      Results for orders placed or performed in visit on 01/22/24  POCT INR  Result Value Ref Range   INR 3.2 (A) 2.0 - 3.0    Last CBC Lab Results  Component Value Date   WBC 6.2 01/09/2023   HGB 13.6 01/09/2023   HCT 42.4 01/09/2023   MCV 88.9 01/09/2023  MCH 28.5 09/18/2021   RDW 17.1 (H) 01/09/2023   PLT 176.0 01/09/2023   Last metabolic panel Lab Results  Component Value Date   GLUCOSE 87 01/09/2023   NA 138 01/09/2023   K 4.1 01/09/2023   CL 104 01/09/2023   CO2 29 01/09/2023   BUN 16 01/09/2023   CREATININE 0.96 01/09/2023   GFR 75.56 01/09/2023   CALCIUM 8.7 01/09/2023   PROT 6.2 01/09/2023   ALBUMIN 3.8 01/09/2023   BILITOT 0.8 01/09/2023   ALKPHOS 74 01/09/2023   AST 17 01/09/2023   ALT 13 01/09/2023   ANIONGAP 6 09/18/2021   Last lipids Lab Results  Component Value Date   CHOL 155 03/15/2021   HDL 37.60 (L) 03/15/2021   LDLCALC 79 03/15/2021   LDLDIRECT 67.0 08/16/2014   TRIG 188.0 (H) 03/15/2021   CHOLHDL 4 03/15/2021   Last hemoglobin A1c Lab Results  Component Value  Date   HGBA1C 5.8 01/09/2023      The ASCVD Risk score (Arnett DK, et al., 2019) failed to calculate for the following reasons:   Risk score cannot be calculated because patient has a medical history suggesting prior/existing ASCVD   * - Cholesterol units were assumed    Assessment & Plan:   #1 chronic atrial fibrillation.  Rate controlled.  On Coumadin .  Continue close follow-up with Coumadin  clinic.  Continue current medications for rate control.  #2 history of gout.  Patient uncertain where he is taking allopurinol .  He is asked to confirm this with home medications.  Denies recent acute gout flare  #3 hypertension currently well-controlled with lisinopril , atenolol , and diltiazem  Her lab was closed at time of his visit today.  We have recommended future labs with CBC and CMP with next Coumadin  check in a few weeks  No follow-ups on file.    Wolm Scarlet, MD     [1]  Allergies Allergen Reactions   Indomethacin  Other (See Comments)    Increased heart rate and elevated BP per patient

## 2024-01-22 NOTE — Progress Notes (Signed)
 Indication: permanent Afib Pt also has apt with PCP today. Pt has been eating pineapple and grapefruit for the last 5 days. This will increase INR. Educated pt of this interaction. Pt verbalized understanding.  Hold warfarin today and then continue 1 tablet daily except take 1 1/2 tablets on Mondays. Recheck in 3 weeks.

## 2024-02-11 ENCOUNTER — Telehealth: Payer: Self-pay

## 2024-02-11 NOTE — Telephone Encounter (Signed)
 Pt reports he needs to [facility] coumadin  clinic apt for tomorrow to next week due to having to go out of town.  RS apt for next week. Advised, per PCP, pt will also need labs. Pt appreciative and verbalized understanding.

## 2024-02-12 ENCOUNTER — Ambulatory Visit

## 2024-02-19 ENCOUNTER — Other Ambulatory Visit (INDEPENDENT_AMBULATORY_CARE_PROVIDER_SITE_OTHER)

## 2024-02-19 ENCOUNTER — Ambulatory Visit

## 2024-02-19 ENCOUNTER — Other Ambulatory Visit: Payer: Self-pay

## 2024-02-19 DIAGNOSIS — R739 Hyperglycemia, unspecified: Secondary | ICD-10-CM

## 2024-02-19 DIAGNOSIS — Z7901 Long term (current) use of anticoagulants: Secondary | ICD-10-CM

## 2024-02-19 DIAGNOSIS — I10 Essential (primary) hypertension: Secondary | ICD-10-CM

## 2024-02-19 LAB — POCT INR: INR: 2.8 (ref 2.0–3.0)

## 2024-02-19 MED ORDER — WARFARIN SODIUM 2.5 MG PO TABS: ORAL_TABLET | ORAL | 1 refills | Status: AC

## 2024-02-19 NOTE — Progress Notes (Addendum)
 Indication: permanent Afib Pt also has a lab apt today. Continue 1 tablet daily except take 1 1/2 tablets on Mondays. Recheck in 4 weeks.  Pt is compliant with warfarin management and PCP apts.  Sent in refill of warfarin to requested pharmacy.

## 2024-02-19 NOTE — Patient Instructions (Addendum)
Pre visit review using our clinic review tool, if applicable. No additional management support is needed unless otherwise documented below in the visit note.  Continue 1 tablet daily except take 1 1/2 tablets on Mondays. Recheck in 4 weeks.  

## 2024-02-19 NOTE — Addendum Note (Signed)
 Addended by: ETHYL KIRSCH A on: 02/19/2024 04:12 PM   Modules accepted: Orders

## 2024-02-20 ENCOUNTER — Ambulatory Visit: Payer: Self-pay | Admitting: Family Medicine

## 2024-02-20 LAB — CBC WITH DIFFERENTIAL/PLATELET
Basophils Absolute: 0.1 K/uL (ref 0.0–0.1)
Basophils Relative: 1.1 % (ref 0.0–3.0)
Eosinophils Absolute: 0.3 K/uL (ref 0.0–0.7)
Eosinophils Relative: 5.9 % — ABNORMAL HIGH (ref 0.0–5.0)
HCT: 41.7 % (ref 39.0–52.0)
Hemoglobin: 13.9 g/dL (ref 13.0–17.0)
Lymphocytes Relative: 25.2 % (ref 12.0–46.0)
Lymphs Abs: 1.4 K/uL (ref 0.7–4.0)
MCHC: 33.4 g/dL (ref 30.0–36.0)
MCV: 85.7 fl (ref 78.0–100.0)
Monocytes Absolute: 0.5 K/uL (ref 0.1–1.0)
Monocytes Relative: 9.4 % (ref 3.0–12.0)
Neutro Abs: 3.3 K/uL (ref 1.4–7.7)
Neutrophils Relative %: 58.4 % (ref 43.0–77.0)
Platelets: 138 K/uL — ABNORMAL LOW (ref 150.0–400.0)
RBC: 4.87 Mil/uL (ref 4.22–5.81)
RDW: 17 % — ABNORMAL HIGH (ref 11.5–15.5)
WBC: 5.7 K/uL (ref 4.0–10.5)

## 2024-02-20 LAB — COMPREHENSIVE METABOLIC PANEL WITH GFR
ALT: 13 U/L (ref 3–53)
AST: 17 U/L (ref 5–37)
Albumin: 4 g/dL (ref 3.5–5.2)
Alkaline Phosphatase: 61 U/L (ref 39–117)
BUN: 18 mg/dL (ref 6–23)
CO2: 26 meq/L (ref 19–32)
Calcium: 9 mg/dL (ref 8.4–10.5)
Chloride: 104 meq/L (ref 96–112)
Creatinine, Ser: 1.05 mg/dL (ref 0.40–1.50)
GFR: 67.33 mL/min
Glucose, Bld: 74 mg/dL (ref 70–99)
Potassium: 4.1 meq/L (ref 3.5–5.1)
Sodium: 137 meq/L (ref 135–145)
Total Bilirubin: 0.6 mg/dL (ref 0.2–1.2)
Total Protein: 6.9 g/dL (ref 6.0–8.3)

## 2024-02-20 LAB — HEMOGLOBIN A1C: Hgb A1c MFr Bld: 5.8 % (ref 4.6–6.5)

## 2024-03-18 ENCOUNTER — Ambulatory Visit
